# Patient Record
Sex: Male | Born: 1940 | Race: White | Hispanic: No | Marital: Married | Smoking: Current some day smoker
Health system: Southern US, Community
[De-identification: ages and names within clinical notes are randomized; demographics above are authoritative.]

## PROBLEM LIST (undated history)

## (undated) DIAGNOSIS — I219 Acute myocardial infarction, unspecified: Secondary | ICD-10-CM

## (undated) DIAGNOSIS — I10 Essential (primary) hypertension: Secondary | ICD-10-CM

## (undated) DIAGNOSIS — M810 Age-related osteoporosis without current pathological fracture: Secondary | ICD-10-CM

## (undated) DIAGNOSIS — H269 Unspecified cataract: Secondary | ICD-10-CM

## (undated) DIAGNOSIS — T7840XA Allergy, unspecified, initial encounter: Secondary | ICD-10-CM

## (undated) DIAGNOSIS — J449 Chronic obstructive pulmonary disease, unspecified: Secondary | ICD-10-CM

## (undated) HISTORY — DX: Chronic obstructive pulmonary disease, unspecified: J44.9

## (undated) HISTORY — PX: ANGIOPLASTY: SHX39

## (undated) HISTORY — PX: EYE SURGERY: SHX253

## (undated) HISTORY — DX: Unspecified cataract: H26.9

## (undated) HISTORY — DX: Age-related osteoporosis without current pathological fracture: M81.0

## (undated) HISTORY — DX: Acute myocardial infarction, unspecified: I21.9

## (undated) HISTORY — DX: Allergy, unspecified, initial encounter: T78.40XA

## (undated) HISTORY — DX: Essential (primary) hypertension: I10

---
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
## 2004-02-07 ENCOUNTER — Emergency Department: Payer: Self-pay | Admitting: Internal Medicine

## 2004-02-07 ENCOUNTER — Other Ambulatory Visit: Payer: Self-pay

## 2007-07-09 DIAGNOSIS — Z720 Tobacco use: Secondary | ICD-10-CM | POA: Insufficient documentation

## 2007-11-18 ENCOUNTER — Other Ambulatory Visit: Payer: Self-pay

## 2007-11-18 ENCOUNTER — Inpatient Hospital Stay: Payer: Self-pay | Admitting: Internal Medicine

## 2008-02-02 ENCOUNTER — Ambulatory Visit: Payer: Self-pay | Admitting: Gastroenterology

## 2009-04-03 ENCOUNTER — Ambulatory Visit: Payer: Self-pay | Admitting: Family Medicine

## 2009-04-12 ENCOUNTER — Ambulatory Visit: Payer: Self-pay | Admitting: Family Medicine

## 2009-04-26 ENCOUNTER — Ambulatory Visit: Payer: Self-pay | Admitting: Gastroenterology

## 2009-04-27 ENCOUNTER — Ambulatory Visit: Payer: Self-pay | Admitting: Gastroenterology

## 2009-05-02 ENCOUNTER — Ambulatory Visit: Payer: Self-pay | Admitting: Gastroenterology

## 2012-09-30 ENCOUNTER — Ambulatory Visit: Payer: Self-pay | Admitting: Family Medicine

## 2012-10-02 ENCOUNTER — Ambulatory Visit: Payer: Self-pay | Admitting: Family Medicine

## 2012-11-10 ENCOUNTER — Emergency Department: Payer: Self-pay | Admitting: Emergency Medicine

## 2012-11-10 LAB — BASIC METABOLIC PANEL
Anion Gap: 4 — ABNORMAL LOW (ref 7–16)
BUN: 16 mg/dL (ref 7–18)
Calcium, Total: 9 mg/dL (ref 8.5–10.1)
EGFR (African American): >60
EGFR (Non-African Amer.): >60
Sodium: 135 mmol/L — ABNORMAL LOW (ref 136–145)

## 2012-11-10 LAB — CBC
HCT: 47.8 % (ref 40.0–52.0)
HGB: 16.7 g/dL (ref 13.0–18.0)
MCH: 33.6 pg (ref 26.0–34.0)
MCHC: 34.8 g/dL (ref 32.0–36.0)
Platelet: 265 10*3/uL (ref 150–440)

## 2012-11-10 LAB — PROTIME-INR: INR: 1

## 2012-11-10 LAB — CK TOTAL AND CKMB (NOT AT ARMC)
CK, Total: 78 U/L (ref 35–232)
CK-MB: 1.4 ng/mL (ref 0.5–3.6)

## 2012-11-10 LAB — TROPONIN I: Troponin-I: <0.02 ng/mL

## 2014-01-31 LAB — LIPID PANEL
Cholesterol: 177 mg/dL (ref 0–200)
HDL: 59 mg/dL (ref 35–70)
LDL CALC: 99 mg/dL
Triglycerides: 94 mg/dL (ref 40–160)

## 2014-01-31 LAB — CBC AND DIFFERENTIAL
HEMATOCRIT: 17 % — AB (ref 41–53)
HEMOGLOBIN: 16.8 g/dL (ref 13.5–17.5)
PLATELETS: 246 10*3/uL (ref 150–399)
WBC: 6.6 10*3/mL

## 2014-01-31 LAB — BASIC METABOLIC PANEL
BUN: 12 mg/dL (ref 4–21)
Creatinine: 1 mg/dL (ref 0.6–1.3)
GLUCOSE: 83 mg/dL
Potassium: 5.1 mmol/L (ref 3.4–5.3)
SODIUM: 141 mmol/L (ref 137–147)

## 2014-01-31 LAB — HEPATIC FUNCTION PANEL
ALT: 16 U/L (ref 10–40)
AST: 19 U/L (ref 14–40)

## 2014-01-31 LAB — PSA: PSA: 0.5

## 2014-04-05 ENCOUNTER — Ambulatory Visit: Payer: Self-pay | Admitting: Family Medicine

## 2014-06-03 DIAGNOSIS — I272 Pulmonary hypertension, unspecified: Secondary | ICD-10-CM | POA: Insufficient documentation

## 2014-06-03 DIAGNOSIS — I255 Ischemic cardiomyopathy: Secondary | ICD-10-CM | POA: Insufficient documentation

## 2014-08-31 DIAGNOSIS — M41127 Adolescent idiopathic scoliosis, lumbosacral region: Secondary | ICD-10-CM | POA: Diagnosis not present

## 2014-08-31 DIAGNOSIS — M955 Acquired deformity of pelvis: Secondary | ICD-10-CM | POA: Diagnosis not present

## 2014-08-31 DIAGNOSIS — M9905 Segmental and somatic dysfunction of pelvic region: Secondary | ICD-10-CM | POA: Diagnosis not present

## 2014-08-31 DIAGNOSIS — M9903 Segmental and somatic dysfunction of lumbar region: Secondary | ICD-10-CM | POA: Diagnosis not present

## 2014-09-29 DIAGNOSIS — M9905 Segmental and somatic dysfunction of pelvic region: Secondary | ICD-10-CM | POA: Diagnosis not present

## 2014-09-29 DIAGNOSIS — M9903 Segmental and somatic dysfunction of lumbar region: Secondary | ICD-10-CM | POA: Diagnosis not present

## 2014-09-29 DIAGNOSIS — M955 Acquired deformity of pelvis: Secondary | ICD-10-CM | POA: Diagnosis not present

## 2014-09-29 DIAGNOSIS — M41127 Adolescent idiopathic scoliosis, lumbosacral region: Secondary | ICD-10-CM | POA: Diagnosis not present

## 2014-10-26 DIAGNOSIS — M9903 Segmental and somatic dysfunction of lumbar region: Secondary | ICD-10-CM | POA: Diagnosis not present

## 2014-10-26 DIAGNOSIS — M955 Acquired deformity of pelvis: Secondary | ICD-10-CM | POA: Diagnosis not present

## 2014-10-26 DIAGNOSIS — M41127 Adolescent idiopathic scoliosis, lumbosacral region: Secondary | ICD-10-CM | POA: Diagnosis not present

## 2014-10-26 DIAGNOSIS — M9905 Segmental and somatic dysfunction of pelvic region: Secondary | ICD-10-CM | POA: Diagnosis not present

## 2014-11-23 DIAGNOSIS — M9905 Segmental and somatic dysfunction of pelvic region: Secondary | ICD-10-CM | POA: Diagnosis not present

## 2014-11-23 DIAGNOSIS — M41127 Adolescent idiopathic scoliosis, lumbosacral region: Secondary | ICD-10-CM | POA: Diagnosis not present

## 2014-11-23 DIAGNOSIS — M955 Acquired deformity of pelvis: Secondary | ICD-10-CM | POA: Diagnosis not present

## 2014-11-23 DIAGNOSIS — M9903 Segmental and somatic dysfunction of lumbar region: Secondary | ICD-10-CM | POA: Diagnosis not present

## 2014-12-15 DIAGNOSIS — I2510 Atherosclerotic heart disease of native coronary artery without angina pectoris: Secondary | ICD-10-CM | POA: Diagnosis not present

## 2014-12-15 DIAGNOSIS — I10 Essential (primary) hypertension: Secondary | ICD-10-CM | POA: Diagnosis not present

## 2014-12-15 DIAGNOSIS — R630 Anorexia: Secondary | ICD-10-CM | POA: Insufficient documentation

## 2014-12-15 DIAGNOSIS — Z7952 Long term (current) use of systemic steroids: Secondary | ICD-10-CM | POA: Insufficient documentation

## 2014-12-15 DIAGNOSIS — E782 Mixed hyperlipidemia: Secondary | ICD-10-CM | POA: Diagnosis not present

## 2014-12-15 DIAGNOSIS — E7800 Pure hypercholesterolemia, unspecified: Secondary | ICD-10-CM | POA: Insufficient documentation

## 2014-12-15 DIAGNOSIS — J449 Chronic obstructive pulmonary disease, unspecified: Secondary | ICD-10-CM | POA: Insufficient documentation

## 2014-12-15 DIAGNOSIS — S72009A Fracture of unspecified part of neck of unspecified femur, initial encounter for closed fracture: Secondary | ICD-10-CM | POA: Insufficient documentation

## 2014-12-15 DIAGNOSIS — I739 Peripheral vascular disease, unspecified: Secondary | ICD-10-CM | POA: Diagnosis not present

## 2014-12-15 DIAGNOSIS — M810 Age-related osteoporosis without current pathological fracture: Secondary | ICD-10-CM | POA: Insufficient documentation

## 2014-12-15 DIAGNOSIS — T7840XA Allergy, unspecified, initial encounter: Secondary | ICD-10-CM | POA: Insufficient documentation

## 2014-12-15 DIAGNOSIS — N529 Male erectile dysfunction, unspecified: Secondary | ICD-10-CM | POA: Insufficient documentation

## 2014-12-15 DIAGNOSIS — H6980 Other specified disorders of Eustachian tube, unspecified ear: Secondary | ICD-10-CM | POA: Insufficient documentation

## 2014-12-16 ENCOUNTER — Encounter: Payer: Self-pay | Admitting: Family Medicine

## 2014-12-16 ENCOUNTER — Ambulatory Visit (INDEPENDENT_AMBULATORY_CARE_PROVIDER_SITE_OTHER): Payer: Medicare PPO | Admitting: Family Medicine

## 2014-12-16 VITALS — BP 118/74 | HR 92 | Temp 98.1°F | Resp 16 | Ht 65.0 in | Wt 110.0 lb

## 2014-12-16 DIAGNOSIS — I2510 Atherosclerotic heart disease of native coronary artery without angina pectoris: Secondary | ICD-10-CM | POA: Insufficient documentation

## 2014-12-16 DIAGNOSIS — I739 Peripheral vascular disease, unspecified: Secondary | ICD-10-CM | POA: Insufficient documentation

## 2014-12-16 DIAGNOSIS — J441 Chronic obstructive pulmonary disease with (acute) exacerbation: Secondary | ICD-10-CM | POA: Diagnosis not present

## 2014-12-16 DIAGNOSIS — R630 Anorexia: Secondary | ICD-10-CM | POA: Diagnosis not present

## 2014-12-16 MED ORDER — PREDNISONE 10 MG PO TABS: ORAL_TABLET | ORAL | Status: DC

## 2014-12-16 NOTE — Progress Notes (Signed)
Subjective:    Patient ID: Roger Gutierrez, male    DOB: [birth year], 74 y.o.   MRN: [id number]  Shortness of Breath This is a recurrent problem. The current episode started 1 to 4 weeks ago. The problem occurs constantly. The problem has been gradually worsening. Associated symptoms include rhinorrhea and sputum production (clear to white). Pertinent negatives include no abdominal pain, chest pain, claudication, ear pain, fever, headaches, hemoptysis, leg pain, leg swelling, neck pain, orthopnea, rash, sore throat, swollen glands, syncope, vomiting or wheezing. The symptoms are aggravated by weather changes. His past medical history is significant for allergies, chronic lung disease, COPD and a heart failure.      Review of Systems  Constitutional: Negative for fever, chills, diaphoresis, activity change, appetite change and fatigue.  HENT: Positive for rhinorrhea. Negative for ear pain and sore throat.   Respiratory: Positive for sputum production (clear to white) and shortness of breath. Negative for cough, hemoptysis and wheezing.   Cardiovascular: Negative for chest pain, palpitations, orthopnea, claudication, leg swelling and syncope.  Gastrointestinal: Negative for vomiting and abdominal pain.  Musculoskeletal: Negative for neck pain.  Skin: Negative for rash.  Neurological: Negative for headaches.   BP 118/74 mmHg  Pulse 92  Temp(Src) 98.1 F (36.7 C) (Oral)  Resp 16  Ht  (1.651 m)  Wt 110 lb (49.896 kg)  BMI 18.31 kg/m2  SpO2 96%   Patient Active Problem List   Diagnosis Date Noted  . Arteriosclerosis of coronary artery 12/16/2014  . Peripheral blood vessel disorder 12/16/2014  . Allergic reaction 12/15/2014  . Anorexia 12/15/2014  . Long term current use of systemic steroids 12/15/2014  . CAFL (chronic airflow limitation) 12/15/2014  . Dysfunction of eustachian tube 12/15/2014  . Failure of erection 12/15/2014  . Femoral neck fracture 12/15/2014  .  Hypercholesteremia 12/15/2014  . OP (osteoporosis) 12/15/2014  . Hypertensive pulmonary vascular disease 06/03/2014  . Abnormal loss of weight 04/03/2009  . H/O acute poliomyelitis 12/20/2007  . Scoliosis 12/20/2007  . Atherosclerosis of coronary artery 07/09/2007  . Benign hypertension 07/09/2007  . Dermatologic disease 07/09/2007  . Current tobacco use 07/09/2007   Past Medical History  Diagnosis Date  . Allergy   . COPD (chronic obstructive pulmonary disease)   . Osteoporosis   . Hyperlipidemia    Current Outpatient Prescriptions on File Prior to Visit  Medication Sig  . aspirin 81 MG tablet Take 1 tablet by mouth daily.  Marland Kitchen ibandronate (BONIVA) 150 MG tablet Take 1 tablet by mouth daily.  . pravastatin (PRAVACHOL) 40 MG tablet Take 1 tablet by mouth daily.  . ramipril (ALTACE) 5 MG capsule Take 1 capsule by mouth daily.  Marland Kitchen augmented betamethasone dipropionate (DIPROLENE-AF) 0.05 % cream    No current facility-administered medications on file prior to visit.   Allergies  Allergen Reactions  . Megace  [Megestrol] Shortness Of Breath and Palpitations   Past Surgical History  Procedure Laterality Date  . Eye surgery Bilateral 08/2011 and 09/2011    cataract extraction  . Angioplasty      cardiac; 3 stents  . Tonsillectomy and adenoidectomy  1950's  . Ankel fusion Left 1958   Social History   Social History  . Marital Status: Married    Spouse Name: N/A  . Number of Children: N/A  . Years of Education: N/A   Occupational History  . Not on file.   Social History Main Topics  . Smoking status: Former Smoker    Quit date:  03/25/2003  . Smokeless tobacco: Never Used  . Alcohol Use: Yes     Comment: occasional  . Drug Use: No  . Sexual Activity: Not on file   Other Topics Concern  . Not on file   Social History Narrative   Family History  Problem Relation Age of Onset  . Hypertension Mother   . Healthy Sister      .result     Objective:   Physical  Exam  Constitutional: He is oriented to person, place, and time. He appears well-developed and well-nourished.  HENT:  Head: Normocephalic and atraumatic.  Right Ear: Tympanic membrane and external ear normal.  Left Ear: Tympanic membrane and external ear normal.  Nose: Mucosal edema and rhinorrhea present. Right sinus exhibits no maxillary sinus tenderness. Left sinus exhibits no maxillary sinus tenderness.  Mouth/Throat: Uvula is midline and oropharynx is clear and moist.  Eyes: Conjunctivae and EOM are normal. Pupils are equal, round, and reactive to light.  Neck: Normal range of motion. Neck supple.  Cardiovascular: Normal rate and regular rhythm.   Pulmonary/Chest: Effort normal and breath sounds normal. He has no wheezes. He has no rales.  Neurological: He is alert and oriented to person, place, and time.  BP 118/74 mmHg  Pulse 92  Temp(Src) 98.1 F (36.7 C) (Oral)  Resp 16  Ht  (1.651 m)  Wt 110 lb (49.896 kg)  BMI 18.31 kg/m2  SpO2 96%      Assessment & Plan:  1. COPD exacerbation Worsening. Will treat and call if worsening or does not improve.   - predniSONE (DELTASONE) 10 MG tablet; 6 po for 2 days and then 5 po for 2 days and then 4 po for 2 days and 3 po for 2 days and then 2 po for 2 days and then 1 po for 2 days.  Dispense: 42 tablet; Refill: 0  2. Anorexia Is loosing weight again. Will treat with short term prednisone. Reassess and several weeks.    Lorie Phenix, MD

## 2014-12-21 DIAGNOSIS — M955 Acquired deformity of pelvis: Secondary | ICD-10-CM | POA: Diagnosis not present

## 2014-12-21 DIAGNOSIS — M41127 Adolescent idiopathic scoliosis, lumbosacral region: Secondary | ICD-10-CM | POA: Diagnosis not present

## 2014-12-21 DIAGNOSIS — M9903 Segmental and somatic dysfunction of lumbar region: Secondary | ICD-10-CM | POA: Diagnosis not present

## 2014-12-21 DIAGNOSIS — M9905 Segmental and somatic dysfunction of pelvic region: Secondary | ICD-10-CM | POA: Diagnosis not present

## 2015-01-13 ENCOUNTER — Encounter: Payer: Self-pay | Admitting: Family Medicine

## 2015-01-13 ENCOUNTER — Ambulatory Visit (INDEPENDENT_AMBULATORY_CARE_PROVIDER_SITE_OTHER): Payer: Medicare PPO | Admitting: Family Medicine

## 2015-01-13 VITALS — BP 100/76 | HR 96 | Temp 98.0°F | Resp 16 | Wt 111.0 lb

## 2015-01-13 DIAGNOSIS — R630 Anorexia: Secondary | ICD-10-CM | POA: Diagnosis not present

## 2015-01-13 DIAGNOSIS — Z720 Tobacco use: Secondary | ICD-10-CM | POA: Diagnosis not present

## 2015-01-13 DIAGNOSIS — Z23 Encounter for immunization: Secondary | ICD-10-CM

## 2015-01-13 DIAGNOSIS — F17290 Nicotine dependence, other tobacco product, uncomplicated: Secondary | ICD-10-CM

## 2015-01-13 DIAGNOSIS — R634 Abnormal weight loss: Secondary | ICD-10-CM | POA: Diagnosis not present

## 2015-01-13 DIAGNOSIS — Z7952 Long term (current) use of systemic steroids: Secondary | ICD-10-CM

## 2015-01-13 MED ORDER — PREDNISONE 20 MG PO TABS: 20.0000 mg | ORAL_TABLET | Freq: Every day | ORAL | Status: DC

## 2015-01-13 NOTE — Progress Notes (Signed)
Subjective:    Patient ID: Roger Gutierrez, male    DOB: 1940/12/28, 74 y.o.   MRN: [id number]  HPI  Weight Loss Pt was seen on 12/16/2014 for anorexia. Pt was started on a Prednisone 12 day taper. Pt weighed in at 110 lbs at LOV. Pt reports the Prednisone helped him reach 113 lbs, but quickly started losing again. Pt is now 111. Pt is requesting to restart the Prednisone. States it is the only treatment that seems to help this problem. Also pt is planning to visit son in the [place], [place] from around Thanksgiving time to January. Is requesting a 3 month supply on medications.  20 mg really helps him. 10 just maintains. Understands risks but would like to proceed. Just does so much better on medication.   Review of Systems  Constitutional: Positive for appetite change and unexpected weight change. Negative for fever, chills, diaphoresis, activity change and fatigue.  Respiratory: Negative for cough, shortness of breath and wheezing.   Cardiovascular: Negative for chest pain, palpitations and leg swelling.   BP 100/76 mmHg  Pulse 96  Temp(Src) 98 F (36.7 C) (Oral)  Resp 16  Wt 111 lb (50.349 kg)   Patient Active Problem List   Diagnosis Date Noted  . Arteriosclerosis of coronary artery 12/16/2014  . Peripheral blood vessel disorder (HCC) 12/16/2014  . Allergic reaction 12/15/2014  . Anorexia 12/15/2014  . Long term current use of systemic steroids 12/15/2014  . CAFL (chronic airflow limitation) (HCC) 12/15/2014  . Dysfunction of eustachian tube 12/15/2014  . Failure of erection 12/15/2014  . Femoral neck fracture (HCC) 12/15/2014  . Hypercholesteremia 12/15/2014  . OP (osteoporosis) 12/15/2014  . Hypertensive pulmonary vascular disease (HCC) 06/03/2014  . Abnormal loss of weight 04/03/2009  . H/O acute poliomyelitis 12/20/2007  . Scoliosis 12/20/2007  . Atherosclerosis of coronary artery 07/09/2007  . Benign hypertension 07/09/2007  . Dermatologic disease 07/09/2007  . Current  tobacco use 07/09/2007   Past Medical History  Diagnosis Date  . Allergy   . COPD (chronic obstructive pulmonary disease) (HCC)   . Osteoporosis   . Hyperlipidemia    Current Outpatient Prescriptions on File Prior to Visit  Medication Sig  . aspirin 81 MG tablet Take 1 tablet by mouth daily.  Marland Kitchen augmented betamethasone dipropionate (DIPROLENE-AF) 0.05 % cream   . ibandronate (BONIVA) 150 MG tablet Take 1 tablet by mouth daily.  . pravastatin (PRAVACHOL) 40 MG tablet Take 1 tablet by mouth daily.  . ramipril (ALTACE) 5 MG capsule Take 1 capsule by mouth daily.   No current facility-administered medications on file prior to visit.   Allergies  Allergen Reactions  . Megace  [Megestrol] Shortness Of Breath and Palpitations   Past Surgical History  Procedure Laterality Date  . Eye surgery Bilateral 08/2011 and 09/2011    cataract extraction  . Angioplasty      cardiac; 3 stents  . Tonsillectomy and adenoidectomy  1950's  . Ankel fusion Left 1958   Social History   Social History  . Marital Status: Married    Spouse Name: N/A  . Number of Children: N/A  . Years of Education: N/A   Occupational History  . Not on file.   Social History Main Topics  . Smoking status: Former Smoker    Quit date: 03/25/2003  . Smokeless tobacco: Never Used  . Alcohol Use: Yes     Comment: occasional  . Drug Use: No  . Sexual Activity: Not on file  Other Topics Concern  . Not on file   Social History Narrative   Family History  Problem Relation Age of Onset  . Hypertension Mother   . Healthy Sister       Objective:   Physical Exam  Constitutional: He is oriented to person, place, and time. He appears well-developed and well-nourished.  Cardiovascular: Normal rate and regular rhythm.   Pulmonary/Chest: Effort normal and breath sounds normal.  Musculoskeletal:  Marked kyphosis  Noted.    Neurological: He is alert and oriented to person, place, and time.   BP 100/76 mmHg  Pulse  96  Temp(Src) 98 F (36.7 C) (Oral)  Resp 16  Wt 111 lb (50.349 kg)        Assessment & Plan:  1. Flu vaccine need Given today.  - Flu vaccine HIGH DOSE PF  2. Abnormal loss of weight Has had negative work up. Reviewed today.    3. Anorexia Does eat, but does not gain weight.   4. Cigar smoker Several times a week.   5. Long term current use of systemic steroids Will restart steroids secondary to weight loss again. Will continue bisphosphonate also and follow up in 3 months.   - predniSONE (DELTASONE) 20 MG tablet; Take 1 tablet (20 mg total) by mouth daily with breakfast.  Dispense: 90 tablet; Refill: 0   Lorie PhenixNancy Armella Stogner, MD

## 2015-01-18 DIAGNOSIS — M955 Acquired deformity of pelvis: Secondary | ICD-10-CM | POA: Diagnosis not present

## 2015-01-18 DIAGNOSIS — M9903 Segmental and somatic dysfunction of lumbar region: Secondary | ICD-10-CM | POA: Diagnosis not present

## 2015-01-18 DIAGNOSIS — M9905 Segmental and somatic dysfunction of pelvic region: Secondary | ICD-10-CM | POA: Diagnosis not present

## 2015-01-18 DIAGNOSIS — M41127 Adolescent idiopathic scoliosis, lumbosacral region: Secondary | ICD-10-CM | POA: Diagnosis not present

## 2015-02-15 ENCOUNTER — Encounter: Payer: Self-pay | Admitting: Family Medicine

## 2015-02-15 DIAGNOSIS — M955 Acquired deformity of pelvis: Secondary | ICD-10-CM | POA: Diagnosis not present

## 2015-02-15 DIAGNOSIS — M41127 Adolescent idiopathic scoliosis, lumbosacral region: Secondary | ICD-10-CM | POA: Diagnosis not present

## 2015-02-15 DIAGNOSIS — M9905 Segmental and somatic dysfunction of pelvic region: Secondary | ICD-10-CM | POA: Diagnosis not present

## 2015-02-15 DIAGNOSIS — M9903 Segmental and somatic dysfunction of lumbar region: Secondary | ICD-10-CM | POA: Diagnosis not present

## 2015-02-17 ENCOUNTER — Encounter: Payer: Self-pay | Admitting: Family Medicine

## 2015-02-17 ENCOUNTER — Ambulatory Visit (INDEPENDENT_AMBULATORY_CARE_PROVIDER_SITE_OTHER): Payer: Medicare PPO | Admitting: Family Medicine

## 2015-02-17 VITALS — BP 118/68 | Temp 98.3°F | Ht 65.0 in | Wt 117.0 lb

## 2015-02-17 DIAGNOSIS — J441 Chronic obstructive pulmonary disease with (acute) exacerbation: Secondary | ICD-10-CM | POA: Diagnosis not present

## 2015-02-17 MED ORDER — ALBUTEROL SULFATE HFA 108 (90 BASE) MCG/ACT IN AERS: 2.0000 | INHALATION_SPRAY | Freq: Four times a day (QID) | RESPIRATORY_TRACT | Status: DC | PRN

## 2015-02-17 MED ORDER — PREDNISONE 10 MG PO TABS: ORAL_TABLET | ORAL | Status: DC

## 2015-02-17 NOTE — Progress Notes (Signed)
Patient ID: Roger Gutierrez, Gutierrez   DOB: 1940-11-16, 74 y.o.   MRN: [id number]       Patient: Roger Gutierrez    DOB: 1940-11-16   74 y.o.   MRN: [id number] Visit Date: 02/17/2015  Today's Provider: Lorie PhenixNancy Yelina Sarratt, MD   Chief Complaint  Patient presents with  . Shortness of Breath    since yesterday.    Subjective:    Shortness of Breath This is a recurrent problem. The current episode started yesterday. The problem occurs intermittently. The problem has been unchanged. Pertinent negatives include no chest pain, fever, headaches, leg pain, leg swelling, neck pain, PND, rash or wheezing. The symptoms are aggravated by any activity. He has tried nothing for the symptoms. His past medical history is significant for COPD.  Patient reports that his symptoms came on all of a sudden. He reports that his symptoms were better once he layed down, but as soo as he sat up, it was difficult for him to breath.   Was some better yesterday. Does feel even better today.  Took 80 of prednisone yesterday and 60 mg today.  Was like an allergic reaction.  Has had something similar before.    Was raking leaves and up under the house before this started.       Allergies  Allergen Reactions  . Megace  [Megestrol] Shortness Of Breath and Palpitations   Previous Medications   ASPIRIN 81 MG TABLET    Take 1 tablet by mouth daily.   AUGMENTED BETAMETHASONE DIPROPIONATE (DIPROLENE-AF) 0.05 % CREAM       IBANDRONATE (BONIVA) 150 MG TABLET    Take 1 tablet by mouth daily.   IBUPROFEN (ADVIL,MOTRIN) 200 MG TABLET    Take 400 mg by mouth as needed.   PRAVASTATIN (PRAVACHOL) 40 MG TABLET    Take 1 tablet by mouth daily.   PREDNISONE (DELTASONE) 20 MG TABLET    Take 1 tablet (20 mg total) by mouth daily with breakfast.   RAMIPRIL (ALTACE) 5 MG CAPSULE    Take 1 capsule by mouth daily.    Review of Systems  Constitutional: Negative.  Negative for fever.  Respiratory: Positive for shortness of breath. Negative  for wheezing.   Cardiovascular: Negative for chest pain, leg swelling and PND.  Musculoskeletal: Negative for neck pain.  Skin: Negative for rash.  Neurological: Negative.  Negative for headaches.    Social History  Substance Use Topics  . Smoking status: Former Smoker    Quit date: 03/25/2003  . Smokeless tobacco: Never Used  . Alcohol Use: Yes     Comment: occasional   Objective:   BP 118/68 mmHg  Temp(Src) 98.3 F (36.8 C)  Ht 5\' 5"  (1.651 m)  Wt 117 lb (53.071 kg)  BMI 19.47 kg/m2  Physical Exam  Constitutional: He is oriented to person, place, and time. He appears well-developed and well-nourished.  Cardiovascular: Normal rate and regular rhythm.   Pulmonary/Chest: Effort normal.  Decreased breath sounds.   ALso, marked kyphosis and scoliosis noted.    Neurological: He is alert and oriented to person, place, and time.  Psychiatric: He has a normal mood and affect. His behavior is normal. Judgment and thought content normal.      Assessment & Plan:     1. COPD exacerbation (HCC) New problem. Worsening. Start medication. Patient instructed to call back if condition worsens or does not improve.    - predniSONE (DELTASONE) 10 MG tablet; 6 po for 2 days  and then 5 po for 2 days and then 4 po for 2 days and 3 po for 2 days and then 2 po for 2 days and then 1 po for 2 days.  Dispense: 42 tablet; Refill: 0 - albuterol (PROVENTIL HFA;VENTOLIN HFA) 108 (90 BASE) MCG/ACT inhaler; Inhale 2 puffs into the lungs every 6 (six) hours as needed for wheezing or shortness of breath.  Dispense: 1 Inhaler; Refill: 2     Lorie Phenix, MD  [facility]

## 2015-04-14 ENCOUNTER — Ambulatory Visit: Payer: Medicare PPO | Admitting: Family Medicine

## 2015-05-09 ENCOUNTER — Ambulatory Visit (INDEPENDENT_AMBULATORY_CARE_PROVIDER_SITE_OTHER): Payer: Medicare Other | Admitting: Family Medicine

## 2015-05-09 ENCOUNTER — Encounter: Payer: Self-pay | Admitting: Family Medicine

## 2015-05-09 VITALS — BP 118/70 | HR 88 | Temp 97.7°F | Resp 20 | Wt 113.0 lb

## 2015-05-09 DIAGNOSIS — R634 Abnormal weight loss: Secondary | ICD-10-CM

## 2015-05-09 DIAGNOSIS — J449 Chronic obstructive pulmonary disease, unspecified: Secondary | ICD-10-CM

## 2015-05-09 MED ORDER — PREDNISONE 10 MG PO TABS: 10.0000 mg | ORAL_TABLET | Freq: Every day | ORAL | Status: DC

## 2015-05-09 MED ORDER — MIRTAZAPINE 15 MG PO TABS: 15.0000 mg | ORAL_TABLET | Freq: Every day | ORAL | Status: DC

## 2015-05-09 NOTE — Progress Notes (Signed)
Subjective:    Patient ID: Roger Gutierrez, male    DOB: 09-Aug-1940, 75 y.o.   MRN: [id number]  HPI  Weight Loss Pt was seen on 01/13/2015 for anorexia. Pt was started on a Prednisone 20 mg for 3 months. Pt weighed in at 111 lbs at LOV. Pt reports the Prednisone helped him reach around 120 lbs, but quickly started losing again. Pt is now 113. Pt has run out of Prednisone prescription 2 weeks ago. States it is the only treatment that seems to help this problem.   Review of Systems  Constitutional: Positive for unexpected weight change. Negative for fever, chills, diaphoresis, activity change, appetite change and fatigue.  Respiratory: Negative for cough, shortness of breath and wheezing.   Cardiovascular: Negative for chest pain, palpitations and leg swelling.   BP 118/70 mmHg  Pulse 88  Temp(Src) 97.7 F (36.5 C) (Oral)  Resp 20  Wt 113 lb (51.256 kg)   Patient Active Problem List   Diagnosis Date Noted  . Cigar smoker 01/13/2015  . Arteriosclerosis of coronary artery 12/16/2014  . Anorexia 12/15/2014  . Long term current use of systemic steroids 12/15/2014  . CAFL (chronic airflow limitation) (HCC) 12/15/2014  . Failure of erection 12/15/2014  . Hypercholesteremia 12/15/2014  . OP (osteoporosis) 12/15/2014  . Abnormal loss of weight 04/03/2009  . H/O acute poliomyelitis 12/20/2007  . Scoliosis 12/20/2007  . Benign hypertension 07/09/2007   Past Medical History  Diagnosis Date  . Allergy   . COPD (chronic obstructive pulmonary disease) (HCC)   . Osteoporosis   . Hyperlipidemia    Current Outpatient Prescriptions on File Prior to Visit  Medication Sig  . albuterol (PROVENTIL HFA;VENTOLIN HFA) 108 (90 BASE) MCG/ACT inhaler Inhale 2 puffs into the lungs every 6 (six) hours as needed for wheezing or shortness of breath.  Marland Kitchen aspirin 81 MG tablet Take 1 tablet by mouth daily.  Marland Kitchen ibandronate (BONIVA) 150 MG tablet Take 1 tablet by mouth daily.  Marland Kitchen ibuprofen (ADVIL,MOTRIN) 200  MG tablet Take 400 mg by mouth as needed.  . pravastatin (PRAVACHOL) 40 MG tablet Take 1 tablet by mouth daily.  . ramipril (ALTACE) 5 MG capsule Take 1 capsule by mouth daily.  . predniSONE (DELTASONE) 20 MG tablet Take 1 tablet (20 mg total) by mouth daily with breakfast. (Patient not taking: Reported on 05/09/2015)   No current facility-administered medications on file prior to visit.   Allergies  Allergen Reactions  . Megace  [Megestrol] Shortness Of Breath and Palpitations   Past Surgical History  Procedure Laterality Date  . Eye surgery Bilateral 08/2011 and 09/2011    cataract extraction  . Angioplasty      cardiac; 3 stents  . Tonsillectomy and adenoidectomy  1950's  . Ankel fusion Left 1958   Social History   Social History  . Marital Status: Married    Spouse Name: N/A  . Number of Children: N/A  . Years of Education: N/A   Occupational History  . Not on file.   Social History Main Topics  . Smoking status: Current Some Day Smoker    Types: Cigars    Last Attempt to Quit: 03/25/2003  . Smokeless tobacco: Never Used  . Alcohol Use: Yes     Comment: occasional  . Drug Use: No  . Sexual Activity: Not on file     Comment: 1 cigar daily   Other Topics Concern  . Not on file   Social History Narrative   Family  History  Problem Relation Age of Onset  . Hypertension Mother   . Healthy Sister       Objective:   Physical Exam  Constitutional: He is oriented to person, place, and time. He appears well-developed and well-nourished.  Cardiovascular: Normal rate and regular rhythm.   Pulmonary/Chest: Effort normal and breath sounds normal.  Musculoskeletal:  Marked kyphosis  Noted.    Neurological: He is alert and oriented to person, place, and time.   BP 118/70 mmHg  Pulse 88  Temp(Src) 97.7 F (36.5 C) (Oral)  Resp 20  Wt 113 lb (51.256 kg)     Assessment & Plan:  1. Abnormal loss of weight Will start Prednisone 10 mg and add Mirtazapine. Will recheck  in 2 months.   - mirtazapine (REMERON) 15 MG tablet; Take 1 tablet (15 mg total) by mouth at bedtime. 1/2 a night for 7 day and then 1 at night.  Dispense: 30 tablet; Refill: 5  2. Chronic obstructive pulmonary disease, unspecified COPD type (HCC) Restart Prednisone secondary to weight loss.   Has been decreasing since stopped Prednisone. Will restart and also continue Boniva. Recheck in 2 months.   - PredniSONE (DELTASONE) 10 MG tablet; Take 1 tablet (10 mg total) by mouth daily with breakfast.  Dispense: 90 tablet; Refill: 0   Patient was seen and examined by Leo Grosser, MD, and note scribed by Allene Dillon, CMA. I have reviewed the document for accuracy and completeness and I agree with above. Leo Grosser, MD   Lorie Phenix, MD

## 2015-05-15 ENCOUNTER — Ambulatory Visit: Payer: Medicare PPO | Admitting: Family Medicine

## 2015-05-16 ENCOUNTER — Other Ambulatory Visit: Payer: Self-pay | Admitting: Family Medicine

## 2015-05-16 DIAGNOSIS — M810 Age-related osteoporosis without current pathological fracture: Secondary | ICD-10-CM

## 2015-05-16 MED ORDER — IBANDRONATE SODIUM 150 MG PO TABS: 150.0000 mg | ORAL_TABLET | ORAL | Status: DC

## 2015-05-16 NOTE — Telephone Encounter (Signed)
Pt contacted office for refill request on the following medications:  ibandronate (BONIVA) 150 MG tablet.  [employer].  [phone]

## 2015-06-07 IMAGING — CR DG CHEST 2V
1 series · 2 of 2 positions shown · non-contrast
Comparison: none

REASON FOR EXAM: weight loss short of breath cough
COMMENTS:

PROCEDURE:     KDR - KDXR CHEST PA (OR AP) AND LAT  - September 30, 2012  [DATE]
RESULT:     Comparison is made to prior study dated 04/03/2009.

[Series 1: pa · 0.17mm/px · 2 of 2 slices shown]
[im 1/2]
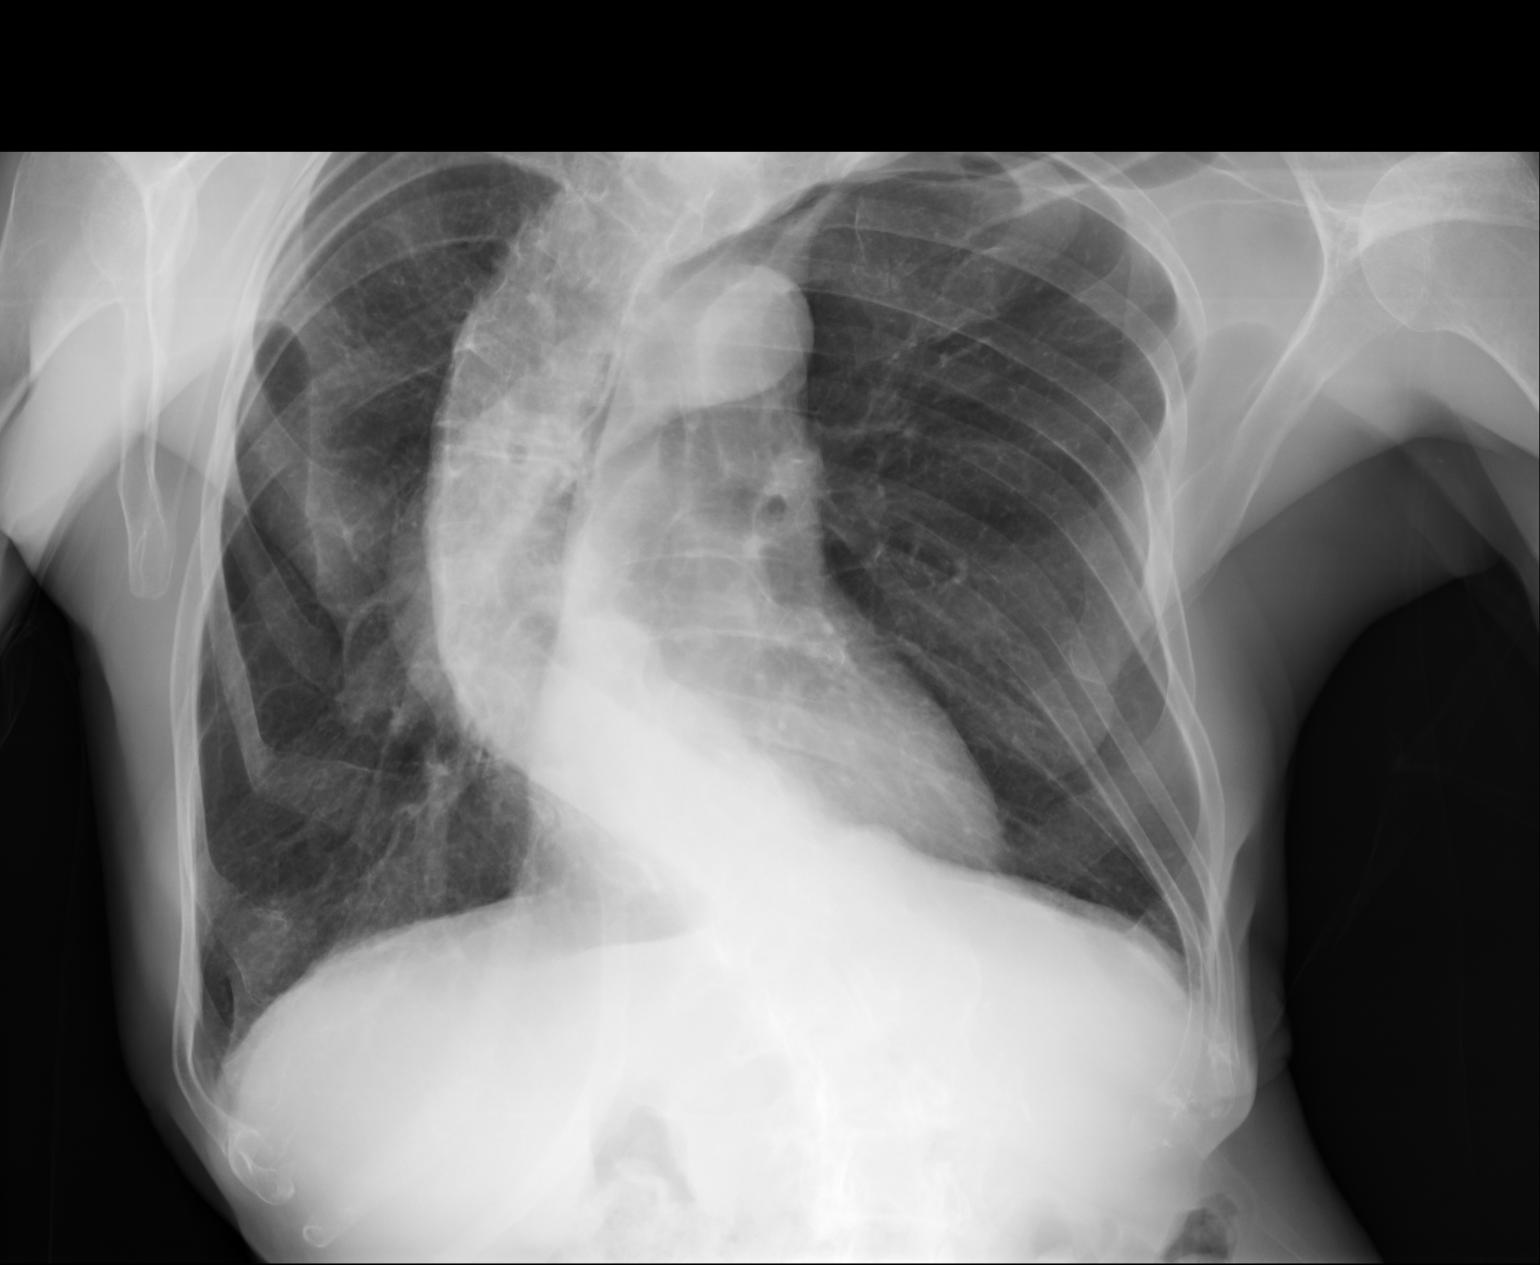
[im 2/2]
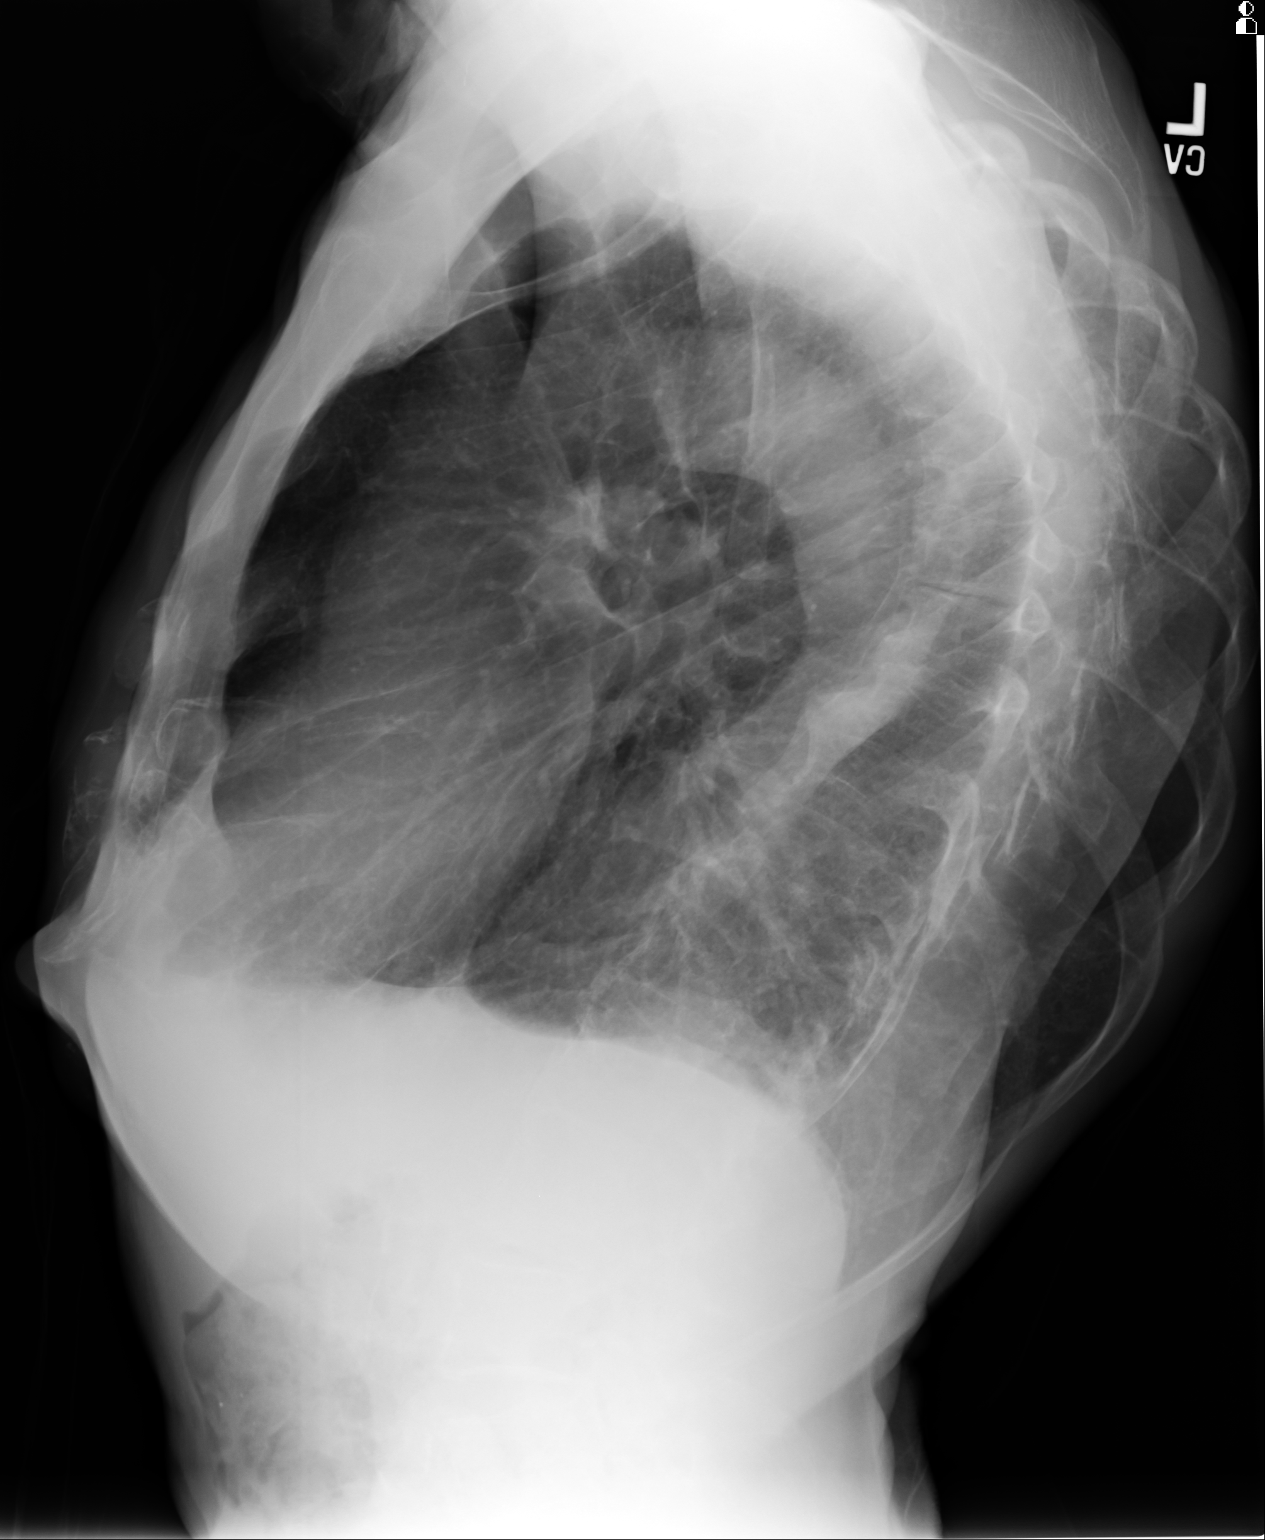

[2 of 2 positions shown; findings below may reference images not displayed]

FINDINGS: There is flattening of the hemidiaphragms. No focal regions of
consolidation or focal infiltrates are appreciated. Severe S-shaped
scoliosis is identified within the thoracolumbar spine. The cardiac
silhouette is within normal limits.
IMPRESSION: 1. COPD without evidence of acute cardiopulmonary disease.

## 2015-07-10 ENCOUNTER — Ambulatory Visit: Payer: Medicare Other | Admitting: Family Medicine

## 2015-07-19 ENCOUNTER — Encounter: Payer: Self-pay | Admitting: Family Medicine

## 2015-07-19 ENCOUNTER — Ambulatory Visit (INDEPENDENT_AMBULATORY_CARE_PROVIDER_SITE_OTHER): Payer: Medicare Other | Admitting: Family Medicine

## 2015-07-19 VITALS — BP 114/72 | HR 88 | Temp 98.5°F | Resp 16 | Ht 65.0 in | Wt 112.0 lb

## 2015-07-19 DIAGNOSIS — R634 Abnormal weight loss: Secondary | ICD-10-CM

## 2015-07-19 DIAGNOSIS — J449 Chronic obstructive pulmonary disease, unspecified: Secondary | ICD-10-CM

## 2015-07-19 MED ORDER — PREDNISONE 10 MG PO TABS: 10.0000 mg | ORAL_TABLET | Freq: Every day | ORAL | Status: DC

## 2015-07-19 NOTE — Progress Notes (Signed)
Patient ID: Roger Gutierrez, male   DOB: Dec 06, 1940, 75 y.o.   MRN: [id number]        Patient: Roger Gutierrez Male    DOB: February 14, 1941   75 y.o.   MRN: [id number] Visit Date: 07/19/2015  Today's Provider: Margarita Rana, MD   Chief Complaint  Patient presents with  . Weight Loss   Subjective:    HPI  Weight Loss  Pt comes in today to follow up with weight loss.  He started Mirtazapine and prednisone at last OV.  He reports tolerating the medications well.  His weight was as high as 117-118 pounds at home, but started losing weight again once he ran out of prednisone.  He was hoping that the Mirtazapine would keep his weight stable however he has lost 5-6 pounds again in the last two weeks since stopping prednisone.    Pt has had a lot of stress for the past few months.  His wife just had major surgery, (She had a tear in her intestine) and was in the hospital for five weeks. Her memory has also worsening.  He is worried about not being able to take home ([facility] is currently at [facility].)         Allergies  Allergen Reactions  . Megace  [Megestrol] Shortness Of Breath and Palpitations   Previous Medications   ALBUTEROL (PROVENTIL HFA;VENTOLIN HFA) 108 (90 BASE) MCG/ACT INHALER    Inhale 2 puffs into the lungs every 6 (six) hours as needed for wheezing or shortness of breath.   ASPIRIN 81 MG TABLET    Take 1 tablet by mouth daily.   IBANDRONATE (BONIVA) 150 MG TABLET    Take 1 tablet (150 mg total) by mouth every 30 (thirty) days.   IBUPROFEN (ADVIL,MOTRIN) 200 MG TABLET    Take 400 mg by mouth as needed.   MIRTAZAPINE (REMERON) 15 MG TABLET    Take 1 tablet (15 mg total) by mouth at bedtime. 1/2 a night for 7 day and then 1 at night.   PRAVASTATIN (PRAVACHOL) 40 MG TABLET    Take 1 tablet by mouth daily.   PREDNISONE (DELTASONE) 10 MG TABLET    Take 1 tablet (10 mg total) by mouth daily with breakfast.   RAMIPRIL (ALTACE) 5 MG CAPSULE    Take 1 capsule by mouth daily.     Review of Systems  Constitutional: Positive for unexpected weight change. Negative for fever, chills, diaphoresis, activity change, appetite change and fatigue.  Respiratory: Negative.        Pt reports his COPD is under good control.   Cardiovascular: Negative.   Gastrointestinal: Negative.   Neurological: Negative for dizziness, light-headedness and headaches.    Social History  Substance Use Topics  . Smoking status: Current Some Day Smoker    Types: Cigars    Last Attempt to Quit: 03/25/2003  . Smokeless tobacco: Never Used  . Alcohol Use: Yes     Comment: occasional   Objective:   BP 114/72 mmHg  Pulse 88  Temp(Src) 98.5 F (36.9 C) (Oral)  Resp 16  Ht '5\' 5"'  (1.651 m)  Wt 112 lb (50.803 kg)  BMI 18.64 kg/m2  Physical Exam  Constitutional: He appears well-developed and well-nourished.  Skin: Skin is warm and dry.  Psychiatric: He has a normal mood and affect. His behavior is normal. Judgment and thought content normal.        Assessment & Plan:     1. Abnormal loss of  weight Worsening since running out of prednisone.  Pt feels much better on prednisone, advised pt was possible long term side effects of prednisone.  He feels the benefits outweigh the risks.  Will restart.  Pt also advised to stay on Mirtazapine for now secondary to his recent stressors with illness of his wife. Will follow up with Dr. Caryn Section who met patient today.    2. Chronic obstructive pulmonary disease, unspecified COPD type ([place]) Stable.   - predniSONE (DELTASONE) 10 MG tablet; Take 1 tablet (10 mg total) by mouth daily with breakfast.  Dispense: 90 tablet; Refill: 1  Patient was seen and examined by Jerrell Belfast, MD, and note scribed by Ashley Royalty, CMA.  I have reviewed the document for accuracy and completeness and I agree with above. - Jerrell Belfast, MD        Margarita Rana, MD  [facility]

## 2015-07-27 ENCOUNTER — Telehealth: Payer: Self-pay | Admitting: Family Medicine

## 2015-07-27 DIAGNOSIS — R634 Abnormal weight loss: Secondary | ICD-10-CM

## 2015-07-27 NOTE — Telephone Encounter (Signed)
Talked with patient.  Recommended rework up of his weight loss.

## 2015-07-27 NOTE — Telephone Encounter (Signed)
Patient will consider repeat abdomen and chest CT. Will call if wants to schedule. Thanks.

## 2016-02-19 ENCOUNTER — Encounter: Payer: Medicare Other | Admitting: Family Medicine

## 2016-03-07 ENCOUNTER — Telehealth: Payer: Self-pay | Admitting: Family Medicine

## 2016-03-07 NOTE — Telephone Encounter (Signed)
Called Pt to schedule AWV with [facility] for 2pm on 1/3 - knb

## 2016-03-27 ENCOUNTER — Ambulatory Visit (INDEPENDENT_AMBULATORY_CARE_PROVIDER_SITE_OTHER): Payer: Medicare Other

## 2016-03-27 ENCOUNTER — Ambulatory Visit (INDEPENDENT_AMBULATORY_CARE_PROVIDER_SITE_OTHER): Payer: Medicare Other | Admitting: Family Medicine

## 2016-03-27 VITALS — BP 99/74 | HR 72 | Temp 97.7°F | Ht 65.0 in | Wt 111.0 lb

## 2016-03-27 DIAGNOSIS — I10 Essential (primary) hypertension: Secondary | ICD-10-CM

## 2016-03-27 DIAGNOSIS — Z7952 Long term (current) use of systemic steroids: Secondary | ICD-10-CM

## 2016-03-27 DIAGNOSIS — J449 Chronic obstructive pulmonary disease, unspecified: Secondary | ICD-10-CM

## 2016-03-27 DIAGNOSIS — E7800 Pure hypercholesterolemia, unspecified: Secondary | ICD-10-CM | POA: Diagnosis not present

## 2016-03-27 DIAGNOSIS — I2510 Atherosclerotic heart disease of native coronary artery without angina pectoris: Secondary | ICD-10-CM

## 2016-03-27 DIAGNOSIS — M810 Age-related osteoporosis without current pathological fracture: Secondary | ICD-10-CM | POA: Diagnosis not present

## 2016-03-27 DIAGNOSIS — Z0000 Encounter for general adult medical examination without abnormal findings: Secondary | ICD-10-CM | POA: Diagnosis not present

## 2016-03-27 DIAGNOSIS — J441 Chronic obstructive pulmonary disease with (acute) exacerbation: Secondary | ICD-10-CM | POA: Diagnosis not present

## 2016-03-27 MED ORDER — ALBUTEROL SULFATE HFA 108 (90 BASE) MCG/ACT IN AERS: 2.0000 | INHALATION_SPRAY | Freq: Four times a day (QID) | RESPIRATORY_TRACT | 2 refills | Status: DC | PRN

## 2016-03-27 NOTE — Progress Notes (Signed)
Patient: Roger Gutierrez Male    DOB: 05/17/1940   76 y.o.   MRN: [id number] Visit Date: 03/27/2016  Today's Provider: [name], MD   Chief Complaint  Patient presents with  . Follow-up  . Hyperlipidemia   Subjective:    HPI  This is a previous patient of Dr. Elease Hashimoto present today as new patient to me to establish care and follow up on chronic medical problems.   Chronic obstructive pulmonary disease, unspecified COPD type (HCC) From 07/19/2015-(seen by Dr. Elease Hashimoto) Stable. Rx for predniSONE was prescribed. Marland Kitchen He takes albuterol occasionally. Last spirometry was normal.    Abnormal loss of weight From 07/19/2015-Pt also advised to stay on Mirtazapine. Had previously been prescribed prednisone for COPD exacerbation but it seemed to help gain back weight.     Lipid/Cholesterol, Follow-up:   Last seen for this 2 months ago.  Management since that visit includes; no changes.  Last Lipid Panel:    Component Value Date/Time   CHOL 177 01/31/2014   TRIG 94 01/31/2014   HDL 59 01/31/2014   LDLCALC 99 01/31/2014    He reports good compliance with treatment. He is not having side effects. none  Wt Readings from Last 3 Encounters:  03/27/16 111 lb (50.3 kg)  07/19/15 112 lb (50.8 kg)  05/09/15 113 lb (51.3 kg)    ----------------------------------------------------------------    Allergies  Allergen Reactions  . Megace  [Megestrol] Shortness Of Breath and Palpitations     Current Outpatient Prescriptions:  .  albuterol (PROVENTIL HFA;VENTOLIN HFA) 108 (90 BASE) MCG/ACT inhaler, Inhale 2 puffs into the lungs every 6 (six) hours as needed for wheezing or shortness of breath., Disp: 1 Inhaler, Rfl: 2 .  aspirin 81 MG tablet, Take 1 tablet by mouth daily., Disp: , Rfl:  .  ibandronate (BONIVA) 150 MG tablet, Take 1 tablet (150 mg total) by mouth every 30 (thirty) days., Disp: 3 tablet, Rfl: 3 .  ibuprofen (ADVIL,MOTRIN) 200 MG tablet, Take 400 mg by mouth as  needed., Disp: , Rfl:  .  mirtazapine (REMERON) 15 MG tablet, Take 1 tablet (15 mg total) by mouth at bedtime. 1/2 a night for 7 day and then 1 at night. (Patient not taking: Reported on 03/27/2016), Disp: 30 tablet, Rfl: 5 .  pravastatin (PRAVACHOL) 40 MG tablet, Take 1 tablet by mouth daily., Disp: , Rfl:  .  pravastatin (PRAVACHOL) 80 MG tablet, Take 80 mg by mouth daily., Disp: , Rfl:  .  predniSONE (DELTASONE) 10 MG tablet, Take 1 tablet (10 mg total) by mouth daily with breakfast., Disp: 90 tablet, Rfl: 1 .  ramipril (ALTACE) 5 MG capsule, Take 1 capsule by mouth daily., Disp: , Rfl:   Review of Systems  Constitutional: Negative for appetite change, chills and fever.  Respiratory: Positive for shortness of breath. Negative for chest tightness and wheezing.   Cardiovascular: Negative for chest pain and palpitations.  Gastrointestinal: Negative for abdominal pain, nausea and vomiting.  All other systems reviewed and are negative.   Social History  Substance Use Topics  . Smoking status: Current Some Day Smoker    Types: Cigars    Last attempt to quit: 03/25/2003  . Smokeless tobacco: Never Used  . Alcohol use Yes     Comment: occasionally beer   Objective:    BP 114/72 mmHg  Pulse 88  Temp(Src) 98.5 F (36.9 C) (Oral)  Resp 16  Ht 5\' 5"  (1.651 m)  Wt 112 lb (  50.803 kg)  BMI 18.64 kg/m2  Physical Exam    General Appearance:    Alert, cooperative, no distress  Eyes:    PERRL, conjunctiva/corneas clear, EOM's intact       Lungs:     Clear to auscultation bilaterally, respirations unlabored  Heart:    Regular rate and rhythm  Neurologic:   Awake, alert, oriented x 3. No apparent focal neurological           defect.           Assessment & Plan:     1. Arteriosclerosis of coronary artery Asymptomatic. Compliant with medication.  Continue aggressive risk factor modification.  Continue routine follow up Dr. Gwen PoundsKowalski.  - Comprehensive metabolic panel  2. Benign  hypertension Well controlled.  Continue current medications.    3. Chronic obstructive pulmonary disease, unspecified COPD type (HCC) Using occasional rescue inhaler. On no maintenance inhalers. He received letter from [employer] employee health plan that Ventolin is no longer covered. He is going to check with pharmacy to see what out of pocket cost is before changing to a different inhaler.   4. Hypercholesteremia  - Comprehensive metabolic panel - Lipid panel - T4 AND TSH  7. Osteoporosis, unspecified osteoporosis type, unspecified pathological fracture presence He has history if extensive steroid use, no on chronic daily prednisone for the last year due to unexplained weight loss which prednisone seems to have helped with. He has been extensively counseled on the long term adverse effects of chronic steroid use and prefers to continue as he feels benefit outweigh risk. Will check vitamin D. Will need follow up BMD soon. Consider alternative appetite stimulants if BMD is worsening. Continue Boniva.   - VITAMIN D 25 Hydroxy (Vit-D Deficiency, Fractures)       Mila Merryonald Zaylia Riolo, MD  [facility]

## 2016-03-27 NOTE — Progress Notes (Signed)
Subjective:   Roger Gutierrez is a 76 y.o. male who presents for Medicare Annual/Subsequent preventive examination.  Review of Systems:  N/A  Cardiac Risk Factors include: advanced age (>89men, >44 women);dyslipidemia;hypertension;male gender;smoking/ tobacco exposure     Objective:    Vitals: BP 99/74 (BP Location: Right Arm)   Pulse 72   Temp 97.7 F (36.5 C) (Oral)   Ht 5\' 5"  (1.651 m)   Wt 111 lb (50.3 kg)   BMI 18.47 kg/m   Body mass index is 18.47 kg/m.  Tobacco History  Smoking Status  . Current Some Day Smoker  . Types: Cigars  . Last attempt to quit: 03/25/2003  Smokeless Tobacco  . Never Used     Ready to quit: Not Answered Counseling given: Not Answered   Past Medical History:  Diagnosis Date  . Allergy   . Cataract    removed  . COPD (chronic obstructive pulmonary disease) (HCC)   . Hyperlipidemia   . Hypertension   . Myocardial infarction   . Osteoporosis    Past Surgical History:  Procedure Laterality Date  . ANGIOPLASTY     cardiac; 3 stents  . ankel fusion Left 1958  . EYE SURGERY Bilateral 08/2011 and 09/2011   cataract extraction  . TONSILLECTOMY AND ADENOIDECTOMY  1950's   Family History  Problem Relation Age of Onset  . Hypertension Mother   . Healthy Sister    History  Sexual Activity  . Sexual activity: Not on file    Comment: 1 cigar daily    Outpatient Encounter Prescriptions as of 03/27/2016  Medication Sig  . albuterol (PROVENTIL HFA;VENTOLIN HFA) 108 (90 BASE) MCG/ACT inhaler Inhale 2 puffs into the lungs every 6 (six) hours as needed for wheezing or shortness of breath.  Marland Kitchen aspirin 81 MG tablet Take 1 tablet by mouth daily.  Marland Kitchen ibandronate (BONIVA) 150 MG tablet Take 1 tablet (150 mg total) by mouth every 30 (thirty) days.  Marland Kitchen ibuprofen (ADVIL,MOTRIN) 200 MG tablet Take 400 mg by mouth as needed.  . pravastatin (PRAVACHOL) 80 MG tablet Take 80 mg by mouth daily.  . predniSONE (DELTASONE) 10 MG tablet Take 1 tablet (10 mg  total) by mouth daily with breakfast.  . ramipril (ALTACE) 5 MG capsule Take 1 capsule by mouth daily.  . mirtazapine (REMERON) 15 MG tablet Take 1 tablet (15 mg total) by mouth at bedtime. 1/2 a night for 7 day and then 1 at night. (Patient not taking: Reported on 03/27/2016)  . pravastatin (PRAVACHOL) 40 MG tablet Take 1 tablet by mouth daily.   No facility-administered encounter medications on file as of 03/27/2016.     Activities of Daily Living In your present state of health, do you have any difficulty performing the following activities: 03/27/2016 07/19/2015  Hearing? N N  Vision? N N  Difficulty concentrating or making decisions? N N  Walking or climbing stairs? N N  Dressing or bathing? N N  Doing errands, shopping? N N  Preparing Food and eating ? N -  Using the Toilet? N -  In the past six months, have you accidently leaked urine? N -  Do you have problems with loss of bowel control? N -  Managing your Medications? N -  Managing your Finances? N -  Housekeeping or managing your Housekeeping? N -  Some recent data might be hidden    Patient Care Team: Malva Limes, MD as PCP - General (Family Medicine) Lamar Blinks, MD as  Consulting Physician (Cardiology)   Assessment:     Exercise Activities and Dietary recommendations Type of exercise: walking, Time (Minutes): 25, Frequency (Times/Week): 2 (-3), Weekly Exercise (Minutes/Week): 50, Intensity: Mild  Goals    . Increase water intake          Starting 03/27/16, I will increase my water intake to 4 glasses a day.      Fall Risk Fall Risk  03/27/2016 07/19/2015  Falls in the past year? No No   Depression Screen PHQ 2/9 Scores 03/27/2016 07/19/2015  PHQ - 2 Score 0 0    Cognitive Function     6CIT Screen 03/27/2016  What Year? 0 points  What month? 0 points  What time? 0 points  Count back from 20 0 points  Months in reverse 0 points  Repeat phrase 6 points  Total Score 6    Immunization History    Administered Date(s) Administered  . Influenza, High Dose Seasonal PF 01/13/2015  . Pneumococcal Conjugate-13 04/29/2014  . Zoster 03/03/2012   Screening Tests Health Maintenance  Topic Date Due  . INFLUENZA VACCINE  11/23/2016 (Originally 10/24/2015)  . TETANUS/TDAP  03/25/2017 (Originally 05/10/1961)  . PNA vac Low Risk Adult (2 of 2 - PPSV23) 03/25/2017 (Originally 04/30/2015)  . COLONOSCOPY  02/01/2018  . ZOSTAVAX  Completed      Plan:  I have personally reviewed and addressed the Medicare Annual Wellness questionnaire and have noted the following in the patient's chart:  A. Medical and social history B. Use of alcohol, tobacco or illicit drugs  C. Current medications and supplements D. Functional ability and status E.  Nutritional status F.  Physical activity G. Advance directives H. List of other physicians I.  Hospitalizations, surgeries, and ER visits in previous 12 months J.  Vitals K. Screenings such as hearing and vision if needed, cognitive and depression L. Referrals and appointments - none  In addition, I have reviewed and discussed with patient certain preventive protocols, quality metrics, and best practice recommendations. A written personalized care plan for preventive services as well as general preventive health recommendations were provided to patient.  See attached scanned questionnaire for additional information.   Signed,  [name], LPN Nurse Health Advisor   MD Recommendations: None. Pt declined flu, pneumovax 23 and tdap vaccines today.  I have reviewed the health advisor's note, was available for consultation, and agree with documentation and plan  [name], MD

## 2016-03-27 NOTE — Patient Instructions (Signed)
[name] , Thank you for taking time to come for your Medicare Wellness Visit. I appreciate your ongoing commitment to your health goals. Please review the following plan we discussed and let me know if I can assist you in the future.   These are the goals we discussed: Goals    . Increase water intake          Starting 03/27/16, I will increase my water intake to 4 glasses a day.       This is a list of the screening recommended for you and due dates:  Health Maintenance  Topic Date Due  . Tetanus Vaccine  05/10/1961  . Colon Cancer Screening  05/10/1992  . Pneumonia vaccines (2 of 2 - PPSV23) 04/30/2015  . Flu Shot  10/24/2015  . Shingles Vaccine  Completed   Preventive Care for Adults  A healthy lifestyle and preventive care can promote health and wellness. Preventive health guidelines for adults include the following key practices.  . A routine yearly physical is a good way to check with your health care provider about your health and preventive screening. It is a chance to share any concerns and updates on your health and to receive a thorough exam.  . Visit your dentist for a routine exam and preventive care every 6 months. Brush your teeth twice a day and floss once a day. Good oral hygiene prevents tooth decay and gum disease.  . The frequency of eye exams is based on your age, health, family medical history, use  of contact lenses, and other factors. Follow your health care provider's ecommendations for frequency of eye exams.  . Eat a healthy diet. Foods like vegetables, fruits, whole grains, low-fat dairy products, and lean protein foods contain the nutrients you need without too many calories. Decrease your intake of foods high in solid fats, added sugars, and salt. Eat the right amount of calories for you. Get information about a proper diet from your health care provider, if necessary.  . Regular physical exercise is one of the most important things you can do for  your health. Most adults should get at least 150 minutes of moderate-intensity exercise (any activity that increases your heart rate and causes you to sweat) each week. In addition, most adults need muscle-strengthening exercises on 2 or more days a week.  Silver Sneakers may be a benefit available to you. To determine eligibility, you may visit the website: www.silversneakers.com or contact program at [phone] Mon-Fri between 8AM-8PM.   . Maintain a healthy weight. The body mass index (BMI) is a screening tool to identify possible weight problems. It provides an estimate of body fat based on height and weight. Your health care provider can find your BMI and can help you achieve or maintain a healthy weight.   For adults 20 years and older: ? A BMI below 18.5 is considered underweight. ? A BMI of 18.5 to 24.9 is normal. ? A BMI of 25 to 29.9 is considered overweight. ? A BMI of 30 and above is considered obese.   . Maintain normal blood lipids and cholesterol levels by exercising and minimizing your intake of saturated fat. Eat a balanced diet with plenty of fruit and vegetables. Blood tests for lipids and cholesterol should begin at age 74 and be repeated every 5 years. If your lipid or cholesterol levels are high, you are over 50, or you are at high risk for heart disease, you may need your cholesterol levels checked more  frequently. Ongoing high lipid and cholesterol levels should be treated with medicines if diet and exercise are not working.  . If you smoke, find out from your health care provider how to quit. If you do not use tobacco, please do not start.  . If you choose to drink alcohol, please do not consume more than 2 drinks per day. One drink is considered to be 12 ounces (355 mL) of beer, 5 ounces (148 mL) of wine, or 1.5 ounces (44 mL) of liquor.  . If you are 24-76 years old, ask your health care provider if you should take aspirin to prevent strokes.  . Use sunscreen.  Apply sunscreen liberally and repeatedly throughout the day. You should seek shade when your shadow is shorter than you. Protect yourself by wearing long sleeves, pants, a wide-brimmed hat, and sunglasses year round, whenever you are outdoors.  . Once a month, do a whole body skin exam, using a mirror to look at the skin on your back. Tell your health care provider of new moles, moles that have irregular borders, moles that are larger than a pencil eraser, or moles that have changed in shape or color.

## 2016-03-29 ENCOUNTER — Telehealth: Payer: Self-pay

## 2016-03-29 ENCOUNTER — Encounter: Payer: Self-pay | Admitting: Family Medicine

## 2016-03-29 DIAGNOSIS — E559 Vitamin D deficiency, unspecified: Secondary | ICD-10-CM | POA: Insufficient documentation

## 2016-03-29 DIAGNOSIS — J449 Chronic obstructive pulmonary disease, unspecified: Secondary | ICD-10-CM

## 2016-03-29 LAB — COMPREHENSIVE METABOLIC PANEL
A/G RATIO: 1.8 (ref 1.2–2.2)
ALBUMIN: 3.8 g/dL (ref 3.5–4.8)
ALT: 15 IU/L (ref 0–44)
AST: 18 IU/L (ref 0–40)
Alkaline Phosphatase: 73 IU/L (ref 39–117)
BILIRUBIN TOTAL: 0.6 mg/dL (ref 0.0–1.2)
BUN / CREAT RATIO: 11 (ref 10–24)
BUN: 10 mg/dL (ref 8–27)
CALCIUM: 9.1 mg/dL (ref 8.6–10.2)
CHLORIDE: 100 mmol/L (ref 96–106)
CO2: 24 mmol/L (ref 18–29)
Creatinine, Ser: 0.9 mg/dL (ref 0.76–1.27)
GFR, EST AFRICAN AMERICAN: 98 mL/min/{1.73_m2} (ref >59)
GFR, EST NON AFRICAN AMERICAN: 84 mL/min/{1.73_m2} (ref >59)
Globulin, Total: 2.1 g/dL (ref 1.5–4.5)
Glucose: 82 mg/dL (ref 65–99)
POTASSIUM: 4.5 mmol/L (ref 3.5–5.2)
Sodium: 139 mmol/L (ref 134–144)
TOTAL PROTEIN: 5.9 g/dL — AB (ref 6.0–8.5)

## 2016-03-29 LAB — LIPID PANEL
CHOL/HDL RATIO: 3.2 ratio (ref 0.0–5.0)
Cholesterol, Total: 187 mg/dL (ref 100–199)
HDL: 58 mg/dL (ref >39)
LDL Calculated: 107 mg/dL — ABNORMAL HIGH (ref 0–99)
Triglycerides: 111 mg/dL (ref 0–149)
VLDL CHOLESTEROL CAL: 22 mg/dL (ref 5–40)

## 2016-03-29 LAB — T4 AND TSH
T4, Total: 8 ug/dL (ref 4.5–12.0)
TSH: 2.03 u[IU]/mL (ref 0.450–4.500)

## 2016-03-29 LAB — VITAMIN D 25 HYDROXY (VIT D DEFICIENCY, FRACTURES): VIT D 25 HYDROXY: 12.3 ng/mL — AB (ref 30.0–100.0)

## 2016-03-29 MED ORDER — VITAMIN D (ERGOCALCIFEROL) 1.25 MG (50000 UNIT) PO CAPS: 50000.0000 [IU] | ORAL_CAPSULE | ORAL | 4 refills | Status: DC

## 2016-03-29 NOTE — Telephone Encounter (Signed)
-----   Message from Malva Limesonald E Fisher, MD sent at 03/29/2016  8:14 AM EST ----- Vitamin d level is very low which makes osteroporosis worse. Need to start vitamin d 50,000 units once a week, #12 rf x 4. Follow up o.v. And labs 10-12 weeks.

## 2016-03-29 NOTE — Telephone Encounter (Signed)
Pt advised of lab results. FU scheduled and vitamin D sent to pharmacy. Pt requesting refill on prednisone. Allene DillonEmily Drozdowski, CMA

## 2016-03-30 ENCOUNTER — Other Ambulatory Visit: Payer: Self-pay | Admitting: Family Medicine

## 2016-03-30 DIAGNOSIS — J449 Chronic obstructive pulmonary disease, unspecified: Secondary | ICD-10-CM

## 2016-04-01 NOTE — Telephone Encounter (Signed)
Pt advised and agrees with treatment plan. Toba Claudio Drozdowski, CMA  

## 2016-04-01 NOTE — Telephone Encounter (Signed)
Have sent refill to pharmacy. But he needs to work on reducing dose of prednisone since it makes osteoporosis even worse. Suggest he take it every other day until his next follow up appointment.

## 2016-05-29 ENCOUNTER — Ambulatory Visit (INDEPENDENT_AMBULATORY_CARE_PROVIDER_SITE_OTHER): Payer: Medicare Other | Admitting: Family Medicine

## 2016-05-29 ENCOUNTER — Encounter: Payer: Self-pay | Admitting: Family Medicine

## 2016-05-29 VITALS — BP 112/60 | HR 72 | Temp 98.5°F | Resp 16 | Wt 111.0 lb

## 2016-05-29 DIAGNOSIS — E559 Vitamin D deficiency, unspecified: Secondary | ICD-10-CM

## 2016-05-29 DIAGNOSIS — Z7952 Long term (current) use of systemic steroids: Secondary | ICD-10-CM | POA: Diagnosis not present

## 2016-05-29 DIAGNOSIS — M810 Age-related osteoporosis without current pathological fracture: Secondary | ICD-10-CM

## 2016-05-29 NOTE — Progress Notes (Signed)
Patient: Roger Gutierrez Male    DOB: 05-May-1940   76 y.o.   MRN: [id number] Visit Date: 05/29/2016  Today's Provider: Mila Merryonald Candance Bohlman, MD   Chief Complaint  Patient presents with  . Osteoporosis  . Vitamin D deficency   Subjective:    HPI  Osteoporosis, follow up: vitamin D deficiency follow up Patient was last seen in the office about 12 weeks ago. His vitamin D was very low (12.8), and he was advised to start Vitamin D 50,000u once weekly. Patient is also currently taking Boniva 150mg  once monthly. His last BMD was 04/05/2014.  Lab Results  Component Value Date   VD25OH 12.3 (L) 03/28/2016   He has also stopped taking prednisone since last visit. He states his appetite has lessened, and he has less energy but his weight has been stable. Otherwise feels well.    Wt Readings from Last 3 Encounters:  05/29/16 111 lb (50.3 kg)  03/27/16 111 lb (50.3 kg)  07/19/15 112 lb (50.8 kg)    Allergies  Allergen Reactions  . Megace  [Megestrol] Shortness Of Breath and Palpitations   V  Current Outpatient Prescriptions:  .  albuterol (PROVENTIL HFA;VENTOLIN HFA) 108 (90 Base) MCG/ACT inhaler, Inhale 2 puffs into the lungs every 6 (six) hours as needed for wheezing or shortness of breath., Disp: 1 Inhaler, Rfl: 2 .  aspirin 81 MG tablet, Take 1 tablet by mouth daily., Disp: , Rfl:  .  ibandronate (BONIVA) 150 MG tablet, Take 1 tablet (150 mg total) by mouth every 30 (thirty) days., Disp: 3 tablet, Rfl: 3 .  ibuprofen (ADVIL,MOTRIN) 200 MG tablet, Take 400 mg by mouth as needed., Disp: , Rfl:  .  pravastatin (PRAVACHOL) 80 MG tablet, Take 80 mg by mouth daily., Disp: , Rfl:  .  ramipril (ALTACE) 5 MG capsule, Take 1 capsule by mouth daily., Disp: , Rfl:  .  Vitamin D, Ergocalciferol, (DRISDOL) 50000 units CAPS capsule, Take 1 capsule (50,000 Units total) by mouth every 7 (seven) days., Disp: 12 capsule, Rfl: 4 .  pravastatin (PRAVACHOL) 40 MG tablet, Take 1 tablet by mouth daily.,  Disp: , Rfl:   Review of Systems  Constitutional: Negative.   Respiratory: Negative.   Cardiovascular: Negative.   Musculoskeletal: Positive for arthralgias.  Neurological: Negative.     Social History  Substance Use Topics  . Smoking status: Current Some Day Smoker    Types: Cigars    Last attempt to quit: 03/25/2003  . Smokeless tobacco: Never Used  . Alcohol use Yes     Comment: occasionally beer   Objective:   BP 112/60 (BP Location: Right Arm, Patient Position: Sitting, Cuff Size: Normal)   Pulse 72   Temp 98.5 F (36.9 C)   Resp 16   Wt 111 lb (50.3 kg)   BMI 18.47 kg/m  Vitals:   05/29/16 1501  BP: 112/60  Pulse: 72  Resp: 16  Temp: 98.5 F (36.9 C)  Weight: 111 lb (50.3 kg)     Physical Exam   General Appearance:    Alert, cooperative, no distress  Eyes:    PERRL, conjunctiva/corneas clear, EOM's intact       Lungs:     Clear to auscultation bilaterally, respirations unlabored  Heart:    Regular rate and rhythm  Neurologic:   Awake, alert, oriented x 3. No apparent focal neurological           defect.  Assessment & Plan:     1. Vitamin D deficiency Doing well with initiation of vitamin D supplements.  - VITAMIN D 25 Hydroxy (Vit-D Deficiency, Fractures)  2. Osteoporosis, unspecified osteoporosis type, unspecified pathological fracture presence Continue bisphonates therapy which was started in 2014.   3. Long term current use of systemic steroids Is now off of prednisone and weight remains stable.        [name], MD  [facility]

## 2016-05-30 ENCOUNTER — Telehealth: Payer: Self-pay

## 2016-05-30 ENCOUNTER — Other Ambulatory Visit: Payer: Self-pay | Admitting: Family Medicine

## 2016-05-30 DIAGNOSIS — M810 Age-related osteoporosis without current pathological fracture: Secondary | ICD-10-CM

## 2016-05-30 LAB — VITAMIN D 25 HYDROXY (VIT D DEFICIENCY, FRACTURES): Vit D, 25-Hydroxy: 54.8 ng/mL (ref 30.0–100.0)

## 2016-05-30 MED ORDER — IBANDRONATE SODIUM 150 MG PO TABS: 150.0000 mg | ORAL_TABLET | ORAL | 3 refills | Status: DC

## 2016-05-30 NOTE — Telephone Encounter (Signed)
Pt advised.   Thanks,   -Roger Gutierrez  

## 2016-05-30 NOTE — Telephone Encounter (Signed)
-----   Message from Malva Limesonald E Fisher, MD sent at 05/30/2016  8:05 AM EST ----- Vitamin d level is back up to normal. Continue once a week vitamin d.

## 2016-09-24 ENCOUNTER — Other Ambulatory Visit: Payer: Self-pay | Admitting: Family Medicine

## 2016-09-24 DIAGNOSIS — M810 Age-related osteoporosis without current pathological fracture: Secondary | ICD-10-CM

## 2016-09-24 MED ORDER — PREDNISONE 10 MG PO TABS: 10.0000 mg | ORAL_TABLET | Freq: Every day | ORAL | 0 refills | Status: DC

## 2016-09-24 NOTE — Telephone Encounter (Signed)
Patient states that he would like a refill on prednisone 10mg , 90 day supply.  He states that he has been off of this medication for about five months because you wanted to try not taking it anymore but its not working for him.  [facility] [address]

## 2016-09-24 NOTE — Telephone Encounter (Signed)
Patient here with his wife who is getting CPE today.Wt is 106.8 pounds today, send in 30day prescription for prednisone and is to schedule follow up in 4 weeks.

## 2016-09-24 NOTE — Telephone Encounter (Signed)
Need office visit for weight check.

## 2016-10-29 ENCOUNTER — Ambulatory Visit (INDEPENDENT_AMBULATORY_CARE_PROVIDER_SITE_OTHER): Payer: Medicare Other | Admitting: Family Medicine

## 2016-10-29 ENCOUNTER — Encounter: Payer: Self-pay | Admitting: Family Medicine

## 2016-10-29 VITALS — BP 120/84 | HR 107 | Temp 98.2°F | Resp 16 | Ht 65.0 in | Wt 110.2 lb

## 2016-10-29 DIAGNOSIS — R634 Abnormal weight loss: Secondary | ICD-10-CM

## 2016-10-29 DIAGNOSIS — G4700 Insomnia, unspecified: Secondary | ICD-10-CM | POA: Diagnosis not present

## 2016-10-29 DIAGNOSIS — F329 Major depressive disorder, single episode, unspecified: Secondary | ICD-10-CM | POA: Insufficient documentation

## 2016-10-29 DIAGNOSIS — F3289 Other specified depressive episodes: Secondary | ICD-10-CM | POA: Diagnosis not present

## 2016-10-29 DIAGNOSIS — F32A Depression, unspecified: Secondary | ICD-10-CM | POA: Insufficient documentation

## 2016-10-29 MED ORDER — MIRTAZAPINE 7.5 MG PO TABS: 7.5000 mg | ORAL_TABLET | Freq: Every day | ORAL | 0 refills | Status: DC

## 2016-10-29 MED ORDER — PREDNISONE 10 MG PO TABS: 10.0000 mg | ORAL_TABLET | Freq: Every day | ORAL | 0 refills | Status: DC

## 2016-10-29 NOTE — Progress Notes (Signed)
Patient: Roger Gutierrez Male    DOB: 1940/04/07   76 y.o.   MRN: [id number] Visit Date: 10/29/2016  Today's Provider: [name], MD   Chief Complaint  Patient presents with  . Follow-up   Subjective:    HPI  Long term current use of systemic steroids From 09/24/2016-patient was given 30 day supply of prednisone after being off medication for about 5 months. Patient was advised to follow up in 4 weeks for office visit.   Patient states that 30 days round of prednisone did not help much this tim. Patient is still feeling fatigued and not sleeping well at night. Patient also stated that he is feeling depressed lately and stressed due to caring for his wife.    Allergies  Allergen Reactions  . Megace  [Megestrol] Shortness Of Breath and Palpitations   Wt Readings from Last 3 Encounters:  10/29/16 110 lb 3.2 oz (50 kg)  09/24/16 106 lb 12.8 oz (48.4 kg)  05/29/16 111 lb (50.3 kg)      Current Outpatient Prescriptions:  .  albuterol (PROVENTIL HFA;VENTOLIN HFA) 108 (90 Base) MCG/ACT inhaler, Inhale 2 puffs into the lungs every 6 (six) hours as needed for wheezing or shortness of breath., Disp: 1 Inhaler, Rfl: 2 .  aspirin 81 MG tablet, Take 1 tablet by mouth daily., Disp: , Rfl:  .  ibandronate (BONIVA) 150 MG tablet, Take 1 tablet (150 mg total) by mouth every 30 (thirty) days., Disp: 3 tablet, Rfl: 3 .  ibuprofen (ADVIL,MOTRIN) 200 MG tablet, Take 400 mg by mouth as needed., Disp: , Rfl:  .  pravastatin (PRAVACHOL) 80 MG tablet, Take 80 mg by mouth daily., Disp: , Rfl:  .  ramipril (ALTACE) 5 MG capsule, Take 1 capsule by mouth daily., Disp: , Rfl:  .  Vitamin D, Ergocalciferol, (DRISDOL) 50000 units CAPS capsule, Take 1 capsule (50,000 Units total) by mouth every 7 (seven) days., Disp: 12 capsule, Rfl: 4  Review of Systems  Constitutional: Positive for fatigue. Negative for appetite change, chills and fever.  Respiratory: Negative for chest tightness, shortness of  breath and wheezing.   Cardiovascular: Negative for chest pain and palpitations.  Gastrointestinal: Negative for abdominal pain, nausea and vomiting.  Psychiatric/Behavioral: Positive for sleep disturbance.    Social History  Substance Use Topics  . Smoking status: Current Some Day Smoker    Types: Cigars    Last attempt to quit: 03/25/2003  . Smokeless tobacco: Never Used  . Alcohol use Yes     Comment: occasionally beer   Objective:   BP 120/84 (BP Location: Right Arm, Patient Position: Sitting, Cuff Size: Normal)   Pulse (!) 107   Temp 98.2 F (36.8 C) (Oral)   Resp 16   Ht 5\' 5"  (1.651 m)   Wt 110 lb 3.2 oz (50 kg)   SpO2 97%   BMI 18.34 kg/m  Vitals:   10/29/16 1003  BP: 120/84  Pulse: (!) 107  Resp: 16  Temp: 98.2 F (36.8 C)  TempSrc: Oral  SpO2: 97%  Weight: 110 lb 3.2 oz (50 kg)  Height: 5\' 5"  (1.651 m)     Physical Exam   General Appearance:    Alert, cooperative, no distress  Eyes:    PERRL, conjunctiva/corneas clear, EOM's intact       Lungs:     Clear to auscultation bilaterally, respirations unlabored  Heart:    Regular rate and rhythm  Neurologic:   Awake, alert,  oriented x 3. No apparent focal neurological           defect.           Assessment & Plan:    . 1. Abnormal loss of weight Poor appetite multifactorial, likely exacerbated by depression and poor sleep. Try mirtazapine. Given 30 more days prednisone, but advised to stop as soon as he feels his appetite improve after starting mirtazapine.  - mirtazapine (REMERON) 7.5 MG tablet; Take 1 tablet (7.5 mg total) by mouth at bedtime. For 7 days, then increase to 2 tablets daily  Dispense: 60 tablet; Refill: 0 - predniSONE (DELTASONE) 10 MG tablet; Take 1 tablet (10 mg total) by mouth daily with breakfast.  Dispense: 30 tablet; Refill: 0  2. Other depression Start - mirtazapine (REMERON) 7.5 MG tablet; Take 1 tablet (7.5 mg total) by mouth at bedtime. For 7 days, then increase to 2 tablets  daily  Dispense: 60 tablet; Refill: 0  3. Insomnia, unspecified type  - mirtazapine (REMERON) 7.5 MG tablet; Take 1 tablet (7.5 mg total) by mouth at bedtime. For 7 days, then increase to 2 tablets daily  Dispense: 60 tablet; Refill: 0  Return in about 4 weeks (around 11/26/2016).       Mila Merryonald Mykalah Saari, MD  [facility]

## 2016-11-26 ENCOUNTER — Ambulatory Visit (INDEPENDENT_AMBULATORY_CARE_PROVIDER_SITE_OTHER): Payer: Medicare Other | Admitting: Family Medicine

## 2016-11-26 ENCOUNTER — Encounter: Payer: Self-pay | Admitting: Family Medicine

## 2016-11-26 VITALS — BP 110/72 | HR 96 | Temp 98.5°F | Resp 16 | Ht 65.0 in | Wt 111.0 lb

## 2016-11-26 DIAGNOSIS — G4700 Insomnia, unspecified: Secondary | ICD-10-CM

## 2016-11-26 DIAGNOSIS — F3289 Other specified depressive episodes: Secondary | ICD-10-CM | POA: Diagnosis not present

## 2016-11-26 DIAGNOSIS — R634 Abnormal weight loss: Secondary | ICD-10-CM

## 2016-11-26 MED ORDER — MIRTAZAPINE 7.5 MG PO TABS: 7.5000 mg | ORAL_TABLET | Freq: Every day | ORAL | 2 refills | Status: DC

## 2016-11-26 NOTE — Progress Notes (Signed)
Patient: Roger Gutierrez Male    DOB: 07/01/1940   76 y.o.   MRN: [id number] Visit Date: 11/26/2016  Today's Provider: [name], MD   Chief Complaint  Patient presents with  . Depression  . Insomnia   Subjective:    HPI Depression, follow up: Patient comes in today for a follow up. He was last seen in the office 1 month ago. He was started on mirtazapine 7.5mg  to help with his symptoms. He feels the medication has helped with depression. He reports that the 15mg  was too strong, making too sleepy at night and sleeping to long in the morning.  Has cut back to original dose of 7.5, sleeping 9-10 hours a night, and feels well rested during the day.Has also noted improvement in appetites and gained 1 pound since last visit. Had gained 4 pounds on prednisone in July.      Wt Readings from Last 3 Encounters:  11/26/16 111 lb (50.3 kg)  10/29/16 110 lb 3.2 oz (50 kg)  09/24/16 106 lb 12.8 oz (48.4 kg)        Allergies  Allergen Reactions  . Megace  [Megestrol] Shortness Of Breath and Palpitations     Current Outpatient Prescriptions:  .  albuterol (PROVENTIL HFA;VENTOLIN HFA) 108 (90 Base) MCG/ACT inhaler, Inhale 2 puffs into the lungs every 6 (six) hours as needed for wheezing or shortness of breath., Disp: 1 Inhaler, Rfl: 2 .  aspirin 81 MG tablet, Take 1 tablet by mouth daily., Disp: , Rfl:  .  ibandronate (BONIVA) 150 MG tablet, Take 1 tablet (150 mg total) by mouth every 30 (thirty) days., Disp: 3 tablet, Rfl: 3 .  ibuprofen (ADVIL,MOTRIN) 200 MG tablet, Take 400 mg by mouth as needed., Disp: , Rfl:  .  mirtazapine (REMERON) 7.5 MG tablet, Take 1 tablet (7.5 mg total) by mouth at bedtime. For 7 days, then increase to 2 tablets daily, Disp: 60 tablet, Rfl: 0 .  pravastatin (PRAVACHOL) 80 MG tablet, Take 80 mg by mouth daily., Disp: , Rfl:  .  ramipril (ALTACE) 5 MG capsule, Take 1 capsule by mouth daily., Disp: , Rfl:  .  Vitamin D, Ergocalciferol, (DRISDOL) 50000  units CAPS capsule, Take 1 capsule (50,000 Units total) by mouth every 7 (seven) days., Disp: 12 capsule, Rfl: 4 .  predniSONE (DELTASONE) 10 MG tablet, Take 1 tablet (10 mg total) by mouth daily with breakfast. (Patient not taking: Reported on 11/26/2016), Disp: 30 tablet, Rfl: 0  Review of Systems  Constitutional: Positive for activity change and fatigue.  HENT: Positive for ear pain.   Cardiovascular: Negative.   Musculoskeletal: Negative.   Neurological: Negative.   Psychiatric/Behavioral: Positive for sleep disturbance.    Social History  Substance Use Topics  . Smoking status: Current Some Day Smoker    Types: Cigars    Last attempt to quit: 03/25/2003  . Smokeless tobacco: Never Used  . Alcohol use Yes     Comment: occasionally beer   Objective:   BP 110/72 (BP Location: Left Arm, Patient Position: Sitting, Cuff Size: Normal)   Pulse 96   Temp 98.5 F (36.9 C)   Resp 16   Ht 5\' 5"  (1.651 m)   Wt 111 lb (50.3 kg)   SpO2 99%   BMI 18.47 kg/m  Vitals:   11/26/16 0959  BP: 110/72  Pulse: 96  Resp: 16  Temp: 98.5 F (36.9 C)  SpO2: 99%  Weight: 111 lb (50.3 kg)  Height: 5\' 5"  (1.651 m)     Physical Exam   General Appearance:    Alert, cooperative, no distress  Eyes:    PERRL, conjunctiva/corneas clear, EOM's intact       Lungs:     Clear to auscultation bilaterally, respirations unlabored  Heart:    Regular rate and rhythm  Neurologic:   Awake, alert, oriented x 3. No apparent focal neurological           defect.           Assessment & Plan:     1. Abnormal loss of weight Improved appetite and maintaining weight since stopping prednisone and starting.  - mirtazapine (REMERON) 7.5 MG tablet; Take 1 tablet (7.5 mg total) by mouth at bedtime.  Dispense: 60 tablet; Refill: 2  2. Other depression Improved since starting  - mirtazapine (REMERON) 7.5 MG tablet; Take 1 tablet (7.5 mg total) by mouth at bedtime.  Dispense: 60 tablet; Refill: 2  3. Insomnia,  unspecified type Was too sleepy on 15mg  mirtazapine, but better on 7.5mg  - mirtazapine (REMERON) 7.5 MG tablet; Take 1 tablet (7.5 mg total) by mouth at bedtime.  Dispense: 60 tablet; Refill: 2  He refused flu vaccine but plans to get at a later date. He wants to call back to schedule AWV in January.       Mila Merryonald Shelia Kingsberry, MD  [facility]

## 2016-12-16 DIAGNOSIS — I6523 Occlusion and stenosis of bilateral carotid arteries: Secondary | ICD-10-CM | POA: Insufficient documentation

## 2016-12-16 DIAGNOSIS — I2119 ST elevation (STEMI) myocardial infarction involving other coronary artery of inferior wall: Secondary | ICD-10-CM | POA: Insufficient documentation

## 2017-04-17 ENCOUNTER — Ambulatory Visit (INDEPENDENT_AMBULATORY_CARE_PROVIDER_SITE_OTHER): Payer: Medicare Other

## 2017-04-17 VITALS — BP 94/68 | HR 80 | Temp 97.6°F | Ht 65.0 in | Wt 107.4 lb

## 2017-04-17 DIAGNOSIS — Z0000 Encounter for general adult medical examination without abnormal findings: Secondary | ICD-10-CM

## 2017-04-17 NOTE — Patient Instructions (Signed)
[name] , Thank you for taking time to come for your Medicare Wellness Visit. I appreciate your ongoing commitment to your health goals. Please review the following plan we discussed and let me know if I can assist you in the future.   Screening recommendations/referrals: Colonoscopy: Up to date Recommended yearly ophthalmology/optometry visit for glaucoma screening and checkup Recommended yearly dental visit for hygiene and checkup  Vaccinations: Influenza vaccine: Up to date Pneumococcal vaccine: Up to date Tdap vaccine: Pt declines today.  Shingles vaccine: Pt declines today.     Advanced directives: Please bring a copy of your POA (Power of Attorney) and/or Living Will to your next appointment.   Conditions/risks identified: Smoking cessation; Recommend increasing water intake to 4 glasses a day. Try substituting a cup of coffee for a glass of water.   Next appointment: 04/23/17@ 2:00 PM  Preventive Care 65 Years and Older, Male Preventive care refers to lifestyle choices and visits with your health care provider that can promote health and wellness. What does preventive care include?  A yearly physical exam. This is also called an annual well check.  Dental exams once or twice a year.  Routine eye exams. Ask your health care provider how often you should have your eyes checked.  Personal lifestyle choices, including:  Daily care of your teeth and gums.  Regular physical activity.  Eating a healthy diet.  Avoiding tobacco and drug use.  Limiting alcohol use.  Practicing safe sex.  Taking low doses of aspirin every day.  Taking vitamin and mineral supplements as recommended by your health care provider. What happens during an annual well check? The services and screenings done by your health care provider during your annual well check will depend on your age, overall health, lifestyle risk factors, and family history of disease. Counseling  Your health care  provider may ask you questions about your:  Alcohol use.  Tobacco use.  Drug use.  Emotional well-being.  Home and relationship well-being.  Sexual activity.  Eating habits.  History of falls.  Memory and ability to understand (cognition).  Work and work Astronomerenvironment. Screening  You may have the following tests or measurements:  Height, weight, and BMI.  Blood pressure.  Lipid and cholesterol levels. These may be checked every 5 years, or more frequently if you are over 75 years old.  Skin check.  Lung cancer screening. You may have this screening every year starting at age 75 if you have a 30-pack-year history of smoking and currently smoke or have quit within the past 15 years.  Fecal occult blood test (FOBT) of the stool. You may have this test every year starting at age 75.  Flexible sigmoidoscopy or colonoscopy. You may have a sigmoidoscopy every 5 years or a colonoscopy every 10 years starting at age 75.  Prostate cancer screening. Recommendations will vary depending on your family history and other risks.  Hepatitis C blood test.  Hepatitis B blood test.  Sexually transmitted disease (STD) testing.  Diabetes screening. This is done by checking your blood sugar (glucose) after you have not eaten for a while (fasting). You may have this done every 1-3 years.  Abdominal aortic aneurysm (AAA) screening. You may need this if you are a current or former smoker.  Osteoporosis. You may be screened starting at age 75 if you are at high risk. Talk with your health care provider about your test results, treatment options, and if necessary, the need for more tests. Vaccines  Your  health care provider may recommend certain vaccines, such as:  Influenza vaccine. This is recommended every year.  Tetanus, diphtheria, and acellular pertussis (Tdap, Td) vaccine. You may need a Td booster every 10 years.  Zoster vaccine. You may need this after age 53.  Pneumococcal  13-valent conjugate (PCV13) vaccine. One dose is recommended after age 19.  Pneumococcal polysaccharide (PPSV23) vaccine. One dose is recommended after age 75. Talk to your health care provider about which screenings and vaccines you need and how often you need them. This information is not intended to replace advice given to you by your health care provider. Make sure you discuss any questions you have with your health care provider. Document Released: 04/07/2015 Document Revised: 11/29/2015 Document Reviewed: 01/10/2015 Elsevier Interactive Patient Education  2017 [place] Prevention in the Home Falls can cause injuries. They can happen to people of all ages. There are many things you can do to make your home safe and to help prevent falls. What can I do on the outside of my home?  Regularly fix the edges of walkways and driveways and fix any cracks.  Remove anything that might make you trip as you walk through a door, such as a raised step or threshold.  Trim any bushes or trees on the path to your home.  Use bright outdoor lighting.  Clear any walking paths of anything that might make someone trip, such as rocks or tools.  Regularly check to see if handrails are loose or broken. Make sure that both sides of any steps have handrails.  Any raised decks and porches should have guardrails on the edges.  Have any leaves, snow, or ice cleared regularly.  Use sand or salt on walking paths during winter.  Clean up any spills in your garage right away. This includes oil or grease spills. What can I do in the bathroom?  Use night lights.  Install grab bars by the toilet and in the tub and shower. Do not use towel bars as grab bars.  Use non-skid mats or decals in the tub or shower.  If you need to sit down in the shower, use a plastic, non-slip stool.  Keep the floor dry. Clean up any water that spills on the floor as soon as it happens.  Remove soap buildup in the  tub or shower regularly.  Attach bath mats securely with double-sided non-slip rug tape.  Do not have throw rugs and other things on the floor that can make you trip. What can I do in the bedroom?  Use night lights.  Make sure that you have a light by your bed that is easy to reach.  Do not use any sheets or blankets that are too big for your bed. They should not hang down onto the floor.  Have a firm chair that has side arms. You can use this for support while you get dressed.  Do not have throw rugs and other things on the floor that can make you trip. What can I do in the kitchen?  Clean up any spills right away.  Avoid walking on wet floors.  Keep items that you use a lot in easy-to-reach places.  If you need to reach something above you, use a strong step stool that has a grab bar.  Keep electrical cords out of the way.  Do not use floor polish or wax that makes floors slippery. If you must use wax, use non-skid floor wax.  Do not  have throw rugs and other things on the floor that can make you trip. What can I do with my stairs?  Do not leave any items on the stairs.  Make sure that there are handrails on both sides of the stairs and use them. Fix handrails that are broken or loose. Make sure that handrails are as long as the stairways.  Check any carpeting to make sure that it is firmly attached to the stairs. Fix any carpet that is loose or worn.  Avoid having throw rugs at the top or bottom of the stairs. If you do have throw rugs, attach them to the floor with carpet tape.  Make sure that you have a light switch at the top of the stairs and the bottom of the stairs. If you do not have them, ask someone to add them for you. What else can I do to help prevent falls?  Wear shoes that:  Do not have high heels.  Have rubber bottoms.  Are comfortable and fit you well.  Are closed at the toe. Do not wear sandals.  If you use a stepladder:  Make sure that it  is fully opened. Do not climb a closed stepladder.  Make sure that both sides of the stepladder are locked into place.  Ask someone to hold it for you, if possible.  Clearly mark and make sure that you can see:  Any grab bars or handrails.  First and last steps.  Where the edge of each step is.  Use tools that help you move around (mobility aids) if they are needed. These include:  Canes.  Walkers.  Scooters.  Crutches.  Turn on the lights when you go into a dark area. Replace any light bulbs as soon as they burn out.  Set up your furniture so you have a clear path. Avoid moving your furniture around.  If any of your floors are uneven, fix them.  If there are any pets around you, be aware of where they are.  Review your medicines with your doctor. Some medicines can make you feel dizzy. This can increase your chance of falling. Ask your doctor what other things that you can do to help prevent falls. This information is not intended to replace advice given to you by your health care provider. Make sure you discuss any questions you have with your health care provider. Document Released: 01/05/2009 Document Revised: 08/17/2015 Document Reviewed: 04/15/2014 Elsevier Interactive Patient Education  2017 [employer].

## 2017-04-17 NOTE — Progress Notes (Signed)
Subjective:   Roger Gutierrez is a 77 y.o. male who presents for Medicare Annual/Subsequent preventive examination.  Review of Systems:  N/A  Cardiac Risk Factors include: advanced age (>7955men, 4>65 women);smoking/ tobacco exposure;dyslipidemia;hypertension;male gender     Objective:    Vitals: BP 94/68 (BP Location: Left Arm)   Pulse 80   Temp 97.6 F (36.4 C) (Oral)   Ht 5\' 5"  (1.651 m)   Wt 107 lb 6.4 oz (48.7 kg)   BMI 17.87 kg/m   Body mass index is 17.87 kg/m.  Advanced Directives 04/17/2017 03/27/2016 05/09/2015 01/13/2015 12/16/2014  Does Patient Have a Medical Advance Directive? Yes Yes Yes Yes Yes  Type of Estate agentAdvance Directive Healthcare Power of [place];Living will Living will;Healthcare Power of Attorney Living will;Healthcare Power of Attorney Living will;Healthcare Power of Attorney Living will;Healthcare Power of Attorney  Copy of Healthcare Power of Attorney in Chart? No - copy requested No - copy requested - - -    Tobacco Social History   Tobacco Use  Smoking Status Current Some Day Smoker  . Types: Cigars  . Last attempt to quit: 03/25/2003  . Years since quitting: 14.0  Smokeless Tobacco Never Used     Ready to quit: No Counseling given: Yes   Clinical Intake:  Pre-visit preparation completed: Yes  Pain : No/denies pain Pain Score: 0-No pain     Nutritional Status: BMI <19  Underweight Nutritional Risks: None Diabetes: No  How often do you need to have someone help you when you read instructions, pamphlets, or other written materials from your doctor or pharmacy?: 1 - Never  Interpreter Needed?: No  Information entered by :: [facility] TennesseeMmarkoski, LPN  Past Medical History:  Diagnosis Date  . Allergy   . Cataract    removed  . COPD (chronic obstructive pulmonary disease) (HCC)   . Hyperlipidemia   . Hypertension   . Myocardial infarction (HCC)   . Osteoporosis    Past Surgical History:  Procedure Laterality Date  . ANGIOPLASTY     cardiac;  3 stents  . ankel fusion Left 1958  . EYE SURGERY Bilateral 08/2011 and 09/2011   cataract extraction  . TONSILLECTOMY AND ADENOIDECTOMY  1950's   Family History  Problem Relation Age of Onset  . Hypertension Mother   . Healthy Sister    Social History   Socioeconomic History  . Marital status: Married    Spouse name: None  . Number of children: 2  . Years of education: None  . Highest education level: Some college, no degree  Social Needs  . Financial resource strain: Not hard at all  . Food insecurity - worry: Never true  . Food insecurity - inability: Never true  . Transportation needs - medical: No  . Transportation needs - non-medical: No  Occupational History  . Occupation: retired  Tobacco Use  . Smoking status: Current Some Day Smoker    Types: Cigars    Last attempt to quit: 03/25/2003    Years since quitting: 14.0  . Smokeless tobacco: Never Used  Substance and Sexual Activity  . Alcohol use: Yes    Comment: occasionally beer 1-2/monthly  . Drug use: No  . Sexual activity: None    Comment: 1 cigar daily  Other Topics Concern  . None  Social History Narrative  . None    Outpatient Encounter Medications as of 04/17/2017  Medication Sig  . albuterol (PROVENTIL HFA;VENTOLIN HFA) 108 (90 Base) MCG/ACT inhaler Inhale 2 puffs into the lungs  every 6 (six) hours as needed for wheezing or shortness of breath.  Marland Kitchen aspirin 81 MG tablet Take 1 tablet by mouth daily.  Marland Kitchen ibandronate (BONIVA) 150 MG tablet Take 1 tablet (150 mg total) by mouth every 30 (thirty) days.  Marland Kitchen ibuprofen (ADVIL,MOTRIN) 200 MG tablet Take 400 mg by mouth as needed.  . pravastatin (PRAVACHOL) 80 MG tablet Take 80 mg by mouth daily.  . ramipril (ALTACE) 5 MG capsule Take 1 capsule by mouth daily.  . Vitamin D, Ergocalciferol, (DRISDOL) 50000 units CAPS capsule Take 1 capsule (50,000 Units total) by mouth every 7 (seven) days.  . [DISCONTINUED] mirtazapine (REMERON) 7.5 MG tablet Take 1 tablet (7.5 mg  total) by mouth at bedtime.   No facility-administered encounter medications on file as of 04/17/2017.     Activities of Daily Living In your present state of health, do you have any difficulty performing the following activities: 04/17/2017  Hearing? N  Vision? N  Difficulty concentrating or making decisions? N  Walking or climbing stairs? N  Dressing or bathing? N  Doing errands, shopping? N  Preparing Food and eating ? N  Using the Toilet? N  In the past six months, have you accidently leaked urine? N  Do you have problems with loss of bowel control? N  Managing your Medications? N  Managing your Finances? N  Housekeeping or managing your Housekeeping? N  Some recent data might be hidden    Patient Care Team: Malva Limes, MD as PCP - General (Family Medicine) Lamar Blinks, MD as Consulting Physician (Cardiology)   Assessment:   This is a routine wellness examination for [place].  Exercise Activities and Dietary recommendations Current Exercise Habits: The patient does not participate in regular exercise at present, Exercise limited by: None identified  Goals    . DIET - INCREASE WATER INTAKE     Recommend increasing water intake to 4 glasses a day. Try substituting a cup of coffee for a glass of water.        Fall Risk Fall Risk  04/17/2017 03/27/2016 07/19/2015  Falls in the past year? No No No   Is the patient's home free of loose throw rugs in walkways, pet beds, electrical cords, etc?   yes      Grab bars in the bathroom? yes      Handrails on the stairs?   yes      Adequate lighting?   yes  Timed Get Up and Go Performed: N/A  Depression Screen PHQ 2/9 Scores 04/17/2017 04/17/2017 03/27/2016 07/19/2015  PHQ - 2 Score 0 0 0 0  PHQ- 9 Score 0 - - -    Cognitive Function: Pt declined screening today.      6CIT Screen 03/27/2016  What Year? 0 points  What month? 0 points  What time? 0 points  Count back from 20 0 points  Months in reverse 0 points  Repeat  phrase 6 points  Total Score 6    Immunization History  Administered Date(s) Administered  . Influenza, High Dose Seasonal PF 01/13/2015  . Pneumococcal Conjugate-13 04/29/2014  . Pneumococcal Polysaccharide-23 11/19/2007  . Zoster 03/03/2012    Qualifies for Shingles Vaccine? Due for Shingles vaccine. Declined my offer to administer today. Education has been provided regarding the importance of this vaccine. Pt has been advised to call her insurance company to determine her out of pocket expense. Advised she may also receive this vaccine at her local pharmacy or Health Dept. Verbalized  acceptance and understanding.  Screening Tests Health Maintenance  Topic Date Due  . TETANUS/TDAP  10/29/2017 (Originally 05/10/1959)  . COLONOSCOPY  02/01/2018  . INFLUENZA VACCINE  Completed  . PNA vac Low Risk Adult  Completed   Cancer Screenings: Lung: Low Dose CT Chest recommended if Age 77-80 years, 30 pack-year currently smoking OR have quit w/in 15years. Patient does qualify but states he thinks he may have had this already by his cardiologist. Pt to check on this and will let us know if not completed. Will place order if so.  Colorectal: Up to date  Additional Screenings:  Hepatitis B/HIV/Syphillis: Pt declines today.  Hepatitis C Screening: Pt declines today.     Plan:  I have personally reviewed and addressed the Medicare Annual Wellness questionnaire and have noted the following in the patient's chart:  A. Medical and social history B. Use of alcohol, tobacco or illicit drugs  C. Current medications and supplements D. Functional ability and status E.  Nutritional status F.  Physical activity G. Advance directives H. List of other physicians I.  Hospitalizations, surgeries, and ER visits in previous 12 months J.  Vitals K. Screenings such as hearing and vision if needed, cognitive and depression L. Referrals and appointments - none  In addition, I have reviewed and discussed with  patient certain preventive protocols, quality metrics, and best practice recommendations. A written personalized care plan for preventive services as well as general preventive health recommendations were provided to patient.  See attached scanned questionnaire for additional information.   Signed,  Hyacinth Meeker, LPN Nurse Health Advisor   Nurse Recommendations: None.

## 2017-04-23 ENCOUNTER — Encounter: Payer: Self-pay | Admitting: Family Medicine

## 2017-04-23 ENCOUNTER — Ambulatory Visit (INDEPENDENT_AMBULATORY_CARE_PROVIDER_SITE_OTHER): Payer: Medicare Other | Admitting: Family Medicine

## 2017-04-23 VITALS — BP 96/64 | HR 96 | Temp 97.7°F | Resp 16 | Ht 65.0 in | Wt 108.2 lb

## 2017-04-23 DIAGNOSIS — Z0001 Encounter for general adult medical examination with abnormal findings: Secondary | ICD-10-CM

## 2017-04-23 DIAGNOSIS — M810 Age-related osteoporosis without current pathological fracture: Secondary | ICD-10-CM | POA: Diagnosis not present

## 2017-04-23 DIAGNOSIS — E7800 Pure hypercholesterolemia, unspecified: Secondary | ICD-10-CM

## 2017-04-23 DIAGNOSIS — I10 Essential (primary) hypertension: Secondary | ICD-10-CM | POA: Diagnosis not present

## 2017-04-23 DIAGNOSIS — E559 Vitamin D deficiency, unspecified: Secondary | ICD-10-CM | POA: Diagnosis not present

## 2017-04-23 DIAGNOSIS — M419 Scoliosis, unspecified: Secondary | ICD-10-CM

## 2017-04-23 DIAGNOSIS — Z0000 Encounter for general adult medical examination without abnormal findings: Secondary | ICD-10-CM

## 2017-04-23 NOTE — Progress Notes (Deleted)
Patient: Roger Gutierrez, Male    DOB: 1940-09-14, 77 y.o.   MRN: [id number] Visit Date: 04/23/2017  Today's Provider: [name], MD   Chief Complaint  Patient presents with  . Annual Exam   Subjective:    Annual physical exam Roger Salaamaul M Conrad is a 77 y.o. male who presents today for health maintenance and complete physical. He feels well. He reports exercising none. He reports he is sleeping well. He denies any issues or concerns today.  ----------------------------------------------------------------- Patient had AWE on 04/17/17   Review of Systems  Constitutional: Negative.   HENT: Negative.   Eyes: Negative.   Respiratory: Negative.   Cardiovascular: Negative.   Gastrointestinal: Negative.   Endocrine: Negative.   Genitourinary: Negative.   Musculoskeletal: Negative.   Skin: Negative.   Allergic/Immunologic: Negative.   Neurological: Negative.   Hematological: Negative.   Psychiatric/Behavioral: Negative.     Social History      He  reports that he has been smoking cigars.  he has never used smokeless tobacco. He reports that he drinks alcohol. He reports that he does not use drugs.       Social History   Socioeconomic History  . Marital status: Married    Spouse name: None  . Number of children: 2  . Years of education: None  . Highest education level: Some college, no degree  Social Needs  . Financial resource strain: Not hard at all  . Food insecurity - worry: Never true  . Food insecurity - inability: Never true  . Transportation needs - medical: No  . Transportation needs - non-medical: No  Occupational History  . Occupation: retired  Tobacco Use  . Smoking status: Current Some Day Smoker    Types: Cigars    Last attempt to quit: 03/25/2003    Years since quitting: 14.0  . Smokeless tobacco: Never Used  Substance and Sexual Activity  . Alcohol use: Yes    Comment: occasionally beer 1-2/monthly  . Drug use: No  . Sexual activity: None      Comment: 1 cigar daily  Other Topics Concern  . None  Social History Narrative  . None    Past Medical History:  Diagnosis Date  . Allergy   . Cataract    removed  . COPD (chronic obstructive pulmonary disease) (HCC)   . Hyperlipidemia   . Hypertension   . Myocardial infarction (HCC)   . Osteoporosis      Patient Active Problem List   Diagnosis Date Noted  . Bilateral carotid artery stenosis 12/16/2016  . Inferior myocardial infarction (HCC) 12/16/2016  . Depression 10/29/2016  . Insomnia 10/29/2016  . Vitamin D deficiency 03/29/2016  . Cigar smoker 01/13/2015  . Arteriosclerosis of coronary artery 12/16/2014  . Anorexia 12/15/2014  . Long term current use of systemic steroids 12/15/2014  . CAFL (chronic airflow limitation) (HCC) 12/15/2014  . Failure of erection 12/15/2014  . Hypercholesteremia 12/15/2014  . OP (osteoporosis) 12/15/2014  . Ischemic cardiomyopathy 06/03/2014  . Abnormal loss of weight 04/03/2009  . H/O acute poliomyelitis 12/20/2007  . Scoliosis 12/20/2007  . Benign hypertension 07/09/2007    Past Surgical History:  Procedure Laterality Date  . ANGIOPLASTY     cardiac; 3 stents  . ankel fusion Left 1958  . EYE SURGERY Bilateral 08/2011 and 09/2011   cataract extraction  . TONSILLECTOMY AND ADENOIDECTOMY  1950's    Family History        Family Status  Relation Name Status  . Mother  Deceased  . Father  Deceased       old age  . Sister  Alive  . Brother  Deceased       unknown        His family history includes Healthy in his sister; Hypertension in his mother.     Allergies  Allergen Reactions  . Megace  [Megestrol] Shortness Of Breath and Palpitations     Current Outpatient Medications:  .  albuterol (PROVENTIL HFA;VENTOLIN HFA) 108 (90 Base) MCG/ACT inhaler, Inhale 2 puffs into the lungs every 6 (six) hours as needed for wheezing or shortness of breath., Disp: 1 Inhaler, Rfl: 2 .  aspirin 81 MG tablet, Take 1 tablet by mouth  daily., Disp: , Rfl:  .  ibandronate (BONIVA) 150 MG tablet, Take 1 tablet (150 mg total) by mouth every 30 (thirty) days., Disp: 3 tablet, Rfl: 3 .  ibuprofen (ADVIL,MOTRIN) 200 MG tablet, Take 400 mg by mouth as needed., Disp: , Rfl:  .  pravastatin (PRAVACHOL) 80 MG tablet, Take 80 mg by mouth daily., Disp: , Rfl:  .  ramipril (ALTACE) 5 MG capsule, Take 1 capsule by mouth daily., Disp: , Rfl:  .  Vitamin D, Ergocalciferol, (DRISDOL) 50000 units CAPS capsule, Take 1 capsule (50,000 Units total) by mouth every 7 (seven) days., Disp: 12 capsule, Rfl: 4   Patient Care Team: Malva Limes, MD as PCP - General (Family Medicine) Lamar Blinks, MD as Consulting Physician (Cardiology)      Objective:   Vitals: BP 96/64 (BP Location: Right Arm, Patient Position: Sitting, Cuff Size: Normal)   Pulse 96   Temp 97.7 F (36.5 C) (Oral)   Resp 16   Ht 5\' 5"  (1.651 m)   Wt 108 lb 3.2 oz (49.1 kg)   SpO2 98%   BMI 18.01 kg/m    Physical Exam   Depression Screen PHQ 2/9 Scores 04/17/2017 04/17/2017 03/27/2016 07/19/2015  PHQ - 2 Score 0 0 0 0  PHQ- 9 Score 0 - - -      Assessment & Plan:     Routine Health Maintenance and Physical Exam  Exercise Activities and Dietary recommendations Goals    . DIET - INCREASE WATER INTAKE     Recommend increasing water intake to 4 glasses a day. Try substituting a cup of coffee for a glass of water.        Immunization History  Administered Date(s) Administered  . Influenza, High Dose Seasonal PF 01/13/2015  . Influenza-Unspecified 02/03/2017  . Pneumococcal Conjugate-13 04/29/2014  . Pneumococcal Polysaccharide-23 11/19/2007  . Zoster 03/03/2012    Health Maintenance  Topic Date Due  . TETANUS/TDAP  10/29/2017 (Originally 05/10/1959)  . COLONOSCOPY  02/01/2018  . INFLUENZA VACCINE  Completed  . PNA vac Low Risk Adult  Completed     Discussed health benefits of physical activity, and encouraged him to engage in regular exercise  appropriate for his age and condition.    --------------------------------------------------------------------    [name], MD  [facility]

## 2017-04-23 NOTE — Progress Notes (Signed)
Patient: Roger Gutierrez, Male    DOB: [birth year], 77 y.o.   MRN: [id number] Visit Date: 04/23/2017  Today's Provider: Mila Merryonald Neveyah Garzon, MD   Chief Complaint  Patient presents with  . Annual Exam   Subjective:     Complete Physical Roger Gutierrez is a 77 y.o. male. He feels well. He reports exercising none. He reports he is sleeping well.  -----------------------------------------------------------   Hypertension, follow-up:  BP Readings from Last 3 Encounters:  04/23/17 96/64  04/17/17 94/68  11/26/16 110/72    He was last seen for hypertension 1 years ago.  BP at that visit was 114/72. Management since that visit includes; no changes.He reports good compliance with treatment. He is not having side effects.  He is not exercising. He is adherent to low salt diet.   Outside blood pressures are normal.  ----------------------------------------------------------------   Lipid/Cholesterol, Follow-up:   Last seen for this 1 years ago.  Management since that visit includes; labs checked, no cahnges.  Last Lipid Panel:    Component Value Date/Time   CHOL 187 03/28/2016 0853   TRIG 111 03/28/2016 0853   HDL 58 03/28/2016 0853   CHOLHDL 3.2 03/28/2016 0853   LDLCALC 107 (H) 03/28/2016 0853    He reports good compliance with treatment. He is not having side effects.   Wt Readings from Last 3 Encounters:  04/23/17 108 lb 3.2 oz (49.1 kg)  04/17/17 107 lb 6.4 oz (48.7 kg)  11/26/16 111 lb (50.3 kg)    ----------------------------------------------------------------  Arteriosclerosis of coronary artery Continues routine follow up Dr. Gwen PoundsKowalski who last saw him in September. He denies any chest pain, dyspnea, or palpitations. Is doing well with medications without adverse effects.   Chronic obstructive pulmonary disease, unspecified COPD type (HCC) He rarely   Vitamin D deficiency From 05/29/2016-labs checked, no changes.   Lab Results  Component Value  Date   VD25OH 54.8 05/29/2016     Osteoporosis, unspecified osteoporosis type, unspecified pathological fracture presence Continues on monthly boniva which has been taking since around 2014. Has been several years since he had BMD. No adverse effects from medication. Has now been off of prednisone completely since September.   Abnormal loss of weight From 11/26/2016. He had been on prednisone for years, but stopped last year and weight didn't really change. Was then prescribed mirtazapine at bedtime, but he stopped because it made him extremely sleepy during the day. Tried the 7.4370m and 15mg  dose with same side effect.   Other depression He feels like he is doing fine without medications at this time. Didn't like mirtazapine due to sleepiness as above.   Insomnia, unspecified type .  Wt Readings from Last 5 Encounters:  04/23/17 108 lb 3.2 oz (49.1 kg)  04/17/17 107 lb 6.4 oz (48.7 kg)  11/26/16 111 lb (50.3 kg)  10/29/16 110 lb 3.2 oz (50 kg)  09/24/16 106 lb 12.8 oz (48.4 kg)      Review of Systems  Constitutional: Negative.   HENT: Negative.   Eyes: Negative.   Respiratory: Negative.   Cardiovascular: Negative.   Gastrointestinal: Negative.   Endocrine: Negative.   Genitourinary: Negative.   Musculoskeletal: Negative.   Skin: Negative.   Allergic/Immunologic: Negative.   Neurological: Negative.   Hematological: Negative.   Psychiatric/Behavioral: Negative.     Social History   Socioeconomic History  . Marital status: Married    Spouse name: Not on file  . Number of children: 2  .  Years of education: Not on file  . Highest education level: Some college, no degree  Social Needs  . Financial resource strain: Not hard at all  . Food insecurity - worry: Never true  . Food insecurity - inability: Never true  . Transportation needs - medical: No  . Transportation needs - non-medical: No  Occupational History  . Occupation: retired  Tobacco Use  . Smoking status:  Current Some Day Smoker    Types: Cigars    Last attempt to quit: 03/25/2003    Years since quitting: 14.0  . Smokeless tobacco: Never Used  Substance and Sexual Activity  . Alcohol use: Yes    Comment: occasionally beer 1-2/monthly  . Drug use: No  . Sexual activity: Not on file    Comment: 1 cigar daily  Other Topics Concern  . Not on file  Social History Narrative  . Not on file    Past Medical History:  Diagnosis Date  . Allergy   . Cataract    removed  . COPD (chronic obstructive pulmonary disease) (HCC)   . Hyperlipidemia   . Hypertension   . Myocardial infarction (HCC)   . Osteoporosis      Patient Active Problem List   Diagnosis Date Noted  . Bilateral carotid artery stenosis 12/16/2016  . Inferior myocardial infarction (HCC) 12/16/2016  . Depression 10/29/2016  . Insomnia 10/29/2016  . Vitamin D deficiency 03/29/2016  . Cigar smoker 01/13/2015  . Arteriosclerosis of coronary artery 12/16/2014  . Anorexia 12/15/2014  . Long term current use of systemic steroids 12/15/2014  . CAFL (chronic airflow limitation) (HCC) 12/15/2014  . Failure of erection 12/15/2014  . Hypercholesteremia 12/15/2014  . OP (osteoporosis) 12/15/2014  . Ischemic cardiomyopathy 06/03/2014  . Abnormal loss of weight 04/03/2009  . H/O acute poliomyelitis 12/20/2007  . Scoliosis 12/20/2007  . Benign hypertension 07/09/2007    Past Surgical History:  Procedure Laterality Date  . ANGIOPLASTY     cardiac; 3 stents  . ankel fusion Left 1958  . EYE SURGERY Bilateral 08/2011 and 09/2011   cataract extraction  . TONSILLECTOMY AND ADENOIDECTOMY  1950's    His family history includes Healthy in his sister; Hypertension in his mother.      Current Outpatient Medications:  .  albuterol (PROVENTIL HFA;VENTOLIN HFA) 108 (90 Base) MCG/ACT inhaler, Inhale 2 puffs into the lungs every 6 (six) hours as needed for wheezing or shortness of breath., Disp: 1 Inhaler, Rfl: 2 .  aspirin 81 MG tablet,  Take 1 tablet by mouth daily., Disp: , Rfl:  .  ibandronate (BONIVA) 150 MG tablet, Take 1 tablet (150 mg total) by mouth every 30 (thirty) days., Disp: 3 tablet, Rfl: 3 .  ibuprofen (ADVIL,MOTRIN) 200 MG tablet, Take 400 mg by mouth as needed., Disp: , Rfl:  .  pravastatin (PRAVACHOL) 80 MG tablet, Take 80 mg by mouth daily., Disp: , Rfl:  .  ramipril (ALTACE) 5 MG capsule, Take 1 capsule by mouth daily., Disp: , Rfl:  .  Vitamin D, Ergocalciferol, (DRISDOL) 50000 units CAPS capsule, Take 1 capsule (50,000 Units total) by mouth every 7 (seven) days., Disp: 12 capsule, Rfl: 4  Patient Care Team: Malva Limes, MD as PCP - General (Family Medicine) Lamar Blinks, MD as Consulting Physician (Cardiology)     Objective:   Vitals: BP 96/64 (BP Location: Right Arm, Patient Position: Sitting, Cuff Size: Normal)   Pulse 96   Temp 97.7 F (36.5 C) (Oral)   Resp  16   Ht 5\' 5"  (1.651 m)   Wt 108 lb 3.2 oz (49.1 kg)   SpO2 98%   BMI 18.01 kg/m   Physical Exam   General Appearance:    Alert, cooperative, no distress, appears stated age  Head:    Normocephalic, without obvious abnormality, atraumatic  Eyes:    PERRL, conjunctiva/corneas clear, EOM's intact, fundi    benign, both eyes       Ears:    Normal TM's and external ear canals, both ears  Nose:   Nares normal, septum midline, mucosa normal, no drainage   or sinus tenderness  Throat:   Lips, mucosa, and tongue normal; teeth and gums normal  Neck:   Supple, symmetrical, trachea midline, no adenopathy;       thyroid:  No enlargement/tenderness/nodules; no carotid   bruit or JVD  Back:     Symmetric, no curvature, ROM normal, no CVA tenderness  Lungs:     Clear to auscultation bilaterally, respirations unlabored  Chest wall:    No tenderness or deformity  Heart:    Regular rate and rhythm, S1 and S2 normal, no murmur, rub   or gallop  Abdomen:     Soft, non-tender, bowel sounds active all four quadrants,    no masses, no  organomegaly  Genitalia:    deferred  Rectal:    deferred  MS:   Markedly kyphotic and scoliotic spine right of midline. He states this is unchanged for several years.   Pulses:   2+ and symmetric all extremities  Skin:   Skin color, texture, turgor normal, no rashes or lesions  Lymph nodes:   Cervical, supraclavicular, and axillary nodes normal  Neurologic:   CNII-XII intact. Normal strength, sensation and reflexes      throughout    Activities of Daily Living In your present state of health, do you have any difficulty performing the following activities: 04/17/2017  Hearing? N  Vision? N  Difficulty concentrating or making decisions? N  Walking or climbing stairs? N  Dressing or bathing? N  Doing errands, shopping? N  Preparing Food and eating ? N  Using the Toilet? N  In the past six months, have you accidently leaked urine? N  Do you have problems with loss of bowel control? N  Managing your Medications? N  Managing your Finances? N  Housekeeping or managing your Housekeeping? N  Some recent data might be hidden    Fall Risk Assessment Fall Risk  04/17/2017 03/27/2016 07/19/2015  Falls in the past year? No No No     Depression Screen PHQ 2/9 Scores 04/17/2017 04/17/2017 03/27/2016 07/19/2015  PHQ - 2 Score 0 0 0 0  PHQ- 9 Score 0 - - -     Assessment & Plan:    Annual Physical Reviewed patient's Family Medical History Reviewed and updated list of patient's medical providers Assessment of cognitive impairment was done Assessed patient's functional ability Established a written schedule for health screening services Health Risk Assessent Completed and Reviewed  Exercise Activities and Dietary recommendations Goals    . DIET - INCREASE WATER INTAKE     Recommend increasing water intake to 4 glasses a day. Try substituting a cup of coffee for a glass of water.        Immunization History  Administered Date(s) Administered  . Influenza, High Dose Seasonal PF  01/13/2015  . Influenza-Unspecified 02/03/2017  . Pneumococcal Conjugate-13 04/29/2014  . Pneumococcal Polysaccharide-23 11/19/2007  . Zoster 03/03/2012  Health Maintenance  Topic Date Due  . TETANUS/TDAP  10/29/2017 (Originally 05/10/1961)  . COLONOSCOPY  02/01/2018  . INFLUENZA VACCINE  Completed  . PNA vac Low Risk Adult  Completed     Discussed health benefits of physical activity, and encouraged him to engage in regular exercise appropriate for his age and condition.    ------------------------------------------------------------------------------------------------------------  1. Annual physical exam Generally doing well, unremarkable exam. Recommended tetanus vaccine which he refused.   2. Osteoporosis, unspecified osteoporosis type, unspecified pathological fracture presence Has been on bisphosphonate for at least 5 years and no long on prednisone. Consider discontinuation of of Boniva of BMD is stable.  - DG Bone Density; Future  3. Vitamin D deficiency  - VITAMIN D 25 Hydroxy (Vit-D Deficiency, Fractures)  4. Benign hypertension Well controlled.  Continue current medications.   - Comprehensive metabolic panel  5. Hypercholesteremia He is tolerating pravastatin well with no adverse effects.   - Comprehensive metabolic panel - Lipid panel  6. Scoliosis of thoracolumbar spine, unspecified scoliosis type Stable.    [name], MD  [facility]

## 2017-04-23 NOTE — Patient Instructions (Signed)
   The CDC recommends two doses of Shingrix (the shingles vaccine) separated by 2 to 6 months for adults age 75 years and older. I recommend checking with your insurance plan regarding coverage for this vaccine.   

## 2017-04-25 LAB — COMPREHENSIVE METABOLIC PANEL
A/G RATIO: 2.2 (ref 1.2–2.2)
ALK PHOS: 59 IU/L (ref 39–117)
ALT: 14 IU/L (ref 0–44)
AST: 19 IU/L (ref 0–40)
Albumin: 4.4 g/dL (ref 3.5–4.8)
BUN/Creatinine Ratio: 13 (ref 10–24)
BUN: 11 mg/dL (ref 8–27)
Bilirubin Total: 0.5 mg/dL (ref 0.0–1.2)
CALCIUM: 9.3 mg/dL (ref 8.6–10.2)
CO2: 23 mmol/L (ref 20–29)
Chloride: 102 mmol/L (ref 96–106)
Creatinine, Ser: 0.86 mg/dL (ref 0.76–1.27)
GFR calc Af Amer: 99 mL/min/{1.73_m2} (ref >59)
GFR calc non Af Amer: 85 mL/min/{1.73_m2} (ref >59)
GLOBULIN, TOTAL: 2 g/dL (ref 1.5–4.5)
Glucose: 95 mg/dL (ref 65–99)
POTASSIUM: 4.6 mmol/L (ref 3.5–5.2)
SODIUM: 142 mmol/L (ref 134–144)
Total Protein: 6.4 g/dL (ref 6.0–8.5)

## 2017-04-25 LAB — LIPID PANEL
CHOLESTEROL TOTAL: 142 mg/dL (ref 100–199)
Chol/HDL Ratio: 2.5 ratio (ref 0.0–5.0)
HDL: 57 mg/dL (ref >39)
LDL Calculated: 70 mg/dL (ref 0–99)
TRIGLYCERIDES: 77 mg/dL (ref 0–149)
VLDL Cholesterol Cal: 15 mg/dL (ref 5–40)

## 2017-04-25 LAB — VITAMIN D 25 HYDROXY (VIT D DEFICIENCY, FRACTURES): Vit D, 25-Hydroxy: 83.5 ng/mL (ref 30.0–100.0)

## 2017-05-14 ENCOUNTER — Ambulatory Visit
Admission: RE | Admit: 2017-05-14 | Discharge: 2017-05-14 | Disposition: A | Discharge status: Home / Self Care | Payer: Medicare Other | Source: Ambulatory Visit | Attending: Family Medicine | Admitting: Family Medicine

## 2017-05-14 DIAGNOSIS — M810 Age-related osteoporosis without current pathological fracture: Secondary | ICD-10-CM | POA: Diagnosis present

## 2017-05-14 DIAGNOSIS — M4186 Other forms of scoliosis, lumbar region: Secondary | ICD-10-CM | POA: Insufficient documentation

## 2017-05-14 DIAGNOSIS — M818 Other osteoporosis without current pathological fracture: Secondary | ICD-10-CM | POA: Insufficient documentation

## 2017-05-20 ENCOUNTER — Encounter: Payer: Self-pay | Admitting: Family Medicine

## 2017-05-20 ENCOUNTER — Ambulatory Visit (INDEPENDENT_AMBULATORY_CARE_PROVIDER_SITE_OTHER): Payer: Medicare Other | Admitting: Family Medicine

## 2017-05-20 VITALS — BP 108/64 | HR 92 | Temp 97.9°F | Resp 16 | Wt 108.0 lb

## 2017-05-20 DIAGNOSIS — L03312 Cellulitis of back [any part except buttock]: Secondary | ICD-10-CM

## 2017-05-20 MED ORDER — CEPHALEXIN 500 MG PO CAPS: 500.0000 mg | ORAL_CAPSULE | Freq: Three times a day (TID) | ORAL | 0 refills | Status: AC

## 2017-05-20 MED ORDER — CLOTRIMAZOLE-BETAMETHASONE 1-0.05 % EX CREA: 1.0000 "application " | TOPICAL_CREAM | Freq: Two times a day (BID) | CUTANEOUS | 0 refills | Status: AC | PRN

## 2017-05-20 NOTE — Progress Notes (Signed)
Patient: Roger Gutierrez Male    DOB: Jan 13, 1941   77 y.o.   MRN: [id number] Visit Date: 05/20/2017  Today's Provider: Mila Merryonald Blondie Riggsbee, MD   Chief Complaint  Patient presents with  . Rash   Subjective:    Rash  This is a new problem. The current episode started 1 to 4 weeks ago (2 weeks ago). The problem is unchanged. The affected locations include the right shoulder. The rash is characterized by dryness, itchiness and redness. He was exposed to nothing. Past treatments include anti-itch cream (Cortisone 10 and Eucerin anti itch cream). The treatment provided no relief. There is no history of allergies, asthma or eczema.       Allergies  Allergen Reactions  . Megace  [Megestrol] Shortness Of Breath and Palpitations     Current Outpatient Medications:  .  albuterol (PROVENTIL HFA;VENTOLIN HFA) 108 (90 Base) MCG/ACT inhaler, Inhale 2 puffs into the lungs every 6 (six) hours as needed for wheezing or shortness of breath., Disp: 1 Inhaler, Rfl: 2 .  aspirin 81 MG tablet, Take 1 tablet by mouth daily., Disp: , Rfl:  .  ibandronate (BONIVA) 150 MG tablet, Take 1 tablet (150 mg total) by mouth every 30 (thirty) days., Disp: 3 tablet, Rfl: 3 .  ibuprofen (ADVIL,MOTRIN) 200 MG tablet, Take 400 mg by mouth as needed., Disp: , Rfl:  .  pravastatin (PRAVACHOL) 80 MG tablet, Take 80 mg by mouth daily., Disp: , Rfl:  .  ramipril (ALTACE) 5 MG capsule, Take 1 capsule by mouth daily., Disp: , Rfl:  .  Vitamin D, Ergocalciferol, (DRISDOL) 50000 units CAPS capsule, Take 1 capsule (50,000 Units total) by mouth every 7 (seven) days., Disp: 12 capsule, Rfl: 4  Review of Systems  Constitutional: Negative.   Musculoskeletal: Negative.   Skin: Positive for rash.  Allergic/Immunologic: Negative.     Social History   Tobacco Use  . Smoking status: Current Some Day Smoker    Types: Cigars    Last attempt to quit: 03/25/2003    Years since quitting: 14.1  . Smokeless tobacco: Never Used  .  Tobacco comment: QUIT CIGARETTES 2004 only smoked 4-5 years.   Substance Use Topics  . Alcohol use: Yes    Comment: occasionally beer 1-2/monthly   Objective:   BP 108/64 (BP Location: Right Arm, Patient Position: Sitting, Cuff Size: Normal)   Pulse 92   Temp 97.9 F (36.6 C)   Resp 16   Wt 108 lb (49 kg)   SpO2 98%   BMI 17.97 kg/m  Vitals:   05/20/17 1036  BP: 108/64  Pulse: 92  Resp: 16  Temp: 97.9 F (36.6 C)  SpO2: 98%  Weight: 108 lb (49 kg)     Physical Exam  General appearance: alert, well developed, well nourished, cooperative and in no distress Head: Normocephalic, without obvious abnormality, atraumatic Respiratory: Respirations even and unlabored, normal respiratory rate Skin: About 4cm oval erythematous scaly patch with scattered areas of clearing on upper posterior left shoulder, slightly tender and swollen.      Assessment & Plan:     1. Cellulitis of back except buttock Likely secondary to superficial fungal skin infection.  - clotrimazole-betamethasone (LOTRISONE) cream; Apply 1 application topically 2 (two) times daily as needed for up to 10 days.  Dispense: 30 g; Refill: 0 - cephALEXin (KEFLEX) 500 MG capsule; Take 1 capsule (500 mg total) by mouth 3 (three) times daily for 7 days.  Dispense: 21  capsule; Refill: 0  Call if symptoms change or if not rapidly improving.          [name], MD  [facility]

## 2017-06-04 ENCOUNTER — Other Ambulatory Visit: Payer: Self-pay | Admitting: Family Medicine

## 2017-06-20 ENCOUNTER — Other Ambulatory Visit: Payer: Self-pay | Admitting: Family Medicine

## 2017-06-20 DIAGNOSIS — M810 Age-related osteoporosis without current pathological fracture: Secondary | ICD-10-CM

## 2017-10-21 ENCOUNTER — Ambulatory Visit (INDEPENDENT_AMBULATORY_CARE_PROVIDER_SITE_OTHER): Payer: Medicare Other | Admitting: Family Medicine

## 2017-10-21 ENCOUNTER — Encounter: Payer: Self-pay | Admitting: Family Medicine

## 2017-10-21 VITALS — BP 100/67 | HR 93 | Temp 98.0°F | Resp 16 | Ht 65.0 in | Wt 104.0 lb

## 2017-10-21 DIAGNOSIS — L282 Other prurigo: Secondary | ICD-10-CM

## 2017-10-21 DIAGNOSIS — L03119 Cellulitis of unspecified part of limb: Secondary | ICD-10-CM | POA: Diagnosis not present

## 2017-10-21 MED ORDER — CEPHALEXIN 500 MG PO CAPS: 500.0000 mg | ORAL_CAPSULE | Freq: Four times a day (QID) | ORAL | 0 refills | Status: AC

## 2017-10-21 MED ORDER — HYDROXYZINE HCL 10 MG PO TABS: 10.0000 mg | ORAL_TABLET | Freq: Four times a day (QID) | ORAL | 0 refills | Status: DC | PRN

## 2017-10-21 NOTE — Progress Notes (Signed)
       Patient: Roger Gutierrez Male    DOB: 07/23/1940   77 y.o.   MRN: [id number] Visit Date: 10/21/2017  Today's Provider: [name], MD   Chief Complaint  Patient presents with  . Insect Bite   Subjective:    HPI  Patient states he has had insect bites for 4-days. Bites are on arms, legs, back and side. Bites are red, swollen and itchy. Patient states he has been using otc hydrocortisone cream with mild relief.    Allergies  Allergen Reactions  . Megace  [Megestrol] Shortness Of Breath and Palpitations     Current Outpatient Medications:  .  albuterol (PROVENTIL HFA;VENTOLIN HFA) 108 (90 Base) MCG/ACT inhaler, Inhale 2 puffs into the lungs every 6 (six) hours as needed for wheezing or shortness of breath., Disp: 1 Inhaler, Rfl: 2 .  aspirin 81 MG tablet, Take 1 tablet by mouth daily., Disp: , Rfl:  .  ibuprofen (ADVIL,MOTRIN) 200 MG tablet, Take 400 mg by mouth as needed., Disp: , Rfl:  .  pravastatin (PRAVACHOL) 80 MG tablet, Take 80 mg by mouth daily., Disp: , Rfl:  .  ramipril (ALTACE) 5 MG capsule, Take 1 capsule by mouth daily., Disp: , Rfl:  .  Vitamin D, Ergocalciferol, (DRISDOL) 50000 units CAPS capsule, TAKE 1 CAPSULE BY MOUTH EVERY 7 DAYS, Disp: 12 capsule, Rfl: 4 .  ibandronate (BONIVA) 150 MG tablet, TAKE 1 TABLET(150 MG) BY MOUTH EVERY 30 DAYS (Patient not taking: Reported on 10/21/2017), Disp: 3 tablet, Rfl: 4  Review of Systems  Constitutional: Negative for appetite change, chills and fever.  Respiratory: Negative for chest tightness, shortness of breath and wheezing.   Cardiovascular: Negative for palpitations.    Social History   Tobacco Use  . Smoking status: Current Some Day Smoker    Types: Cigars    Last attempt to quit: 03/25/2003    Years since quitting: 14.5  . Smokeless tobacco: Never Used  . Tobacco comment: QUIT CIGARETTES 2004 only smoked 4-5 years.   Substance Use Topics  . Alcohol use: Yes    Comment: occasionally beer 1-2/monthly     Objective:   BP 100/67 (BP Location: Right Arm, Patient Position: Sitting, Cuff Size: Normal)   Pulse 93   Temp 98 F (36.7 C) (Oral)   Resp 16   Ht 5\' 5"  (1.651 m)   Wt 104 lb (47.2 kg)   SpO2 96%   BMI 17.31 kg/m  Vitals:   10/21/17 1543  BP: 100/67  Pulse: 93  Resp: 16  Temp: 98 F (36.7 C)  TempSrc: Oral  SpO2: 96%  Weight: 104 lb (47.2 kg)  Height: 5\' 5"  (1.651 m)     Physical Exam  Scattered papular lesions across back on erythematous base.no discharge.     Assessment & Plan:     1. Papular urticaria Advised potential sedation, but can take 20mg  at night before going to bed.  - hydrOXYzine (ATARAX/VISTARIL) 10 MG tablet; Take 1-2 tablets (10-20 mg total) by mouth every 6 (six) hours as needed for itching.  Dispense: 30 tablet; Refill: 0  2. Cellulitis of upper extremity, unspecified laterality  - cephALEXin (KEFLEX) 500 MG capsule; Take 1 capsule (500 mg total) by mouth 4 (four) times daily for 5 days.  Dispense: 20 capsule; Refill: 0       [name], MD  [facility]

## 2018-01-19 ENCOUNTER — Other Ambulatory Visit: Payer: Self-pay | Admitting: Family Medicine

## 2018-01-19 DIAGNOSIS — J441 Chronic obstructive pulmonary disease with (acute) exacerbation: Secondary | ICD-10-CM

## 2018-04-28 ENCOUNTER — Ambulatory Visit (INDEPENDENT_AMBULATORY_CARE_PROVIDER_SITE_OTHER): Payer: Medicare Other

## 2018-04-28 ENCOUNTER — Ambulatory Visit: Payer: Medicare Other

## 2018-04-28 ENCOUNTER — Ambulatory Visit: Payer: Self-pay | Admitting: Family Medicine

## 2018-04-28 VITALS — BP 112/66 | HR 77 | Temp 99.0°F | Ht 65.0 in | Wt 106.0 lb

## 2018-04-28 DIAGNOSIS — Z0000 Encounter for general adult medical examination without abnormal findings: Secondary | ICD-10-CM | POA: Diagnosis not present

## 2018-04-28 NOTE — Patient Instructions (Signed)
[name] , Thank you for taking time to come for your Medicare Wellness Visit. I appreciate your ongoing commitment to your health goals. Please review the following plan we discussed and let me know if I can assist you in the future.   Screening recommendations/referrals: Colonoscopy: Pt declined colonoscopy referral and cologuard order.  Recommended yearly ophthalmology/optometry visit for glaucoma screening and checkup Recommended yearly dental visit for hygiene and checkup  Vaccinations: Influenza vaccine: Up to date Pneumococcal vaccine: Completed series Tdap vaccine: Pt declines today.  Shingles vaccine: Pt declines today.     Advanced directives: Please bring a copy of your POA (Power of Attorney) and/or Living Will to your next appointment.   Conditions/risks identified: Declined setting any new goals.   Next appointment: None. Pt declined scheduling a CPE for 78 years old or an AWV for 2021.   Preventive Care 76 Years and Older, Male Preventive care refers to lifestyle choices and visits with your health care provider that can promote health and wellness. What does preventive care include?  A yearly physical exam. This is also called an annual well check.  Dental exams once or twice a year.  Routine eye exams. Ask your health care provider how often you should have your eyes checked.  Personal lifestyle choices, including:  Daily care of your teeth and gums.  Regular physical activity.  Eating a healthy diet.  Avoiding tobacco and drug use.  Limiting alcohol use.  Practicing safe sex.  Taking low doses of aspirin every day.  Taking vitamin and mineral supplements as recommended by your health care provider. What happens during an annual well check? The services and screenings done by your health care provider during your annual well check will depend on your age, overall health, lifestyle risk factors, and family history of disease. Counseling  Your health  care provider may ask you questions about your:  Alcohol use.  Tobacco use.  Drug use.  Emotional well-being.  Home and relationship well-being.  Sexual activity.  Eating habits.  History of falls.  Memory and ability to understand (cognition).  Work and work Astronomer. Screening  You may have the following tests or measurements:  Height, weight, and BMI.  Blood pressure.  Lipid and cholesterol levels. These may be checked every 5 years, or more frequently if you are over 15 years old.  Skin check.  Lung cancer screening. You may have this screening every year starting at age 37 if you have a 30-pack-year history of smoking and currently smoke or have quit within the past 15 years.  Fecal occult blood test (FOBT) of the stool. You may have this test every year starting at age 11.  Flexible sigmoidoscopy or colonoscopy. You may have a sigmoidoscopy every 5 years or a colonoscopy every 10 years starting at age 39.  Prostate cancer screening. Recommendations will vary depending on your family history and other risks.  Hepatitis C blood test.  Hepatitis B blood test.  Sexually transmitted disease (STD) testing.  Diabetes screening. This is done by checking your blood sugar (glucose) after you have not eaten for a while (fasting). You may have this done every 1-3 years.  Abdominal aortic aneurysm (AAA) screening. You may need this if you are a current or former smoker.  Osteoporosis. You may be screened starting at age 72 if you are at high risk. Talk with your health care provider about your test results, treatment options, and if necessary, the need for more tests. Vaccines  Your health  care provider may recommend certain vaccines, such as:  Influenza vaccine. This is recommended every year.  Tetanus, diphtheria, and acellular pertussis (Tdap, Td) vaccine. You may need a Td booster every 10 years.  Zoster vaccine. You may need this after age  76.  Pneumococcal 13-valent conjugate (PCV13) vaccine. One dose is recommended after age 1.  Pneumococcal polysaccharide (PPSV23) vaccine. One dose is recommended after age 45. Talk to your health care provider about which screenings and vaccines you need and how often you need them. This information is not intended to replace advice given to you by your health care provider. Make sure you discuss any questions you have with your health care provider. Document Released: 04/07/2015 Document Revised: 11/29/2015 Document Reviewed: 01/10/2015 Elsevier Interactive Patient Education  2017 [place] Prevention in the Home Falls can cause injuries. They can happen to people of all ages. There are many things you can do to make your home safe and to help prevent falls. What can I do on the outside of my home?  Regularly fix the edges of walkways and driveways and fix any cracks.  Remove anything that might make you trip as you walk through a door, such as a raised step or threshold.  Trim any bushes or trees on the path to your home.  Use bright outdoor lighting.  Clear any walking paths of anything that might make someone trip, such as rocks or tools.  Regularly check to see if handrails are loose or broken. Make sure that both sides of any steps have handrails.  Any raised decks and porches should have guardrails on the edges.  Have any leaves, snow, or ice cleared regularly.  Use sand or salt on walking paths during winter.  Clean up any spills in your garage right away. This includes oil or grease spills. What can I do in the bathroom?  Use night lights.  Install grab bars by the toilet and in the tub and shower. Do not use towel bars as grab bars.  Use non-skid mats or decals in the tub or shower.  If you need to sit down in the shower, use a plastic, non-slip stool.  Keep the floor dry. Clean up any water that spills on the floor as soon as it happens.  Remove  soap buildup in the tub or shower regularly.  Attach bath mats securely with double-sided non-slip rug tape.  Do not have throw rugs and other things on the floor that can make you trip. What can I do in the bedroom?  Use night lights.  Make sure that you have a light by your bed that is easy to reach.  Do not use any sheets or blankets that are too big for your bed. They should not hang down onto the floor.  Have a firm chair that has side arms. You can use this for support while you get dressed.  Do not have throw rugs and other things on the floor that can make you trip. What can I do in the kitchen?  Clean up any spills right away.  Avoid walking on wet floors.  Keep items that you use a lot in easy-to-reach places.  If you need to reach something above you, use a strong step stool that has a grab bar.  Keep electrical cords out of the way.  Do not use floor polish or wax that makes floors slippery. If you must use wax, use non-skid floor wax.  Do not have  throw rugs and other things on the floor that can make you trip. What can I do with my stairs?  Do not leave any items on the stairs.  Make sure that there are handrails on both sides of the stairs and use them. Fix handrails that are broken or loose. Make sure that handrails are as long as the stairways.  Check any carpeting to make sure that it is firmly attached to the stairs. Fix any carpet that is loose or worn.  Avoid having throw rugs at the top or bottom of the stairs. If you do have throw rugs, attach them to the floor with carpet tape.  Make sure that you have a light switch at the top of the stairs and the bottom of the stairs. If you do not have them, ask someone to add them for you. What else can I do to help prevent falls?  Wear shoes that:  Do not have high heels.  Have rubber bottoms.  Are comfortable and fit you well.  Are closed at the toe. Do not wear sandals.  If you use a  stepladder:  Make sure that it is fully opened. Do not climb a closed stepladder.  Make sure that both sides of the stepladder are locked into place.  Ask someone to hold it for you, if possible.  Clearly mark and make sure that you can see:  Any grab bars or handrails.  First and last steps.  Where the edge of each step is.  Use tools that help you move around (mobility aids) if they are needed. These include:  Canes.  Walkers.  Scooters.  Crutches.  Turn on the lights when you go into a dark area. Replace any light bulbs as soon as they burn out.  Set up your furniture so you have a clear path. Avoid moving your furniture around.  If any of your floors are uneven, fix them.  If there are any pets around you, be aware of where they are.  Review your medicines with your doctor. Some medicines can make you feel dizzy. This can increase your chance of falling. Ask your doctor what other things that you can do to help prevent falls. This information is not intended to replace advice given to you by your health care provider. Make sure you discuss any questions you have with your health care provider. Document Released: 01/05/2009 Document Revised: 08/17/2015 Document Reviewed: 04/15/2014 Elsevier Interactive Patient Education  2017 [employer].

## 2018-04-28 NOTE — Progress Notes (Addendum)
Subjective:   Roger Gutierrez is a 78 y.o. male who presents for Medicare Annual/Subsequent preventive examination.  Review of Systems:  N/A  Cardiac Risk Factors include: advanced age (>7555men, 78>65 women);dyslipidemia;hypertension;male gender;smoking/ tobacco exposure     Objective:    Vitals: BP 112/66 (BP Location: Right Arm)   Pulse 77   Temp 99 F (37.2 C) (Oral)   Ht 5\' 5"  (1.651 m)   Wt 106 lb (48.1 kg)   BMI 17.64 kg/m   Body mass index is 17.64 kg/m.  Advanced Directives 04/28/2018 04/17/2017 03/27/2016 05/09/2015 01/13/2015 12/16/2014  Does Patient Have a Medical Advance Directive? Yes Yes Yes Yes Yes Yes  Type of Estate agentAdvance Directive Healthcare Power of [place];Living will Healthcare Power of [place];Living will Living will;Healthcare Power of Attorney Living will;Healthcare Power of Attorney Living will;Healthcare Power of Attorney Living will;Healthcare Power of Attorney  Copy of Healthcare Power of Attorney in Chart? No - copy requested No - copy requested No - copy requested - - -    Tobacco Social History   Tobacco Use  Smoking Status Current Some Day Smoker  . Types: Cigars  . Last attempt to quit: 03/25/2003  . Years since quitting: 15.1  Smokeless Tobacco Never Used  Tobacco Comment   QUIT CIGARETTES 2004 only smoked 4-5 years.      Ready to quit: Not Answered Counseling given: Not Answered Comment: QUIT CIGARETTES 2004 only smoked 4-5 years.    Clinical Intake:     Pain : No/denies pain Pain Score: 0-No pain     Nutritional Status: BMI <19  Underweight Nutritional Risks: None Diabetes: No  How often do you need to have someone help you when you read instructions, pamphlets, or other written materials from your doctor or pharmacy?: 1 - Never  Interpreter Needed?: No  Information entered by :: [facility]markoski, LPN  Past Medical History:  Diagnosis Date  . Allergy   . Cataract    removed  . COPD (chronic obstructive pulmonary disease) (HCC)     . Hypertension   . Myocardial infarction (HCC)   . Osteoporosis    Past Surgical History:  Procedure Laterality Date  . ANGIOPLASTY     cardiac; 3 stents  . ankel fusion Left 1958  . EYE SURGERY Bilateral 08/2011 and 09/2011   cataract extraction  . TONSILLECTOMY AND ADENOIDECTOMY  1950's   Family History  Problem Relation Age of Onset  . Hypertension Mother   . Healthy Sister    Social History   Socioeconomic History  . Marital status: Married    Spouse name: Not on file  . Number of children: 2  . Years of education: Not on file  . Highest education level: Some college, no degree  Occupational History  . Occupation: retired  Engineer, productionocial Needs  . Financial resource strain: Not hard at all  . Food insecurity:    Worry: Never true    Inability: Never true  . Transportation needs:    Medical: No    Non-medical: No  Tobacco Use  . Smoking status: Current Some Day Smoker    Types: Cigars    Last attempt to quit: 03/25/2003    Years since quitting: 15.1  . Smokeless tobacco: Never Used  . Tobacco comment: QUIT CIGARETTES 2004 only smoked 4-5 years.   Substance and Sexual Activity  . Alcohol use: Yes    Comment: occasionally beer 1-2/monthly  . Drug use: No  . Sexual activity: Not on file    Comment:  1 cigar daily  Lifestyle  . Physical activity:    Days per week: 0 days    Minutes per session: 0 min  . Stress: Not at all  Relationships  . Social connections:    Talks on phone: Patient refused    Gets together: Patient refused    Attends religious service: Patient refused    Active member of club or organization: Patient refused    Attends meetings of clubs or organizations: Patient refused    Relationship status: Patient refused  Other Topics Concern  . Not on file  Social History Narrative  . Not on file    Outpatient Encounter Medications as of 04/28/2018  Medication Sig  . albuterol (PROVENTIL HFA;VENTOLIN HFA) 108 (90 Base) MCG/ACT inhaler INHALE 2 PUFFS  INTO THE LUNGS EVERY 6 HOURS AS NEEDED FOR WHEEZING OR SHORTNESS OF BREATH  . aspirin 81 MG tablet Take 1 tablet by mouth daily.  . hydrOXYzine (ATARAX/VISTARIL) 10 MG tablet Take 1-2 tablets (10-20 mg total) by mouth every 6 (six) hours as needed for itching.  Marland Kitchen ibuprofen (ADVIL,MOTRIN) 200 MG tablet Take 400 mg by mouth as needed.  . pravastatin (PRAVACHOL) 80 MG tablet Take 80 mg by mouth daily.  . ramipril (ALTACE) 5 MG capsule Take 1 capsule by mouth daily.  . Vitamin D, Ergocalciferol, (DRISDOL) 50000 units CAPS capsule TAKE 1 CAPSULE BY MOUTH EVERY 7 DAYS  . ibandronate (BONIVA) 150 MG tablet TAKE 1 TABLET(150 MG) BY MOUTH EVERY 30 DAYS (Patient not taking: Reported on 10/21/2017)   No facility-administered encounter medications on file as of 04/28/2018.     Activities of Daily Living In your present state of health, do you have any difficulty performing the following activities: 04/28/2018  Hearing? N  Vision? N  Comment Wears readers as needed.   Difficulty concentrating or making decisions? N  Walking or climbing stairs? N  Dressing or bathing? N  Doing errands, shopping? N  Preparing Food and eating ? N  Using the Toilet? N  In the past six months, have you accidently leaked urine? N  Do you have problems with loss of bowel control? N  Managing your Medications? N  Managing your Finances? N  Housekeeping or managing your Housekeeping? N  Some recent data might be hidden    Patient Care Team: Malva Limes, MD as PCP - General (Family Medicine) Lamar Blinks, MD as Consulting Physician (Cardiology)   Assessment:   [name].  Exercise Activities and Dietary recommendations Current Exercise Habits: The patient does not participate in regular exercise at present, Exercise limited by: None identified  Goals    . DIET - INCREASE WATER INTAKE     Recommend increasing water intake to 4 glasses a day. Try substituting a cup of  coffee for a glass of water.     . Increase water intake     Starting 03/27/16, I will increase my water intake to 4 glasses a day.       Fall Risk Fall Risk  04/28/2018 04/17/2017 03/27/2016 07/19/2015  Falls in the past year? 0 No No No   FALL RISK PREVENTION PERTAINING TO THE HOME:  Any stairs in or around the home WITH handrails? Yes  Home free of loose throw rugs in walkways, pet beds, electrical cords, etc? Yes  Adequate lighting in your home to reduce risk of falls? Yes   ASSISTIVE DEVICES UTILIZED TO PREVENT FALLS:  Life alert? No  Use of a cane, walker or w/c? No  Grab bars in the bathroom? No  Shower chair or bench in shower? No  Elevated toilet seat or a handicapped toilet? No    TIMED UP AND GO:  Was the test performed? No .     Depression Screen PHQ 2/9 Scores 04/28/2018 04/28/2018 04/17/2017 04/17/2017  PHQ - 2 Score 0 0 0 0  PHQ- 9 Score 0 - 0 -    Cognitive Function: Declined today.      6CIT Screen 03/27/2016  What Year? 0 points  What month? 0 points  What time? 0 points  Count back from 20 0 points  Months in reverse 0 points  Repeat phrase 6 points  Total Score 6    Immunization History  Administered Date(s) Administered  . Influenza, High Dose Seasonal PF 01/13/2015, 01/29/2018  . Influenza-Unspecified 02/03/2017  . Pneumococcal Conjugate-13 04/29/2014  . Pneumococcal Polysaccharide-23 11/19/2007  . Zoster 03/03/2012  . Zoster Recombinat (Shingrix) 11/03/2017    Qualifies for Shingles Vaccine? Yes , pt awaiting second Shingrix vaccine.   Tdap: Although this vaccine is not a covered service during a Wellness Exam, does the patient still wish to receive this vaccine today?  No .  Education has been provided regarding the importance of this vaccine. Advised may receive this vaccine at local pharmacy or Health Dept. Aware to provide a copy of the vaccination record if obtained from local pharmacy or Health Dept. Verbalized acceptance and  understanding.  Flu Vaccine: Up to date  Pneumococcal Vaccine: Up to date   Screening Tests Health Maintenance  Topic Date Due  . TETANUS/TDAP  05/10/1961  . COLONOSCOPY  02/01/2018  . DEXA SCAN  05/14/2020  . INFLUENZA VACCINE  Completed  . PNA vac Low Risk Adult  Completed   Cancer Screenings:  Colorectal Screening: Completed 02/02/08. Repeat every 10 years.P t declined colonoscopy referral and cologuard order.   Lung Cancer Screening: (Low Dose CT Chest recommended if Age 36-80 years, 30 pack-year currently smoking OR have quit w/in 15years.) does not qualify.    Additional Screening:  Vision Screening: Recommended annual ophthalmology exams for early detection of glaucoma and other disorders of the eye.  Dental Screening: Recommended annual dental exams for proper oral hygiene  Community Resource Referral:  CRR required this visit?  No        Plan:  I have personally reviewed and addressed the Medicare Annual Wellness questionnaire and have noted the following in the patient's chart:  A. Medical and social history B. Use of alcohol, tobacco or illicit drugs  C. Current medications and supplements D. Functional ability and status E.  Nutritional status F.  Physical activity G. Advance directives H. List of other physicians I.  Hospitalizations, surgeries, and ER visits in previous 12 months J.  Vitals K. Screenings such as hearing and vision if needed, cognitive and depression L. Referrals and appointments - none  In addition, I have reviewed and discussed with patient certain preventive protocols, quality metrics, and best practice recommendations. A written personalized care plan for preventive services as well as general preventive health recommendations were provided to patient.  See attached scanned questionnaire for additional information.   Signed,  Hyacinth MeekerMckenzie Carles Florea, LPN Nurse Health Advisor   Nurse Recommendations: Pt declined the tetanus  vaccine, colonoscopy referral and cologuard order today. Pt wishes to speak with Dr Sherrie MustacheFisher further about the cologuard.

## 2018-04-29 ENCOUNTER — Ambulatory Visit: Payer: Medicare Other | Admitting: Family Medicine

## 2018-04-29 ENCOUNTER — Encounter: Payer: Self-pay | Admitting: Family Medicine

## 2018-04-29 VITALS — BP 117/72 | HR 91 | Temp 98.1°F | Wt 106.0 lb

## 2018-04-29 DIAGNOSIS — H6122 Impacted cerumen, left ear: Secondary | ICD-10-CM | POA: Diagnosis not present

## 2018-04-29 NOTE — Patient Instructions (Signed)
.   Please review the attached list of medications and notify my office if there are any errors.   . Please bring all of your medications to every appointment so we can make sure that our medication list is the same as yours.   

## 2018-04-29 NOTE — Progress Notes (Signed)
Patient: Roger Gutierrez Male    DOB: 1940-04-25   78 y.o.   MRN: [id number] Visit Date: 04/29/2018  Today's Provider: [name], MD   Chief Complaint  Patient presents with  . Ear Pain   Subjective:     Otalgia   There is pain in the left ear. This is a new problem. Episode onset: 3 days ago. Pertinent negatives include no abdominal pain, coughing, diarrhea, ear discharge, headaches, hearing loss, neck pain, rash, rhinorrhea, sore throat or vomiting.    Allergies  Allergen Reactions  . Megace  [Megestrol] Shortness Of Breath and Palpitations     Current Outpatient Medications:  .  albuterol (PROVENTIL HFA;VENTOLIN HFA) 108 (90 Base) MCG/ACT inhaler, INHALE 2 PUFFS INTO THE LUNGS EVERY 6 HOURS AS NEEDED FOR WHEEZING OR SHORTNESS OF BREATH, Disp: 8.5 g, Rfl: 3 .  aspirin 81 MG tablet, Take 1 tablet by mouth daily., Disp: , Rfl:  .  hydrOXYzine (ATARAX/VISTARIL) 10 MG tablet, Take 1-2 tablets (10-20 mg total) by mouth every 6 (six) hours as needed for itching., Disp: 30 tablet, Rfl: 0 .  ibandronate (BONIVA) 150 MG tablet, TAKE 1 TABLET(150 MG) BY MOUTH EVERY 30 DAYS, Disp: 3 tablet, Rfl: 4 .  ibuprofen (ADVIL,MOTRIN) 200 MG tablet, Take 400 mg by mouth as needed., Disp: , Rfl:  .  pravastatin (PRAVACHOL) 80 MG tablet, Take 80 mg by mouth daily., Disp: , Rfl:  .  ramipril (ALTACE) 5 MG capsule, Take 1 capsule by mouth daily., Disp: , Rfl:  .  Vitamin D, Ergocalciferol, (DRISDOL) 50000 units CAPS capsule, TAKE 1 CAPSULE BY MOUTH EVERY 7 DAYS, Disp: 12 capsule, Rfl: 4  Review of Systems  Constitutional: Negative.   HENT: Positive for ear pain. Negative for congestion, ear discharge, hearing loss, rhinorrhea, sinus pressure, sinus pain and sore throat.   Respiratory: Negative.  Negative for cough.   Gastrointestinal: Negative.  Negative for abdominal pain, diarrhea and vomiting.  Musculoskeletal: Negative for neck pain.  Skin: Negative for rash.  Neurological: Negative  for dizziness, light-headedness and headaches.    Social History   Tobacco Use  . Smoking status: Current Some Day Smoker    Types: Cigars    Last attempt to quit: 03/25/2003    Years since quitting: 15.1  . Smokeless tobacco: Never Used  . Tobacco comment: QUIT CIGARETTES 2004 only smoked 4-5 years.   Substance Use Topics  . Alcohol use: Yes    Comment: occasionally beer 1-2/monthly      Objective:   BP 117/72 (BP Location: Right Arm, Patient Position: Sitting, Cuff Size: Normal)   Pulse 91   Temp 98.1 F (36.7 C) (Oral)   Wt 106 lb (48.1 kg)   BMI 17.64 kg/m  Vitals:   04/29/18 1042  BP: 117/72  Pulse: 91  Temp: 98.1 F (36.7 C)  TempSrc: Oral  Weight: 106 lb (48.1 kg)     Physical Exam  General Appearance:    Alert, cooperative, no distress  HENT:   right TM normal without fluid or infection. Left ear canal obstructed  Eyes:    PERRL, conjunctiva/corneas clear, EOM's intact       Lungs:     Clear to auscultation bilaterally, respirations unlabored  Heart:    Regular rate and rhythm  Neurologic:   Awake, alert, oriented x 3. No apparent focal neurological           defect.  Assessment & Plan    1. Impacted cerumen of left ear After soaking with Debrox, ear canal was irrigated with water until clear. Patient tolerated procedure well.      [name], MD  [facility]

## 2018-05-22 ENCOUNTER — Ambulatory Visit: Payer: Medicare Other | Admitting: Family Medicine

## 2018-05-22 ENCOUNTER — Encounter: Payer: Self-pay | Admitting: Family Medicine

## 2018-05-22 VITALS — BP 108/62 | HR 84 | Temp 97.9°F | Resp 16 | Ht 64.0 in | Wt 104.0 lb

## 2018-05-22 DIAGNOSIS — H66002 Acute suppurative otitis media without spontaneous rupture of ear drum, left ear: Secondary | ICD-10-CM

## 2018-05-22 MED ORDER — AMOXICILLIN 500 MG PO CAPS: 1000.0000 mg | ORAL_CAPSULE | Freq: Two times a day (BID) | ORAL | 0 refills | Status: AC

## 2018-05-22 NOTE — Patient Instructions (Signed)
.   Please review the attached list of medications and notify my office if there are any errors.   . Please bring all of your medications to every appointment so we can make sure that our medication list is the same as yours.   

## 2018-05-22 NOTE — Progress Notes (Signed)
Patient: ABDURREHMAN Gutierrez Male    DOB: 02-26-1941   78 y.o.   MRN: [id number] Visit Date: 05/22/2018  Today's Provider: [name], MD   Chief Complaint  Patient presents with  . left ear fullness   Subjective:     HPI  Patient comes in today c/o left ear fullness X 3-4 days. He has used OTC sinus and congestion tablets to see if symptoms would resolve, and patient reports that it did not help.   Allergies  Allergen Reactions  . Megace  [Megestrol] Shortness Of Breath and Palpitations     Current Outpatient Medications:  .  albuterol (PROVENTIL HFA;VENTOLIN HFA) 108 (90 Base) MCG/ACT inhaler, INHALE 2 PUFFS INTO THE LUNGS EVERY 6 HOURS AS NEEDED FOR WHEEZING OR SHORTNESS OF BREATH, Disp: 8.5 g, Rfl: 3 .  aspirin 81 MG tablet, Take 1 tablet by mouth daily., Disp: , Rfl:  .  hydrOXYzine (ATARAX/VISTARIL) 10 MG tablet, Take 1-2 tablets (10-20 mg total) by mouth every 6 (six) hours as needed for itching., Disp: 30 tablet, Rfl: 0 .  ibandronate (BONIVA) 150 MG tablet, TAKE 1 TABLET(150 MG) BY MOUTH EVERY 30 DAYS, Disp: 3 tablet, Rfl: 4 .  ibuprofen (ADVIL,MOTRIN) 200 MG tablet, Take 400 mg by mouth as needed., Disp: , Rfl:  .  pravastatin (PRAVACHOL) 80 MG tablet, Take 80 mg by mouth daily., Disp: , Rfl:  .  ramipril (ALTACE) 5 MG capsule, Take 1 capsule by mouth daily., Disp: , Rfl:  .  Vitamin D, Ergocalciferol, (DRISDOL) 50000 units CAPS capsule, TAKE 1 CAPSULE BY MOUTH EVERY 7 DAYS, Disp: 12 capsule, Rfl: 4  Review of Systems  Constitutional: Negative for activity change, appetite change, chills, diaphoresis and fever.  HENT: Positive for hearing loss. Negative for congestion, ear discharge, ear pain and postnasal drip.   Respiratory: Negative for cough.     Social History   Tobacco Use  . Smoking status: Current Some Day Smoker    Types: Cigars    Last attempt to quit: 03/25/2003    Years since quitting: 15.1  . Smokeless tobacco: Never Used  . Tobacco comment:  QUIT CIGARETTES 2004 only smoked 4-5 years.   Substance Use Topics  . Alcohol use: Yes    Comment: occasionally beer 1-2/monthly      Objective:   BP 108/62 (BP Location: Right Arm, Patient Position: Sitting, Cuff Size: Normal)   Pulse 84   Temp 97.9 F (36.6 C)   Resp 16   Ht 5\' 4"  (1.626 m)   Wt 104 lb (47.2 kg)   BMI 17.85 kg/m  Vitals:   05/22/18 1538  BP: 108/62  Pulse: 84  Resp: 16  Temp: 97.9 F (36.6 C)  Weight: 104 lb (47.2 kg)  Height: 5\' 4"  (1.626 m)     Physical Exam  General Appearance:    Alert, cooperative, no distress  HENT:   right TM normal without fluid or infection, left TM red, dull, bulging, neck without nodes, throat normal without erythema or exudate and sinuses nontender  Eyes:    PERRL, conjunctiva/corneas clear, EOM's intact       Lungs:     Clear to auscultation bilaterally, respirations unlabored  Heart:    Regular rate and rhythm  Neurologic:   Awake, alert, oriented x 3. No apparent focal neurological           defect.           Assessment & Plan  1. Non-recurrent acute suppurative otitis media of left ear without spontaneous rupture of tympanic membrane  - amoxicillin (AMOXIL) 500 MG capsule; Take 2 capsules (1,000 mg total) by mouth 2 (two) times daily for 10 days.  Dispense: 40 capsule; Refill: 0  Call if symptoms change or if not rapidly improving.        [name], MD  [facility]

## 2018-07-30 ENCOUNTER — Other Ambulatory Visit: Payer: Self-pay | Admitting: Family Medicine

## 2018-07-30 DIAGNOSIS — M810 Age-related osteoporosis without current pathological fracture: Secondary | ICD-10-CM

## 2018-08-01 ENCOUNTER — Other Ambulatory Visit: Payer: Self-pay | Admitting: Family Medicine

## 2018-08-03 ENCOUNTER — Ambulatory Visit (INDEPENDENT_AMBULATORY_CARE_PROVIDER_SITE_OTHER): Payer: Medicare Other | Admitting: Physician Assistant

## 2018-08-03 ENCOUNTER — Encounter: Payer: Self-pay | Admitting: Physician Assistant

## 2018-08-03 VITALS — BP 108/75 | HR 90 | Temp 98.1°F | Resp 16 | Wt 105.4 lb

## 2018-08-03 DIAGNOSIS — H60502 Unspecified acute noninfective otitis externa, left ear: Secondary | ICD-10-CM

## 2018-08-03 MED ORDER — NEOMYCIN-POLYMYXIN-HC 3.5-10000-1 OT SOLN: 3.0000 [drp] | Freq: Four times a day (QID) | OTIC | 0 refills | Status: AC

## 2018-08-03 NOTE — Progress Notes (Signed)
Patient: Roger Gutierrez Male    DOB: [birth year]   78 y.o.   MRN: [id number] Visit Date: 08/03/2018  Today's Provider: Trey SailorsAdriana M Basilia Stuckert, PA-C   Chief Complaint  Patient presents with  . Otalgia   Subjective:    I,Joseline E. Rosas,RMA am acting as a Neurosurgeonscribe for [employer] Groupdriana Kysen Wetherington, PA-C.  Otalgia   There is pain in the left ear. The current episode started 1 to 4 weeks ago. The problem has been unchanged. There has been no fever. The pain is at a severity of 5/10. The pain is moderate. Pertinent negatives include no coughing, ear discharge, headaches, neck pain, rhinorrhea or sore throat. He has tried ear drops for the symptoms. The treatment provided no relief.      Allergies  Allergen Reactions  . Megace  [Megestrol] Shortness Of Breath and Palpitations     Current Outpatient Medications:  .  albuterol (PROVENTIL HFA;VENTOLIN HFA) 108 (90 Base) MCG/ACT inhaler, INHALE 2 PUFFS INTO THE LUNGS EVERY 6 HOURS AS NEEDED FOR WHEEZING OR SHORTNESS OF BREATH, Disp: 8.5 g, Rfl: 3 .  aspirin 81 MG tablet, Take 1 tablet by mouth daily., Disp: , Rfl:  .  hydrOXYzine (ATARAX/VISTARIL) 10 MG tablet, Take 1-2 tablets (10-20 mg total) by mouth every 6 (six) hours as needed for itching., Disp: 30 tablet, Rfl: 0 .  ibandronate (BONIVA) 150 MG tablet, TAKE 1 TABLET(150 MG) BY MOUTH EVERY 30 DAYS, Disp: 3 tablet, Rfl: 4 .  ibuprofen (ADVIL,MOTRIN) 200 MG tablet, Take 400 mg by mouth as needed., Disp: , Rfl:  .  neomycin-polymyxin-hydrocortisone (CORTISPORIN) OTIC solution, Place 3 drops into the left ear 4 (four) times daily for 7 days., Disp: 4.2 mL, Rfl: 0 .  pravastatin (PRAVACHOL) 80 MG tablet, Take 80 mg by mouth daily., Disp: , Rfl:  .  ramipril (ALTACE) 5 MG capsule, Take 1 capsule by mouth daily., Disp: , Rfl:  .  Vitamin D, Ergocalciferol, (DRISDOL) 1.25 MG (50000 UT) CAPS capsule, TAKE 1 CAPSULE BY MOUTH EVERY 7 DAYS, Disp: 12 capsule, Rfl: 4  Review of Systems  HENT: Positive for ear  pain. Negative for ear discharge, rhinorrhea and sore throat.   Respiratory: Negative for cough.   Musculoskeletal: Negative for neck pain.  Neurological: Negative for headaches.    Social History   Tobacco Use  . Smoking status: Current Some Day Smoker    Types: Cigars    Last attempt to quit: 03/25/2003    Years since quitting: 15.3  . Smokeless tobacco: Never Used  . Tobacco comment: QUIT CIGARETTES 2004 only smoked 4-5 years.   Substance Use Topics  . Alcohol use: Yes    Comment: occasionally beer 1-2/monthly      Objective:   BP 108/75 (BP Location: Left Arm, Patient Position: Sitting, Cuff Size: Normal)   Pulse 90   Temp 98.1 F (36.7 C) (Oral)   Resp 16   Wt 105 lb 6.4 oz (47.8 kg)   BMI 18.09 kg/m  Vitals:   08/03/18 1423  BP: 108/75  Pulse: 90  Resp: 16  Temp: 98.1 F (36.7 C)  TempSrc: Oral  Weight: 105 lb 6.4 oz (47.8 kg)     Physical Exam Constitutional:      Appearance: Normal appearance.  HENT:     Right Ear: Tympanic membrane and ear canal normal.     Left Ear: Tympanic membrane normal. Swelling present.  Lymphadenopathy:     Cervical: No cervical adenopathy.  Skin:  General: Skin is warm and dry.  Neurological:     Mental Status: He is alert and oriented to person, place, and time. Mental status is at baseline.  Psychiatric:        Mood and Affect: Mood normal.        Behavior: Behavior normal.         Assessment & Plan    1. Acute otitis externa of left ear, unspecified type  Cortisporin 3 drops four times daily in left ear for one week.   The entirety of the information documented in the History of Present Illness, Review of Systems and Physical Exam were personally obtained by me. Portions of this information were initially documented by Hetty Ely, CMA and reviewed by me for thoroughness and accuracy.   F/u PRN.          Trey Sailors, PA-C  [facility]

## 2018-08-13 NOTE — Patient Instructions (Signed)

## 2018-09-28 ENCOUNTER — Encounter: Payer: Self-pay | Admitting: Family Medicine

## 2018-09-28 ENCOUNTER — Other Ambulatory Visit: Payer: Self-pay

## 2018-09-28 ENCOUNTER — Ambulatory Visit: Payer: Medicare Other | Admitting: Family Medicine

## 2018-09-28 VITALS — BP 101/68 | HR 106 | Temp 98.1°F | Resp 20 | Wt 104.0 lb

## 2018-09-28 DIAGNOSIS — H6690 Otitis media, unspecified, unspecified ear: Secondary | ICD-10-CM | POA: Diagnosis not present

## 2018-09-28 MED ORDER — AMOXICILLIN 500 MG PO CAPS: 1000.0000 mg | ORAL_CAPSULE | Freq: Two times a day (BID) | ORAL | 0 refills | Status: DC

## 2018-09-28 NOTE — Progress Notes (Signed)
Patient: Roger Gutierrez Male    DOB: 1940-05-18   78 y.o.   MRN: [id number] Visit Date: 09/28/2018  Today's Provider: [name], MD   Chief Complaint  Patient presents with  . Sinusitis    Started about 10 days ago.   Subjective:     Sinusitis This is a new problem. The current episode started 1 to 4 weeks ago (Started about 10 days ago.). The problem has been gradually worsening since onset. There has been no fever. Associated symptoms include congestion, ear pain, headaches and sinus pressure. Pertinent negatives include no chills, coughing, diaphoresis, hoarse voice, neck pain, shortness of breath, sneezing, sore throat or swollen glands. Past treatments include spray decongestants and oral decongestants. The treatment provided no relief.    Allergies  Allergen Reactions  . Megace  [Megestrol] Shortness Of Breath and Palpitations     Current Outpatient Medications:  .  albuterol (PROVENTIL HFA;VENTOLIN HFA) 108 (90 Base) MCG/ACT inhaler, INHALE 2 PUFFS INTO THE LUNGS EVERY 6 HOURS AS NEEDED FOR WHEEZING OR SHORTNESS OF BREATH, Disp: 8.5 g, Rfl: 3 .  aspirin 81 MG tablet, Take 1 tablet by mouth daily., Disp: , Rfl:  .  hydrOXYzine (ATARAX/VISTARIL) 10 MG tablet, Take 1-2 tablets (10-20 mg total) by mouth every 6 (six) hours as needed for itching., Disp: 30 tablet, Rfl: 0 .  ibandronate (BONIVA) 150 MG tablet, TAKE 1 TABLET(150 MG) BY MOUTH EVERY 30 DAYS, Disp: 3 tablet, Rfl: 4 .  ibuprofen (ADVIL,MOTRIN) 200 MG tablet, Take 400 mg by mouth as needed., Disp: , Rfl:  .  pravastatin (PRAVACHOL) 80 MG tablet, Take 80 mg by mouth daily., Disp: , Rfl:  .  ramipril (ALTACE) 5 MG capsule, Take 1 capsule by mouth daily., Disp: , Rfl:  .  Vitamin D, Ergocalciferol, (DRISDOL) 1.25 MG (50000 UT) CAPS capsule, TAKE 1 CAPSULE BY MOUTH EVERY 7 DAYS, Disp: 12 capsule, Rfl: 4  Review of Systems  Constitutional: Negative for activity change, appetite change, chills, diaphoresis,  fatigue, fever and unexpected weight change.  HENT: Positive for congestion, ear pain, sinus pressure and sinus pain. Negative for ear discharge, hoarse voice, postnasal drip, rhinorrhea, sneezing, sore throat, tinnitus, trouble swallowing and voice change.   Eyes: Negative.   Respiratory: Negative.  Negative for cough and shortness of breath.   Gastrointestinal: Negative.   Musculoskeletal: Negative for neck pain.  Neurological: Positive for headaches. Negative for dizziness and light-headedness.    Social History   Tobacco Use  . Smoking status: Current Some Day Smoker    Types: Cigars    Last attempt to quit: 03/25/2003    Years since quitting: 15.5  . Smokeless tobacco: Never Used  . Tobacco comment: QUIT CIGARETTES 2004 only smoked 4-5 years.   Substance Use Topics  . Alcohol use: Yes    Comment: occasionally beer 1-2/monthly      Objective:   BP 101/68 (BP Location: Right Arm, Patient Position: Sitting, Cuff Size: Normal)   Pulse (!) 106   Temp 98.1 F (36.7 C) (Oral)   Resp 20   Wt 104 lb (47.2 kg)   BMI 17.85 kg/m  Vitals:   09/28/18 1548  BP: 101/68  Pulse: (!) 106  Resp: 20  Temp: 98.1 F (36.7 C)  TempSrc: Oral  Weight: 104 lb (47.2 kg)     Physical Exam  General Appearance:    Alert, cooperative, no distress  HENT:   right TM normal without fluid or infection  and left TM red, dull, bulging  Eyes:    PERRL, conjunctiva/corneas clear, EOM's intact       Lungs:     Clear to auscultation bilaterally, respirations unlabored  Heart:    Regular rate and rhythm  Neurologic:   Awake, alert, oriented x 3. No apparent focal neurological           defect.            Assessment & Plan    1. Acute otitis media, unspecified otitis media type  - amoxicillin (AMOXIL) 500 MG capsule; Take 2 capsules (1,000 mg total) by mouth 2 (two) times daily for 10 days.  Dispense: 40 capsule; Refill: 0     Lelon Huh, MD  [facility]

## 2018-09-29 NOTE — Patient Instructions (Signed)
.   Please review the attached list of medications and notify my office if there are any errors.   . Please bring all of your medications to every appointment so we can make sure that our medication list is the same as yours.   . We will have flu vaccines available after Labor Day. Please go to your pharmacy or call the office in early September to schedule you flu shot.   

## 2018-10-22 NOTE — Progress Notes (Signed)
Patient: Roger Gutierrez Male    DOB: Jul 11, 1940   78 y.o.   MRN: [id number] Visit Date: 10/22/2018  Today's Provider: Mila Merryonald Normon Pettijohn, MD   Chief Complaint  Patient presents with  . Nasal Congestion   Subjective:    Virtual Visit via Telephone Note  I connected with Roger Gutierrez on 10/23/18 at  8:40 AM EDT by telephone and verified that I am speaking with the correct person using two identifiers.  [facility]   I discussed the limitations, risks, security and privacy concerns of performing an evaluation and management service by telephone and the availability of in person appointments. I also discussed with the patient that there may be a patient responsible charge related to this service. The patient expressed understanding and agreed to proceed.   Nasal Congestion: Patient complains of nasal congestion for the past 3 weeks. He has tried using nasal spray, which he says only provided temporary relief. Patient was seen in the office 3 weeks ago for Otitis Media and was prescribed  Amoxicillin. He reports that the ear infection has resolved, but he is still having nasal congestion. No frontal or maxillary sinus pressure, does have occasional clear drainage. He has been using OTC nasal decongestant spray for about a month and using It every 3-4 hours.  Patient denies any fever, cough, shortness of breath, headache, chills, runny nose of sneezing.   Allergies  Allergen Reactions  . Megace  [Megestrol] Shortness Of Breath and Palpitations     Current Outpatient Medications:  .  albuterol (PROVENTIL HFA;VENTOLIN HFA) 108 (90 Base) MCG/ACT inhaler, INHALE 2 PUFFS INTO THE LUNGS EVERY 6 HOURS AS NEEDED FOR WHEEZING OR SHORTNESS OF BREATH, Disp: 8.5 g, Rfl: 3 .  aspirin 81 MG tablet, Take 1 tablet by mouth daily., Disp: , Rfl:  .  hydrOXYzine (ATARAX/VISTARIL) 10 MG tablet, Take 1-2 tablets (10-20 mg total) by mouth every 6 (six)  hours as needed for itching., Disp: 30 tablet, Rfl: 0 .  ibandronate (BONIVA) 150 MG tablet, TAKE 1 TABLET(150 MG) BY MOUTH EVERY 30 DAYS, Disp: 3 tablet, Rfl: 4 .  ibuprofen (ADVIL,MOTRIN) 200 MG tablet, Take 400 mg by mouth as needed., Disp: , Rfl:  .  pravastatin (PRAVACHOL) 80 MG tablet, Take 80 mg by mouth daily., Disp: , Rfl:  .  ramipril (ALTACE) 5 MG capsule, Take 1 capsule by mouth daily., Disp: , Rfl:  .  Vitamin D, Ergocalciferol, (DRISDOL) 1.25 MG (50000 UT) CAPS capsule, TAKE 1 CAPSULE BY MOUTH EVERY 7 DAYS, Disp: 12 capsule, Rfl: 4  Review of Systems  Constitutional: Negative for appetite change, chills and fever.  Respiratory: Negative for chest tightness, shortness of breath and wheezing.   Cardiovascular: Negative for chest pain and palpitations.  Gastrointestinal: Negative for abdominal pain, nausea and vomiting.    Social History   Tobacco Use  . Smoking status: Current Some Day Smoker    Types: Cigars    Last attempt to quit: 03/25/2003    Years since quitting: 15.5  . Smokeless tobacco: Never Used  . Tobacco comment: QUIT CIGARETTES 2004 only smoked 4-5 years.   Substance Use Topics  . Alcohol use: Yes    Comment: occasionally beer 1-2/monthly      Objective:   There were no vitals taken for this visit.    Physical Exam   By telephone is awake, alert, oriented, in no distress     Assessment & Plan  1. Nasal congestion Exacerbated by overuse of OTC nasal decongestant spray. He is to wean off of spray. Can try alternating nostrils daily and use as infrequently as he can stand. May have some underlying allergies, can start.  - fluticasone (FLONASE) 50 MCG/ACT nasal spray; Place 2 sprays into both nostrils daily.  Dispense: 16 g; Refill: 0   I discussed the assessment and treatment plan with the patient. The patient was provided an opportunity to ask questions and all were answered. The patient agreed with the plan and demonstrated an understanding of  the instructions.   The patient was advised to call back or seek an in-person evaluation if the symptoms worsen or if the condition fails to improve as anticipated.  I provided 10 minutes of non-face-to-face time during this encounter.     Lelon Huh, MD  [facility]

## 2018-10-23 ENCOUNTER — Ambulatory Visit (INDEPENDENT_AMBULATORY_CARE_PROVIDER_SITE_OTHER): Payer: Medicare Other | Admitting: Family Medicine

## 2018-10-23 ENCOUNTER — Other Ambulatory Visit: Payer: Self-pay | Admitting: Family Medicine

## 2018-10-23 ENCOUNTER — Other Ambulatory Visit: Payer: Self-pay

## 2018-10-23 DIAGNOSIS — R0981 Nasal congestion: Secondary | ICD-10-CM

## 2018-10-23 MED ORDER — FLUTICASONE PROPIONATE 50 MCG/ACT NA SUSP: 2.0000 | Freq: Every day | NASAL | 0 refills | Status: DC

## 2019-04-06 ENCOUNTER — Other Ambulatory Visit: Payer: Self-pay | Admitting: Family Medicine

## 2019-04-06 ENCOUNTER — Ambulatory Visit (INDEPENDENT_AMBULATORY_CARE_PROVIDER_SITE_OTHER): Payer: Medicare PPO | Admitting: Family Medicine

## 2019-04-06 ENCOUNTER — Encounter: Payer: Self-pay | Admitting: Family Medicine

## 2019-04-06 DIAGNOSIS — R0981 Nasal congestion: Secondary | ICD-10-CM

## 2019-04-06 DIAGNOSIS — H6690 Otitis media, unspecified, unspecified ear: Secondary | ICD-10-CM | POA: Diagnosis not present

## 2019-04-06 MED ORDER — FLUTICASONE PROPIONATE 50 MCG/ACT NA SUSP: 2.0000 | Freq: Every day | NASAL | 0 refills | Status: DC

## 2019-04-06 MED ORDER — AMOXICILLIN 500 MG PO CAPS: 1000.0000 mg | ORAL_CAPSULE | Freq: Two times a day (BID) | ORAL | 0 refills | Status: AC

## 2019-04-06 NOTE — Progress Notes (Signed)
Patient: Roger Gutierrez Male    DOB: 07/17/1940   79 y.o.   MRN: [id number] Visit Date: 04/06/2019  Today's Provider: Lelon Huh, MD   Chief Complaint  Patient presents with  . URI   Subjective:    Virtual Visit via Telephone Note  I connected with Roger Gutierrez on 04/06/19 at  4:00 PM EST by telephone and verified that I am speaking with the correct person using two identifiers.  Location: Patient: home Provider: [facility]   I discussed the limitations, risks, security and privacy concerns of performing an evaluation and management service by telephone and the availability of in person appointments. I also discussed with the patient that there may be a patient responsible charge related to this service. The patient expressed understanding and agreed to proceed.  URI  This is a new problem. Episode onset: 4 weeks ago. The problem has been gradually worsening. There has been no fever. Associated symptoms include congestion, coughing, rhinorrhea and sinus pain. Pertinent negatives include no ear pain, headaches, sneezing, sore throat or wheezing. Associated symptoms comments: SOB . Treatments tried: OTC decongestant, and nasal spray. The treatment provided mild relief.  States drainage has turned yellowish the last several days. No shortness of breath. Mild pain in sinuses, around nose, no fever  Allergies  Allergen Reactions  . Megace  [Megestrol] Shortness Of Breath and Palpitations     Current Outpatient Medications:  .  albuterol (PROVENTIL HFA;VENTOLIN HFA) 108 (90 Base) MCG/ACT inhaler, INHALE 2 PUFFS INTO THE LUNGS EVERY 6 HOURS AS NEEDED FOR WHEEZING OR SHORTNESS OF BREATH, Disp: 8.5 g, Rfl: 3 .  aspirin 81 MG tablet, Take 1 tablet by mouth daily., Disp: , Rfl:  .  hydrOXYzine (ATARAX/VISTARIL) 10 MG tablet, Take 1-2 tablets (10-20 mg total) by mouth every 6 (six) hours as needed for itching., Disp: 30 tablet, Rfl: 0 .  ibandronate (BONIVA) 150 MG tablet, TAKE 1  TABLET(150 MG) BY MOUTH EVERY 30 DAYS, Disp: 3 tablet, Rfl: 4 .  ibuprofen (ADVIL,MOTRIN) 200 MG tablet, Take 400 mg by mouth as needed., Disp: , Rfl:  .  pravastatin (PRAVACHOL) 80 MG tablet, Take 80 mg by mouth daily., Disp: , Rfl:  .  ramipril (ALTACE) 5 MG capsule, Take 1 capsule by mouth daily., Disp: , Rfl:  .  Vitamin D, Ergocalciferol, (DRISDOL) 1.25 MG (50000 UT) CAPS capsule, TAKE 1 CAPSULE BY MOUTH EVERY 7 DAYS, Disp: 12 capsule, Rfl: 4 .  fluticasone (FLONASE) 50 MCG/ACT nasal spray, Place 2 sprays into both nostrils daily., Disp: 16 g, Rfl: 0  Review of Systems  Constitutional: Negative.   HENT: Positive for congestion, rhinorrhea and sinus pain. Negative for ear pain, sneezing and sore throat.   Respiratory: Positive for cough and shortness of breath. Negative for wheezing.   Cardiovascular: Negative.   Musculoskeletal: Negative.   Neurological: Negative for headaches.    Social History   Tobacco Use  . Smoking status: Current Some Day Smoker    Types: Cigars    Last attempt to quit: 03/25/2003    Years since quitting: 16.0  . Smokeless tobacco: Never Used  . Tobacco comment: QUIT CIGARETTES 2004 only smoked 4-5 years.   Substance Use Topics  . Alcohol use: Yes    Comment: occasionally beer 1-2/monthly      Objective:   There were no vitals taken for this visit. There were no vitals filed for this visit.There is no height or weight on file to  calculate BMI.   Physical Exam  Awake, alert, oriented x 3. In no apparent distress  No results found for any visits on 04/06/19.     Assessment & Plan     1. Nasal congestion  - fluticasone (FLONASE) 50 MCG/ACT nasal spray; Place 2 sprays into both nostrils daily.  Dispense: 16 g; Refill: 0  2. Acute otitis media, unspecified otitis media type  - amoxicillin (AMOXIL) 500 MG capsule; Take 2 capsules (1,000 mg total) by mouth 2 (two) times daily for 10 days.  Dispense: 40 capsule; Refill: 0  Call if symptoms  change or if not rapidly improving.     The entirety of the information documented in the History of Present Illness, Review of Systems and Physical Exam were personally obtained by me. Portions of this information were initially documented by Martyn Ehrich, CMA and reviewed by me for thoroughness and accuracy.   I discussed the assessment and treatment plan with the patient. The patient was provided an opportunity to ask questions and all were answered. The patient agreed with the plan and demonstrated an understanding of the instructions.   The patient was advised to call back or seek an in-person evaluation if the symptoms worsen or if the condition fails to improve as anticipated.  I provided 12 minutes of non-face-to-face time during this encounter.     [name], MD  [facility]

## 2019-05-22 ENCOUNTER — Other Ambulatory Visit: Payer: Self-pay | Admitting: Family Medicine

## 2019-06-09 DIAGNOSIS — M4124 Other idiopathic scoliosis, thoracic region: Secondary | ICD-10-CM | POA: Diagnosis not present

## 2019-06-09 DIAGNOSIS — M9902 Segmental and somatic dysfunction of thoracic region: Secondary | ICD-10-CM | POA: Diagnosis not present

## 2019-06-09 DIAGNOSIS — M9903 Segmental and somatic dysfunction of lumbar region: Secondary | ICD-10-CM | POA: Diagnosis not present

## 2019-06-09 DIAGNOSIS — M5136 Other intervertebral disc degeneration, lumbar region: Secondary | ICD-10-CM | POA: Diagnosis not present

## 2019-06-21 ENCOUNTER — Telehealth: Payer: Self-pay | Admitting: Family Medicine

## 2019-06-21 NOTE — Telephone Encounter (Signed)
Spoke with Roger Gutierrez(pt), he stated he will call back to r/s AWV

## 2019-06-24 ENCOUNTER — Encounter: Payer: Self-pay | Admitting: Physician Assistant

## 2019-06-24 ENCOUNTER — Ambulatory Visit (INDEPENDENT_AMBULATORY_CARE_PROVIDER_SITE_OTHER): Payer: Medicare PPO | Admitting: Physician Assistant

## 2019-06-24 ENCOUNTER — Telehealth: Payer: Self-pay | Admitting: Family Medicine

## 2019-06-24 VITALS — Temp 96.7°F

## 2019-06-24 DIAGNOSIS — J301 Allergic rhinitis due to pollen: Secondary | ICD-10-CM | POA: Diagnosis not present

## 2019-06-24 NOTE — Telephone Encounter (Signed)
Pt has called and is wanting Dr Sherrie Mustache specific appt or for Dr. Sherrie Mustache to call him in antibiotic. He is complaining w/h cough,  yellow secretion and states he has a lung infection but no fever, that Dr F is aware of his situation. Using DT needs a virtual but since Dr Sherrie Mustache is booked he wants a call from his nurse to proceed further.  I have advised him that she would reach out to him at [phone]

## 2019-06-24 NOTE — Telephone Encounter (Signed)
Spoke with patient on the phone he complained of sinus pressure and drainage for the past two weeks and cough productive of thick yellow sputum, patient requested to be seen by Dr. Sherrie Mustache or have antibiotic called in since he states that provider is aware of his history. Advised patient that he needs to be assessed before any medication can be prescribed if needed. Patient does not have access to Internet and did not want to go to urgent care. I have scheduled a telephone visit at 11:20Am with Roger Gutierrez to address. KW

## 2019-06-24 NOTE — Progress Notes (Signed)
Virtual Visit via Telephone Note  I connected with Roger Gutierrez on 06/24/19 at 11:20 AM EDT by telephone and verified that I am speaking with the correct person using two identifiers.  Location: Patient: Home Provider: Office    I discussed the limitations, risks, security and privacy concerns of performing an evaluation and management service by telephone and the availability of in person appointments. I also discussed with the patient that there may be a patient responsible charge related to this service. The patient expressed understanding and agreed to proceed.    Patient: Roger Gutierrez Male    DOB: [birth year]   79 y.o.   MRN: [id number] Visit Date: 06/24/2019  Today's Provider: Trinna Post, PA-C   Chief Complaint  Patient presents with  . Cough  . Sinusitis   Subjective:     Cough The current episode started 1 to 4 weeks ago. The problem has been unchanged. The cough is productive of sputum. Associated symptoms include nasal congestion, postnasal drip and shortness of breath. Pertinent negatives include no fever, headaches or sore throat.  Sinusitis The current episode started 1 to 4 weeks ago. The problem is unchanged. There has been no fever. Associated symptoms include coughing, shortness of breath and sinus pressure. Pertinent negatives include no headaches or sore throat.   Patient has been having sinus congestion for 3 weeks now. The congestion is on both sides. The congestion has not worsened significantly over the past three weeks. He denies fevers. He reports feeling SOB due to congestion. He is taking two tablets of a benadryl daily. He is taking OTC [facility] nasal spray and is unable to qualify which medication this is. He denies symptoms of double sickening. He reports his phlegm has been green colored in the past few days. Denies ear pain.   Allergies  Allergen Reactions  . Megace  [Megestrol] Shortness Of Breath and Palpitations     Current  Outpatient Medications:  .  albuterol (PROVENTIL HFA;VENTOLIN HFA) 108 (90 Base) MCG/ACT inhaler, INHALE 2 PUFFS INTO THE LUNGS EVERY 6 HOURS AS NEEDED FOR WHEEZING OR SHORTNESS OF BREATH, Disp: 8.5 g, Rfl: 3 .  aspirin 81 MG tablet, Take 1 tablet by mouth daily., Disp: , Rfl:  .  ibuprofen (ADVIL,MOTRIN) 200 MG tablet, Take 400 mg by mouth as needed., Disp: , Rfl:  .  pravastatin (PRAVACHOL) 80 MG tablet, Take 80 mg by mouth daily., Disp: , Rfl:  .  ramipril (ALTACE) 5 MG capsule, TAKE 1 CAPSULE BY MOUTH EVERY DAY, Disp: 30 capsule, Rfl: 12 .  Vitamin D, Ergocalciferol, (DRISDOL) 1.25 MG (50000 UT) CAPS capsule, TAKE 1 CAPSULE BY MOUTH EVERY 7 DAYS, Disp: 12 capsule, Rfl: 4 .  hydrOXYzine (ATARAX/VISTARIL) 10 MG tablet, Take 1-2 tablets (10-20 mg total) by mouth every 6 (six) hours as needed for itching. (Patient not taking: Reported on 06/24/2019), Disp: 30 tablet, Rfl: 0 .  ibandronate (BONIVA) 150 MG tablet, TAKE 1 TABLET(150 MG) BY MOUTH EVERY 30 DAYS (Patient not taking: Reported on 06/24/2019), Disp: 3 tablet, Rfl: 4  Review of Systems  Constitutional: Negative.  Negative for fever.  HENT: Positive for postnasal drip and sinus pressure. Negative for sore throat.   Eyes: Negative.   Respiratory: Positive for cough and shortness of breath.   Cardiovascular: Negative.   Gastrointestinal: Negative.   Endocrine: Negative.   Genitourinary: Negative.   Musculoskeletal: Negative.   Allergic/Immunologic: Negative.   Neurological: Negative.  Negative for headaches.  Hematological: Negative.  Psychiatric/Behavioral: Negative.     Social History   Tobacco Use  . Smoking status: Current Some Day Smoker    Types: Cigars    Last attempt to quit: 03/25/2003    Years since quitting: 16.2  . Smokeless tobacco: Never Used  . Tobacco comment: QUIT CIGARETTES 2004 only smoked 4-5 years.   Substance Use Topics  . Alcohol use: Yes    Comment: occasionally beer 1-2/monthly      Objective:    Temp (!) 96.7 F (35.9 C) (Oral)  Vitals:   06/24/19 1107  Temp: (!) 96.7 F (35.9 C)  TempSrc: Oral  There is no height or weight on file to calculate BMI.   Physical Exam   No results found for any visits on 06/24/19.     Assessment & Plan    1. Allergic rhinitis due to pollen, unspecified seasonality  I have counseled patient on the symptoms consistent with bacterial sinus infections. These include severe worsening over the course of 10 days, one sided pressure, double sickening. Counseled that color of sputum is unreliable predictor of bacterial sinus infection. Patient has been seen on multiple occasions previously for nasal congestion and was at one time using OTC decongestant spray. Was instructed to use flonase but unclear if he is doing so because he cannot name the current medication. Counseled his symptoms are more consistent with allergic rhinitis and he should be using a DAILY allegra, zyrtec, claritin, or xyzal. He may call back her on 07/05/2019 for antibiotic if he has done this without improvement in symptoms.   I discussed the assessment and treatment plan with the patient. The patient was provided an opportunity to ask questions and all were answered. The patient agreed with the plan and demonstrated an understanding of the instructions.   The patient was advised to call back or seek an in-person evaluation if the symptoms worsen or if the condition fails to improve as anticipated.  I provided 15 minutes of non-face-to-face time during this encounter.  The entirety of the information documented in the History of Present Illness, Review of Systems and Physical Exam were personally obtained by me. Portions of this information were initially documented by [facility] and reviewed by me for thoroughness and accuracy.      Roger Sailors, PA-C  [facility]

## 2019-06-29 ENCOUNTER — Other Ambulatory Visit: Payer: Self-pay | Admitting: General Practice

## 2019-06-29 MED ORDER — PRAVASTATIN SODIUM 80 MG PO TABS: 80.0000 mg | ORAL_TABLET | Freq: Every day | ORAL | 1 refills | Status: DC

## 2019-06-29 NOTE — Telephone Encounter (Signed)
Requested medication (s) are due for refill today: yes  Requested medication (s) are on the active medication list: historical medication and provider  Last refill:  03/27/2016  Future visit scheduled: no  Notes to clinic:  historical med and provider ([facility])   Requested Prescriptions  Pending Prescriptions Disp Refills   pravastatin (PRAVACHOL) 80 MG tablet       Sig: Take 1 tablet (80 mg total) by mouth daily.      Cardiovascular:  Antilipid - Statins Failed - 06/29/2019 12:13 PM      Failed - Total Cholesterol in normal range and within 360 days    Cholesterol, Total  Date Value Ref Range Status  04/24/2017 142 100 - 199 mg/dL Final          Failed - LDL in normal range and within 360 days    LDL Calculated  Date Value Ref Range Status  04/24/2017 70 0 - 99 mg/dL Final          Failed - HDL in normal range and within 360 days    HDL  Date Value Ref Range Status  04/24/2017 57 >39 mg/dL Final          Failed - Triglycerides in normal range and within 360 days    Triglycerides  Date Value Ref Range Status  04/24/2017 77 0 - 149 mg/dL Final          Passed - Patient is not pregnant      Passed - Valid encounter within last 12 months    Recent Outpatient Visits           5 days ago Allergic rhinitis due to pollen, unspecified seasonality   [employer], [place], PA-C   2 months ago Nasal congestion   [facility] Malva Limes, MD   8 months ago Nasal congestion   [facility] Malva Limes, MD   9 months ago Acute otitis media, unspecified otitis media type   [facility] Malva Limes, MD   11 months ago Acute otitis externa of left ear, unspecified type   [facility] [place], [place], [place]

## 2019-06-29 NOTE — Telephone Encounter (Signed)
RX REFILL pravastatin (PRAVACHOL) 80 MG tablet PHARMACY [id number] [place], [place] - [address] AT [facility] OF [address] Phone:  [phone]  Fax:  [phone]

## 2019-07-06 ENCOUNTER — Telehealth: Payer: Self-pay

## 2019-07-06 ENCOUNTER — Other Ambulatory Visit: Payer: Self-pay | Admitting: Family Medicine

## 2019-07-06 MED ORDER — PRAVASTATIN SODIUM 80 MG PO TABS: 80.0000 mg | ORAL_TABLET | Freq: Every day | ORAL | 1 refills | Status: AC

## 2019-07-06 NOTE — Telephone Encounter (Signed)
Copied from CRM [id number]. Topic: General - Other >> Jul 06, 2019  2:02 PM Dalphine Handing A wrote: Patient requested pravastatin (PRAVACHOL) 80 MG tablet medication a week ago and pharmacy still does not have medication. Patient would like a callback. Please advise

## 2019-07-07 DIAGNOSIS — M9902 Segmental and somatic dysfunction of thoracic region: Secondary | ICD-10-CM | POA: Diagnosis not present

## 2019-07-07 DIAGNOSIS — M4124 Other idiopathic scoliosis, thoracic region: Secondary | ICD-10-CM | POA: Diagnosis not present

## 2019-07-07 DIAGNOSIS — M5136 Other intervertebral disc degeneration, lumbar region: Secondary | ICD-10-CM | POA: Diagnosis not present

## 2019-07-07 DIAGNOSIS — M9903 Segmental and somatic dysfunction of lumbar region: Secondary | ICD-10-CM | POA: Diagnosis not present

## 2019-07-13 ENCOUNTER — Ambulatory Visit: Payer: Self-pay

## 2019-07-13 NOTE — Telephone Encounter (Signed)
Returned call to patient. He states that he is passing dark red blood with urination. She states it is bloody. He states he sees small clots. He denies pain, or any other symptoms. He takes a baby ASA every morning. This bleeding started yesterday evening and he has had 5-6 episodes since the onset.  He states that he has maybe had blood in his urine many years ago. Per protocol patient will go to ER for evaluation. Care advice read to patient. He verbalized understanding of all information.  Reason for Disposition . Passing pure blood or large blood clots (i.e., size > a dime) (Exception: fleck or small strands)  Answer Assessment - Initial Assessment Questions 1. COLOR of URINE: "Describe the color of the urine."  (e.g., tea-colored, pink, red, blood clots, bloody)     Deep red bloody small clots 2. ONSET: "When did the bleeding start?"     Yesterday evening 3. EPISODES: "How many times has there been blood in the urine?" or "How many times today?"     5-6 times 4. PAIN with URINATION: "Is there any pain with passing your urine?" If so, ask: "How bad is the pain?"  (Scale 1-10; or mild, moderate, severe)    - MILD - complains slightly about urination hurting    - MODERATE - interferes with normal activities      - SEVERE - excruciating, unwilling or unable to urinate because of the pain      none 5. FEVER: "Do you have a fever?" If so, ask: "What is your temperature, how was it measured, and when did it start?"     no 6. ASSOCIATED SYMPTOMS: "Are you passing urine more frequently than usual?"     no 7. OTHER SYMPTOMS: "Do you have any other symptoms?" (e.g., back/flank pain, abdominal pain, vomiting)    no 8. PREGNANCY: "Is there any chance you are pregnant?" "When was your last menstrual period?"  N/A  Protocols used: URINE - BLOOD IN-A-AH

## 2019-08-03 DIAGNOSIS — M4124 Other idiopathic scoliosis, thoracic region: Secondary | ICD-10-CM | POA: Diagnosis not present

## 2019-08-03 DIAGNOSIS — M9903 Segmental and somatic dysfunction of lumbar region: Secondary | ICD-10-CM | POA: Diagnosis not present

## 2019-08-03 DIAGNOSIS — M9902 Segmental and somatic dysfunction of thoracic region: Secondary | ICD-10-CM | POA: Diagnosis not present

## 2019-08-03 DIAGNOSIS — M5136 Other intervertebral disc degeneration, lumbar region: Secondary | ICD-10-CM | POA: Diagnosis not present

## 2019-08-11 ENCOUNTER — Other Ambulatory Visit: Payer: Self-pay | Admitting: Family Medicine

## 2019-08-11 DIAGNOSIS — J441 Chronic obstructive pulmonary disease with (acute) exacerbation: Secondary | ICD-10-CM

## 2019-08-11 NOTE — Telephone Encounter (Signed)
Requested Prescriptions  Pending Prescriptions Disp Refills  . albuterol (VENTOLIN HFA) 108 (90 Base) MCG/ACT inhaler [Pharmacy Med Name: ALBUTEROL HFA INH (200 PUFFS)8.5GM] 8.5 g 3    Sig: INHALE 2 PUFFS INTO THE LUNGS EVERY 6 HOURS AS NEEDED FOR WHEEZING OR SHORTNESS OF BREATH     Pulmonology:  Beta Agonists Failed - 08/11/2019  9:09 AM      Failed - One inhaler should last at least one month. If the patient is requesting refills earlier, contact the patient to check for uncontrolled symptoms.      Passed - Valid encounter within last 12 months    Recent Outpatient Visits          1 month ago Allergic rhinitis due to pollen, unspecified seasonality   [facility] Jodi Marble, [place], [place]   4 months ago Nasal congestion   [facility] Malva Limes, MD   9 months ago Nasal congestion   [facility] Malva Limes, MD   10 months ago Acute otitis media, unspecified otitis media type   [facility] Malva Limes, MD   1 year ago Acute otitis externa of left ear, unspecified type   [facility] [place], [place], [place]

## 2019-08-17 DIAGNOSIS — M5136 Other intervertebral disc degeneration, lumbar region: Secondary | ICD-10-CM | POA: Diagnosis not present

## 2019-08-17 DIAGNOSIS — M9903 Segmental and somatic dysfunction of lumbar region: Secondary | ICD-10-CM | POA: Diagnosis not present

## 2019-08-17 DIAGNOSIS — M4124 Other idiopathic scoliosis, thoracic region: Secondary | ICD-10-CM | POA: Diagnosis not present

## 2019-08-17 DIAGNOSIS — M9902 Segmental and somatic dysfunction of thoracic region: Secondary | ICD-10-CM | POA: Diagnosis not present

## 2019-08-31 DIAGNOSIS — M4124 Other idiopathic scoliosis, thoracic region: Secondary | ICD-10-CM | POA: Diagnosis not present

## 2019-08-31 DIAGNOSIS — M9903 Segmental and somatic dysfunction of lumbar region: Secondary | ICD-10-CM | POA: Diagnosis not present

## 2019-08-31 DIAGNOSIS — M9902 Segmental and somatic dysfunction of thoracic region: Secondary | ICD-10-CM | POA: Diagnosis not present

## 2019-08-31 DIAGNOSIS — M5136 Other intervertebral disc degeneration, lumbar region: Secondary | ICD-10-CM | POA: Diagnosis not present

## 2019-10-05 DIAGNOSIS — M4124 Other idiopathic scoliosis, thoracic region: Secondary | ICD-10-CM | POA: Diagnosis not present

## 2019-10-05 DIAGNOSIS — M5136 Other intervertebral disc degeneration, lumbar region: Secondary | ICD-10-CM | POA: Diagnosis not present

## 2019-10-05 DIAGNOSIS — M9903 Segmental and somatic dysfunction of lumbar region: Secondary | ICD-10-CM | POA: Diagnosis not present

## 2019-10-05 DIAGNOSIS — M9902 Segmental and somatic dysfunction of thoracic region: Secondary | ICD-10-CM | POA: Diagnosis not present

## 2019-10-20 ENCOUNTER — Encounter: Payer: Self-pay | Admitting: Physician Assistant

## 2019-10-20 ENCOUNTER — Other Ambulatory Visit: Payer: Self-pay

## 2019-10-20 ENCOUNTER — Ambulatory Visit: Payer: Medicare PPO | Admitting: Physician Assistant

## 2019-10-20 VITALS — BP 108/72 | HR 93 | Temp 96.9°F | Wt 102.6 lb

## 2019-10-20 DIAGNOSIS — R519 Headache, unspecified: Secondary | ICD-10-CM

## 2019-10-20 MED ORDER — MELOXICAM 7.5 MG PO TBDP: 7.5000 mg | ORAL_TABLET | Freq: Every day | ORAL | 0 refills | Status: DC

## 2019-10-20 NOTE — Patient Instructions (Signed)
General Headache Without Cause A headache is pain or discomfort that is felt around the head or neck area. There are many causes and types of headaches. In some cases, the cause may not be found. Follow these instructions at home: Watch your condition for any changes. Let your doctor know about them. Take these steps to help with your condition: Managing pain      Take over-the-counter and prescription medicines only as told by your doctor.  Lie down in a dark, quiet room when you have a headache.  If told, put ice on your head and neck area: ? Put ice in a plastic bag. ? Place a towel between your skin and the bag. ? Leave the ice on for 20 minutes, 2-3 times per day.  If told, put heat on the affected area. Use the heat source that your doctor recommends, such as a moist heat pack or a heating pad. ? Place a towel between your skin and the heat source. ? Leave the heat on for 20-30 minutes. ? Remove the heat if your skin turns bright red. This is very important if you are unable to feel pain, heat, or cold. You may have a greater risk of getting burned.  Keep lights dim if bright lights bother you or make your headaches worse. Eating and drinking  Eat meals on a regular schedule.  If you drink alcohol: ? Limit how much you use to:  0-1 drink a day for women.  0-2 drinks a day for men. ? Be aware of how much alcohol is in your drink. In the [place] one drink equals one 12 oz bottle of beer (355 mL), one 5 oz glass of wine (148 mL), or one 1 oz glass of hard liquor (44 mL).  Stop drinking caffeine, or reduce how much caffeine you drink. General instructions   Keep a journal to find out if certain things bring on headaches. For example, write down: ? What you eat and drink. ? How much sleep you get. ? Any change to your diet or medicines.  Get a massage or try other ways to relax.  Limit stress.  Sit up straight. Do not tighten (tense) your muscles.  Do not use any  products that contain nicotine or tobacco. This includes cigarettes, e-cigarettes, and chewing tobacco. If you need help quitting, ask your doctor.  Exercise regularly as told by your doctor.  Get enough sleep. This often means 7-9 hours of sleep each night.  Keep all follow-up visits as told by your doctor. This is important. Contact a doctor if:  Your symptoms are not helped by medicine.  You have a headache that feels different than the other headaches.  You feel sick to your stomach (nauseous) or you throw up (vomit).  You have a fever. Get help right away if:  Your headache gets very bad quickly.  Your headache gets worse after a lot of physical activity.  You keep throwing up.  You have a stiff neck.  You have trouble seeing.  You have trouble speaking.  You have pain in the eye or ear.  Your muscles are weak or you lose muscle control.  You lose your balance or have trouble walking.  You feel like you will pass out (faint) or you pass out.  You are mixed up (confused).  You have a seizure. Summary  A headache is pain or discomfort that is felt around the head or neck area.  There are many causes and   types of headaches. In some cases, the cause may not be found.  Keep a journal to help find out what causes your headaches. Watch your condition for any changes. Let your doctor know about them.  Contact a doctor if you have a headache that is different from usual, or if your headache is not helped by medicine.  Get help right away if your headache gets very bad, you throw up, you have trouble seeing, you lose your balance, or you have a seizure. This information is not intended to replace advice given to you by your health care provider. Make sure you discuss any questions you have with your health care provider. Document Revised: 09/29/2017 Document Reviewed: 09/29/2017 Elsevier Patient Education  2020 Elsevier Inc.  

## 2019-10-20 NOTE — Progress Notes (Signed)
Established patient visit   Patient: Roger Gutierrez   DOB: [birth year]   79 y.o. Male  MRN: [id number] Visit Date: 10/20/2019  Today's healthcare provider: Trey Sailors, PA-C   Chief Complaint  Patient presents with  . Headache  I,Cesia Orf M Elnore Cosens,acting as a scribe for Trey Sailors, PA-C.,have documented all relevant documentation on the behalf of Trey Sailors, PA-C,as directed by  Trey Sailors, PA-C while in the presence of Trey Sailors, PA-C.  Subjective    HPI HPI    Headache    Location Descriptor In the frontal area (Behind the eyes).  Characterized as ache, tightness, sharp pain, pressure, throbbing and stabbing.  Pain was noted as worst headache.  This started 6 days.  Occurs constant.  It is worse throughout the day.  Since onset it is gradually worsening.  Treatments attempted include rest and other.  Response to treatment was mild improvement.  Associated symptoms include eye pain, blurred vision, headache and numbness.  Negative for redness, dim vision, vision loss, transient vision loss, double vision, pain with eye movement, photophobia, flashing lights, visual disturbance, tingling, imbalance, dizziness, confusion, motion sickness, pulsating sound, neck pain and neck stiffness.       Last edited by Margo Common, CMA on 10/20/2019  3:39 PM. (History)    Taking ibuprofen 800 MG every 8 hours daily. Patient reports this is the worst headache of his life.        Medications: Outpatient Medications Prior to Visit  Medication Sig  . albuterol (VENTOLIN HFA) 108 (90 Base) MCG/ACT inhaler INHALE 2 PUFFS INTO THE LUNGS EVERY 6 HOURS AS NEEDED FOR WHEEZING OR SHORTNESS OF BREATH  . aspirin 81 MG tablet Take 1 tablet by mouth daily.  . hydrOXYzine (ATARAX/VISTARIL) 10 MG tablet Take 1-2 tablets (10-20 mg total) by mouth every 6 (six) hours as needed for itching.  . ibandronate (BONIVA) 150 MG tablet TAKE 1 TABLET(150 MG) BY MOUTH EVERY 30 DAYS  .  ibuprofen (ADVIL,MOTRIN) 200 MG tablet Take 400 mg by mouth as needed.  . pravastatin (PRAVACHOL) 80 MG tablet Take 1 tablet (80 mg total) by mouth daily.  . ramipril (ALTACE) 5 MG capsule TAKE 1 CAPSULE BY MOUTH EVERY DAY  . Vitamin D, Ergocalciferol, (DRISDOL) 1.25 MG (50000 UT) CAPS capsule TAKE 1 CAPSULE BY MOUTH EVERY 7 DAYS   No facility-administered medications prior to visit.    Review of Systems      Objective    BP 108/72 (BP Location: Left Arm, Patient Position: Sitting, Cuff Size: Small)   Pulse 93   Temp (!) 96.9 F (36.1 C) (Temporal)   Wt 102 lb 9.6 oz (46.5 kg)   SpO2 94%   BMI 17.61 kg/m    Physical Exam Constitutional:      Appearance: Normal appearance. He is well-developed.  HENT:     Mouth/Throat:     Mouth: Mucous membranes are moist.     Pharynx: Oropharynx is clear.  Eyes:     General: No visual field deficit.    Extraocular Movements: Extraocular movements intact.     Conjunctiva/sclera: Conjunctivae normal.     Pupils: Pupils are equal, round, and reactive to light.  Cardiovascular:     Rate and Rhythm: Normal rate and regular rhythm.     Pulses: Normal pulses.     Heart sounds: Normal heart sounds.  Pulmonary:     Effort: Pulmonary effort is normal.     Breath  sounds: Normal breath sounds.  Abdominal:     General: Abdomen is flat.     Palpations: Abdomen is soft.  Musculoskeletal:     Right upper leg: Normal.     Left upper leg: Normal.     Right knee: Normal.     Left knee: Normal.     Right lower leg: Normal.     Left lower leg: Normal.  Skin:    General: Skin is warm and dry.  Neurological:     General: No focal deficit present.     Mental Status: He is alert and oriented to person, place, and time.     GCS: GCS eye subscore is 4. GCS verbal subscore is 5. GCS motor subscore is 6.     Cranial Nerves: No cranial nerve deficit, dysarthria or facial asymmetry.     Sensory: No sensory deficit.     Motor: Motor function is intact.  No weakness.     Coordination: Coordination normal.     Gait: Gait normal.     Deep Tendon Reflexes: Reflexes are normal and symmetric.  Psychiatric:        Mood and Affect: Mood normal.        Behavior: Behavior normal.       No results found for any visits on 10/20/19.  Assessment & Plan    1. Frequent headaches  Patient reports this is the worst headache of his life. I have advised patient that if this is the case, he should be seen in the ER for emergent CT. Patient declines this. Counseled patient on risks of deferring this workup and patient reports understanding. Counseled a new or differing headache at this age warrants an outpatient CT head and he declines. Counseled patient we could do NSAIDs or prednisone to break headache cycle. Patient would like to do meloxicam.   - Meloxicam 7.5 MG TBDP; Take 7.5 mg by mouth daily.  Dispense: 30 tablet; Refill: 0    Return if symptoms worsen or fail to improve.      ITrey Sailors, PA-C, have reviewed all documentation for this visit. The documentation on 10/21/19 for the exam, diagnosis, procedures, and orders are all accurate and complete.    Maryella Shivers  [facility] [phone] (phone) [phone] (fax)  [facility]

## 2019-10-21 ENCOUNTER — Other Ambulatory Visit: Payer: Self-pay | Admitting: Family Medicine

## 2019-10-21 ENCOUNTER — Other Ambulatory Visit: Payer: Self-pay

## 2019-10-21 DIAGNOSIS — R519 Headache, unspecified: Secondary | ICD-10-CM

## 2019-10-21 NOTE — Telephone Encounter (Signed)
Copied from CRM [id number]. Topic: General - Inquiry >> Oct 21, 2019  2:54 PM Deborha Payment wrote: Reason for CRM: Patient is stating pharmacy has not received Meloxicam 7.5 MG that pollak sent over yesterday. Pharmacy [id number] - [place], [place] - [address] AT [facility] OF [address]  Phone:  [phone] Fax:  [phone]

## 2019-10-21 NOTE — Telephone Encounter (Signed)
Requested  medications are  due for refill today yes  Requested medications are on the active medication list yes  Last refill 7/13  Last visit 03/2017  Future visit scheduled No  Notes to clinic Failed protocol due to no office visit addressing dx/med and labs are out of date.

## 2019-10-21 NOTE — Addendum Note (Signed)
Addended by: Kavin Leech E on: 10/21/2019 05:16 PM   Modules accepted: Orders

## 2019-10-22 MED ORDER — MELOXICAM 7.5 MG PO TABS: 7.5000 mg | ORAL_TABLET | Freq: Every day | ORAL | 0 refills | Status: DC

## 2019-10-22 NOTE — Addendum Note (Signed)
Addended by: Trey Sailors on: 10/22/2019 09:50 AM   Modules accepted: Orders

## 2019-10-22 NOTE — Telephone Encounter (Signed)
Resent

## 2019-10-26 ENCOUNTER — Telehealth: Payer: Self-pay | Admitting: Family Medicine

## 2019-10-26 DIAGNOSIS — R519 Headache, unspecified: Secondary | ICD-10-CM

## 2019-10-26 NOTE — Telephone Encounter (Signed)
Pt saw adriana on 10/20/2019 and he is still having headaches and is requesting ct scan of his head. Pt does not want ct scan done at hospital. Please advise

## 2019-10-27 NOTE — Telephone Encounter (Signed)
Order placed

## 2019-10-28 ENCOUNTER — Encounter: Payer: Self-pay | Admitting: Emergency Medicine

## 2019-10-28 ENCOUNTER — Emergency Department
Admission: EM | Admit: 2019-10-28 | Discharge: 2019-10-28 | Disposition: A | Discharge status: Home / Self Care | Payer: Medicare PPO | Attending: Emergency Medicine | Admitting: Emergency Medicine

## 2019-10-28 ENCOUNTER — Emergency Department: Payer: Medicare PPO

## 2019-10-28 ENCOUNTER — Other Ambulatory Visit: Payer: Self-pay

## 2019-10-28 DIAGNOSIS — J449 Chronic obstructive pulmonary disease, unspecified: Secondary | ICD-10-CM | POA: Insufficient documentation

## 2019-10-28 DIAGNOSIS — Z7952 Long term (current) use of systemic steroids: Secondary | ICD-10-CM | POA: Insufficient documentation

## 2019-10-28 DIAGNOSIS — M899 Disorder of bone, unspecified: Secondary | ICD-10-CM | POA: Insufficient documentation

## 2019-10-28 DIAGNOSIS — Z87891 Personal history of nicotine dependence: Secondary | ICD-10-CM | POA: Diagnosis not present

## 2019-10-28 DIAGNOSIS — I10 Essential (primary) hypertension: Secondary | ICD-10-CM | POA: Diagnosis not present

## 2019-10-28 DIAGNOSIS — Z7982 Long term (current) use of aspirin: Secondary | ICD-10-CM | POA: Diagnosis not present

## 2019-10-28 DIAGNOSIS — Z79899 Other long term (current) drug therapy: Secondary | ICD-10-CM | POA: Diagnosis not present

## 2019-10-28 DIAGNOSIS — Z7951 Long term (current) use of inhaled steroids: Secondary | ICD-10-CM | POA: Diagnosis not present

## 2019-10-28 DIAGNOSIS — R519 Headache, unspecified: Secondary | ICD-10-CM | POA: Insufficient documentation

## 2019-10-28 LAB — COMPREHENSIVE METABOLIC PANEL
ALT: 14 U/L (ref 0–44)
AST: 18 U/L (ref 15–41)
Albumin: 4.2 g/dL (ref 3.5–5.0)
Alkaline Phosphatase: 57 U/L (ref 38–126)
Anion gap: 7 (ref 5–15)
BUN: 16 mg/dL (ref 8–23)
CO2: 27 mmol/L (ref 22–32)
Calcium: 9.5 mg/dL (ref 8.9–10.3)
Chloride: 101 mmol/L (ref 98–111)
Creatinine, Ser: 1.02 mg/dL (ref 0.61–1.24)
GFR calc Af Amer: >60 mL/min (ref >60)
GFR calc non Af Amer: >60 mL/min (ref >60)
Glucose, Bld: 105 mg/dL — ABNORMAL HIGH (ref 70–99)
Potassium: 4.8 mmol/L (ref 3.5–5.1)
Sodium: 135 mmol/L (ref 135–145)
Total Bilirubin: 0.9 mg/dL (ref 0.3–1.2)
Total Protein: 6.9 g/dL (ref 6.5–8.1)

## 2019-10-28 LAB — CBC
HCT: 47.3 % (ref 39.0–52.0)
Hemoglobin: 17 g/dL (ref 13.0–17.0)
MCH: 33.6 pg (ref 26.0–34.0)
MCHC: 35.9 g/dL (ref 30.0–36.0)
MCV: 93.5 fL (ref 80.0–100.0)
Platelets: 256 10*3/uL (ref 150–400)
RBC: 5.06 MIL/uL (ref 4.22–5.81)
RDW: 14.1 % (ref 11.5–15.5)
WBC: 7.7 10*3/uL (ref 4.0–10.5)
nRBC: 0 % (ref 0.0–0.2)

## 2019-10-28 MED ORDER — PROMETHAZINE HCL 25 MG/ML IJ SOLN
12.5000 mg | Freq: Once | INTRAMUSCULAR | Status: AC
  Administered 2019-10-28: 12.5 mg via INTRAMUSCULAR
  Filled 2019-10-28: qty 1

## 2019-10-28 MED ORDER — DIPHENHYDRAMINE HCL 50 MG/ML IJ SOLN
50.0000 mg | Freq: Once | INTRAMUSCULAR | Status: DC
  Filled 2019-10-28: qty 1

## 2019-10-28 MED ORDER — BUTALBITAL-APAP-CAFFEINE 50-325-40 MG PO TABS: 1.0000 | ORAL_TABLET | Freq: Four times a day (QID) | ORAL | 0 refills | Status: DC | PRN

## 2019-10-28 MED ORDER — DEXAMETHASONE SODIUM PHOSPHATE 10 MG/ML IJ SOLN
10.0000 mg | Freq: Once | INTRAMUSCULAR | Status: AC
  Administered 2019-10-28: 10 mg via INTRAMUSCULAR
  Filled 2019-10-28: qty 1

## 2019-10-28 NOTE — Telephone Encounter (Signed)
Patient called back and said he decided that he does want an MRI scheduled.  Please advise.

## 2019-10-28 NOTE — ED Notes (Signed)
No med reaction. Pt ambulatory independently with a steady gait.

## 2019-10-28 NOTE — ED Triage Notes (Signed)
Pt states left sided headache above eye brow and behind his eye for about 2.5 weeks now, dr had him try meloxicam with some relief. Pt feels like it could be sinus pain, did try ibuprofen and decongestant with some relief as well, however, nothing is working anymore. Denies nausea or sensitivity to light or sound. Alert and oriented. NAD.

## 2019-10-28 NOTE — Telephone Encounter (Signed)
Order placed. Please schedule

## 2019-10-28 NOTE — ED Provider Notes (Signed)
[facility] Emergency Department Provider Note  ____________________________________________  Time seen: Approximately 9:30 PM  I have reviewed the triage vital signs and the nursing notes.   HISTORY  Chief Complaint Headache    HPI Roger Gutierrez is a 79 y.o. male who presents the emergency department complaining of left-sided headache x2 weeks.  Patient has had a chronic constant headache x2 weeks.  Patient states that is all left frontal region.  He saw his primary care but did not want imaging initially.  Patient has been taking meloxicam which dulled the headache somewhat but did not fully alleviate it.  Patient denies any visual changes, neck pain or stiffness.  Patient denies any weakness on one side of the body or the other.  No difficulty formulating thoughts or words.  Patient with no history of CVA.  Other than meloxicam patient is not been taking medications for this complaint.  He does have a history of carotid artery stenosis, MI, COPD, cardiomyopathy, hypertension.  No complaints of chronic medical issues         Past Medical History:  Diagnosis Date  . Allergy   . Cataract    removed  . COPD (chronic obstructive pulmonary disease) (HCC)   . Hypertension   . Myocardial infarction (HCC)   . Osteoporosis     Patient Active Problem List   Diagnosis Date Noted  . Bilateral carotid artery stenosis 12/16/2016  . Inferior myocardial infarction (HCC) 12/16/2016  . Depression 10/29/2016  . Insomnia 10/29/2016  . Vitamin D deficiency 03/29/2016  . Cigar smoker 01/13/2015  . Arteriosclerosis of coronary artery 12/16/2014  . Anorexia 12/15/2014  . Long term current use of systemic steroids 12/15/2014  . CAFL (chronic airflow limitation) (HCC) 12/15/2014  . Failure of erection 12/15/2014  . Hypercholesteremia 12/15/2014  . OP (osteoporosis) 12/15/2014  . Ischemic cardiomyopathy 06/03/2014  . Abnormal loss of weight 04/03/2009  . H/O acute  poliomyelitis 12/20/2007  . Scoliosis 12/20/2007  . Benign hypertension 07/09/2007    Past Surgical History:  Procedure Laterality Date  . ANGIOPLASTY     cardiac; 3 stents  . ankel fusion Left 1958  . EYE SURGERY Bilateral 08/2011 and 09/2011   cataract extraction  . TONSILLECTOMY AND ADENOIDECTOMY  1950's    Prior to Admission medications   Medication Sig Start Date End Date Taking? Authorizing Provider  albuterol (VENTOLIN HFA) 108 (90 Base) MCG/ACT inhaler INHALE 2 PUFFS INTO THE LUNGS EVERY 6 HOURS AS NEEDED FOR WHEEZING OR SHORTNESS OF BREATH 08/11/19   Malva LimesFisher, Donald E, MD  aspirin 81 MG tablet Take 1 tablet by mouth daily.    [provider]  butalbital-acetaminophen-caffeine (FIORICET) [phone] MG tablet Take 1-2 tablets by mouth every 6 (six) hours as needed for headache. 10/28/19 10/27/20  Ivanell Deshotel, Delorise RoyalsJonathan D, PA-C  hydrOXYzine (ATARAX/VISTARIL) 10 MG tablet Take 1-2 tablets (10-20 mg total) by mouth every 6 (six) hours as needed for itching. 10/21/17   Malva LimesFisher, Donald E, MD  ibandronate (BONIVA) 150 MG tablet TAKE 1 TABLET(150 MG) BY MOUTH EVERY 30 DAYS 07/30/18   Malva LimesFisher, Donald E, MD  ibuprofen (ADVIL,MOTRIN) 200 MG tablet Take 400 mg by mouth as needed.    [provider]  meloxicam (MOBIC) 7.5 MG tablet Take 1 tablet (7.5 mg total) by mouth daily. 10/22/19   Trey SailorsPollak, Adriana M, PA-C  pravastatin (PRAVACHOL) 80 MG tablet Take 1 tablet (80 mg total) by mouth daily. 07/06/19   Malva LimesFisher, Donald E, MD  ramipril (ALTACE) 5  MG capsule TAKE 1 CAPSULE BY MOUTH EVERY DAY 05/23/19   Malva Limes, MD  Vitamin D, Ergocalciferol, (DRISDOL) 1.25 MG (50000 UT) CAPS capsule TAKE 1 CAPSULE BY MOUTH EVERY 7 DAYS 08/01/18   Malva Limes, MD    Allergies Megace  [megestrol]  Family History  Problem Relation Age of Onset  . Hypertension Mother   . Healthy Sister     Social History Social History   Tobacco Use  . Smoking status: Current Some Day Smoker    Types: Cigars     Last attempt to quit: 03/25/2003    Years since quitting: 16.6  . Smokeless tobacco: Never Used  . Tobacco comment: QUIT CIGARETTES 2004 only smoked 4-5 years.   Vaping Use  . Vaping Use: Never used  Substance Use Topics  . Alcohol use: Yes    Comment: occasionally beer 1-2/monthly  . Drug use: No     Review of Systems  Constitutional: No fever/chills Eyes: No visual changes. No discharge ENT: No upper respiratory complaints. Cardiovascular: no chest pain. Respiratory: no cough. No SOB. Gastrointestinal: No abdominal pain.  No nausea, no vomiting.  No diarrhea.  No constipation. Musculoskeletal: Negative for musculoskeletal pain. Skin: Negative for rash, abrasions, lacerations, ecchymosis. Neurological: Left-sided headache in the frontal region but denies focal weakness or numbness. 10-point ROS otherwise negative.  ____________________________________________   PHYSICAL EXAM:  VITAL SIGNS: ED Triage Vitals  Enc Vitals Group     BP 10/28/19 1704 124/83     Pulse Rate 10/28/19 1704 94     Resp 10/28/19 1704 18     Temp 10/28/19 1704 98 F (36.7 C)     Temp Source 10/28/19 1704 Oral     SpO2 10/28/19 1704 99 %     Weight 10/28/19 1707 102 lb (46.3 kg)     Height 10/28/19 1707 5\' 5"  (1.651 m)     Head Circumference --      Peak Flow --      Pain Score 10/28/19 1706 10     Pain Loc --      Pain Edu? --      Excl. in GC? --      Constitutional: Alert and oriented. Well appearing and in no acute distress. Eyes: Conjunctivae are normal. PERRL. EOMI. Head: Atraumatic. ENT:      Ears:       Nose: No congestion/rhinnorhea.  No significant congestion.  No erythema or bogginess of the turbinates.  Nontender to percussion over the frontal, maxillary and ethmoid sinuses.      Mouth/Throat: Mucous membranes are moist.  Neck: No stridor.  No cervical spine tenderness to palpation.  Neck is supple full range of motion.  No bruits.  Cardiovascular: Normal rate, regular  rhythm. Normal S1 and S2.  Good peripheral circulation. Respiratory: Normal respiratory effort without tachypnea or retractions. Lungs CTAB. Good air entry to the bases with no decreased or absent breath sounds. Musculoskeletal: Full range of motion to all extremities. No gross deformities appreciated. Neurologic:  Normal speech and language. No gross focal neurologic deficits are appreciated.  Cranial nerves II through XII grossly intact. Skin:  Skin is warm, dry and intact. No rash noted. Psychiatric: Mood and affect are normal. Speech and behavior are normal. Patient exhibits appropriate insight and judgement.   ____________________________________________   LABS (all labs ordered are listed, but only abnormal results are displayed)  Labs Reviewed  COMPREHENSIVE METABOLIC PANEL - Abnormal; Notable for the following components:  Result Value   Glucose, Bld 105 (*)    All other components within normal limits  CBC   ____________________________________________  EKG   ____________________________________________  RADIOLOGY I personally viewed and evaluated these images as part of my medical decision making, as well as reviewing the written report by the radiologist.  CT Head Wo Contrast  Result Date: 10/28/2019 CLINICAL DATA:  New daily headache for 2 weeks, no history of headache syndromes, left sided headache EXAM: CT HEAD WITHOUT CONTRAST TECHNIQUE: Contiguous axial images were obtained from the base of the skull through the vertex without intravenous contrast. COMPARISON:  None. FINDINGS: Brain: No evidence of acute infarction, hemorrhage, hydrocephalus, extra-axial collection or mass lesion/mass effect. Prominence of the ventricles, cisterns and sulci compatible with parenchymal volume loss. Slight asymmetry of the lateral ventricles may be within physiologic normal. Patchy areas of white matter hypoattenuation are most compatible with chronic microvascular angiopathy.  Vascular: Atherosclerotic calcification of the carotid siphons and intradural vertebral arteries. No hyperdense vessel. Skull: Ill-defined lucency within the outer table of right frontal bone best seen on coronal reconstruction (4/10). Background of diffuse bony demineralization. Thinning of the left parietal bone posteriorly is a more nonspecific and often senescent related finding. No acute fracture. No acute abnormalities scalp. Sinuses/Orbits: Paranasal sinuses and mastoid air cells are predominantly clear. Orbital structures are unremarkable aside from prior lens extractions. Other: None IMPRESSION: 1. No acute intracranial abnormality. 2. Ill-defined lucency within the outer table of the right frontal bone. Appearance is nonspecific, without particularly aggressive features such as a soft tissue component. Possibly reflecting a lytic bone metastasis myeloma, metabolic process or atypical appearance of a hemangioma. Correlate with patient history and consider outpatient bone survey to assess for additional sites of disease. 3. Thinning of the left parietal bone posteriorly is a more nonspecific, often senescent related finding. 4. Chronic microvascular angiopathy and parenchymal volume loss. 5. Intracranial atherosclerosis. Electronically Signed   By: Kreg Shropshire M.D.   On: 10/28/2019 20:35    ____________________________________________    PROCEDURES  Procedure(s) performed:    Procedures    Medications  diphenhydrAMINE (BENADRYL) injection 50 mg (50 mg Intramuscular Not Given 10/28/19 2200)  dexamethasone (DECADRON) injection 10 mg (10 mg Intramuscular Given 10/28/19 2155)  promethazine (PHENERGAN) injection 12.5 mg (12.5 mg Intramuscular Given 10/28/19 2157)     ____________________________________________   INITIAL IMPRESSION / ASSESSMENT AND PLAN / ED COURSE  Pertinent labs & imaging results that were available during my care of the patient were reviewed by me and considered in my  medical decision making (see chart for details).  Review of the [place] CSRS was performed in accordance of the [facility] prior to dispensing any controlled drugs.           Patient's diagnosis is consistent with headache, nonspecific bony lesion of the right frontal skull.  Patient presents emergency department with left-sided headache x2 weeks.  No trauma.  No history of CVA.  No concerning neuro symptoms on physical exam.  Due to the new onset of atypical headache for the patient, I imaged with a CT scan.  There is a nonspecific focal bony lesion in the right frontal skull.  This could be indicative of metastatic disease, hemangioma, metabolic process.  At this time this is on the opposite side of the headache.  I have recommended follow-up with his primary care for MRI which patient already has orders for as well as bone scan.  I discussed the findings, differential with the patient.  He  verbalizes understanding of same.  I will provide migraine cocktail here in the emergency department for symptom relief.  Fioricet for additional headache relief and follow-up with primary care for further investigation of this nonspecific lesion.  Return precautions discussed at length with the patient..Patient is given ED precautions to return to the ED for any worsening or new symptoms.     ____________________________________________  FINAL CLINICAL IMPRESSION(S) / ED DIAGNOSES  Final diagnoses:  Acute intractable headache, unspecified headache type  Bone lesion      NEW MEDICATIONS STARTED DURING THIS VISIT:  ED Discharge Orders         Ordered    butalbital-acetaminophen-caffeine (FIORICET) 50-325-40 MG tablet  Every 6 hours PRN     Discontinue  Reprint     10/28/19 2148              This chart was dictated using voice recognition software/Dragon. Despite best efforts to proofread, errors can occur which can change the meaning. Any change was purely unintentional. '   Alm Bustard Renold Don 10/28/19 2307    Chesley Noon, MD 10/28/19 [phone]

## 2019-11-01 ENCOUNTER — Ambulatory Visit (INDEPENDENT_AMBULATORY_CARE_PROVIDER_SITE_OTHER): Payer: Medicare PPO | Admitting: Family Medicine

## 2019-11-01 ENCOUNTER — Encounter: Payer: Self-pay | Admitting: Family Medicine

## 2019-11-01 ENCOUNTER — Other Ambulatory Visit: Payer: Self-pay | Admitting: Family Medicine

## 2019-11-01 DIAGNOSIS — R519 Headache, unspecified: Secondary | ICD-10-CM

## 2019-11-01 DIAGNOSIS — M899 Disorder of bone, unspecified: Secondary | ICD-10-CM | POA: Insufficient documentation

## 2019-11-01 DIAGNOSIS — Z125 Encounter for screening for malignant neoplasm of prostate: Secondary | ICD-10-CM | POA: Diagnosis not present

## 2019-11-01 MED ORDER — HYDROCODONE-ACETAMINOPHEN 7.5-325 MG PO TABS: 1.0000 | ORAL_TABLET | Freq: Four times a day (QID) | ORAL | 0 refills | Status: AC | PRN

## 2019-11-01 MED ORDER — MELOXICAM 15 MG PO TABS: 15.0000 mg | ORAL_TABLET | Freq: Every day | ORAL | 0 refills | Status: DC

## 2019-11-01 NOTE — Progress Notes (Signed)
Virtual telephone visit    Virtual Visit via Telephone Note   This visit type was conducted due to national recommendations for restrictions regarding the COVID-19 Pandemic (e.g. social distancing) in an effort to limit this patient's exposure and mitigate transmission in our community. Due to his co-morbid illnesses, this patient is at least at moderate risk for complications without adequate follow up. This format is felt to be most appropriate for this patient at this time. The patient did not have access to video technology or had technical difficulties with video requiring transitioning to audio format only (telephone). Physical exam was limited to content and character of the telephone converstion.    Patient location: home Provider location: [facility]   Visit Date: 11/01/2019  Today's healthcare provider: [name], MD   Chief Complaint  Patient presents with   Headache   Follow-up   Subjective    HPI  Follow up ER visit Patient was seen in ER for Acute intractable headache and bone lesion  on 10/28/2019. Treatment for this included giving patient a migraine cocktail  in the emergency department for symptom relief. Upon discharge patient was prescribed Butalbital-acetaminophen-caffeine (FIORICET) 50-325-40 MG tablet  Every 6 hours PRN. Patient was advised to follow up with his PCP for further investigation of lytic right frontal lucency seen on CT scan.  He reports good compliance with treatment. He reports this condition is Unchanged. States Fioricet did not help at all. Was previously prescribed meloxicam 7.5 by Marykay Lex which helps for a few hours, then headache returns. Headache has been keeping him up at night.  Patient still has a headache. He rates pain 10/10. He says the Fioricet is not helping to relieve headache. He denies any nausea, vomiting or blurred vision.   -----------------------------------------------------------------------------------------       Medications: Outpatient Medications Prior to Visit  Medication Sig   albuterol (VENTOLIN HFA) 108 (90 Base) MCG/ACT inhaler INHALE 2 PUFFS INTO THE LUNGS EVERY 6 HOURS AS NEEDED FOR WHEEZING OR SHORTNESS OF BREATH   aspirin 81 MG tablet Take 1 tablet by mouth daily.   ibandronate (BONIVA) 150 MG tablet TAKE 1 TABLET(150 MG) BY MOUTH EVERY 30 DAYS   ibuprofen (ADVIL,MOTRIN) 200 MG tablet Take 400 mg by mouth as needed.   meloxicam (MOBIC) 7.5 MG tablet Take 1 tablet (7.5 mg total) by mouth daily.   pravastatin (PRAVACHOL) 80 MG tablet Take 1 tablet (80 mg total) by mouth daily.   ramipril (ALTACE) 5 MG capsule TAKE 1 CAPSULE BY MOUTH EVERY DAY   Vitamin D, Ergocalciferol, (DRISDOL) 1.25 MG (50000 UT) CAPS capsule TAKE 1 CAPSULE BY MOUTH EVERY 7 DAYS   butalbital-acetaminophen-caffeine (FIORICET) 50-325-40 MG tablet Take 1-2 tablets by mouth every 6 (six) hours as needed for headache. (Patient not taking: Reported on 11/01/2019)   [DISCONTINUED] hydrOXYzine (ATARAX/VISTARIL) 10 MG tablet Take 1-2 tablets (10-20 mg total) by mouth every 6 (six) hours as needed for itching.   No facility-administered medications prior to visit.    Review of Systems  Constitutional: Negative for appetite change, chills and fever.  Respiratory: Negative for chest tightness, shortness of breath and wheezing.   Cardiovascular: Negative for chest pain and palpitations.  Gastrointestinal: Negative for abdominal pain, nausea and vomiting.  Neurological: Positive for headaches.     Objective    There were no vitals taken for this visit. Awake, alert, oriented x 3. In no apparent distress     Assessment & Plan     1. Lytic  lesion of bone on x-ray  - Protein Electrophoresis, (serum); Future  2. Prostate cancer screening  - PSA; Future  3. Frequent headaches No relief from Fioricet. MRI is in process of being scheduled. Will try prn  - HYDROcodone-acetaminophen (NORCO) 7.5-325 MG tablet;  Take 1 tablet by mouth every 6 (six) hours as needed for up to 5 days for moderate pain.  Dispense: 20 tablet; Refill: 0 Does get temporary relief with low dose of meloxicam, will increase to  meloxicam (MOBIC) 15 MG tablet; Take 1 tablet (15 mg total) by mouth daily.  Dispense: 30 tablet; Refill: 0   No follow-ups on file.    I discussed the assessment and treatment plan with the patient. The patient was provided an opportunity to ask questions and all were answered. The patient agreed with the plan and demonstrated an understanding of the instructions.   The patient was advised to call back or seek an in-person evaluation if the symptoms worsen or if the condition fails to improve as anticipated.  I provided 14 minutes of non-face-to-face time during this encounter.  I discussed the limitations of evaluation and management by telemedicine and the availability of in person appointments. The patient expressed understanding and agreed to proceed.     [name], MD [facility] [phone] (phone) [phone] (fax)  [facility]

## 2019-11-02 ENCOUNTER — Other Ambulatory Visit: Payer: Self-pay

## 2019-11-02 DIAGNOSIS — M899 Disorder of bone, unspecified: Secondary | ICD-10-CM | POA: Diagnosis not present

## 2019-11-02 DIAGNOSIS — Z125 Encounter for screening for malignant neoplasm of prostate: Secondary | ICD-10-CM

## 2019-11-04 LAB — PROTEIN ELECTROPHORESIS, SERUM
A/G Ratio: 1.5 (ref 0.7–1.7)
Albumin ELP: 3.7 g/dL (ref 2.9–4.4)
Alpha 1: 0.3 g/dL (ref 0.0–0.4)
Alpha 2: 0.7 g/dL (ref 0.4–1.0)
Beta: 0.8 g/dL (ref 0.7–1.3)
Gamma Globulin: 0.7 g/dL (ref 0.4–1.8)
Globulin, Total: 2.4 g/dL (ref 2.2–3.9)
Total Protein: 6.1 g/dL (ref 6.0–8.5)

## 2019-11-04 LAB — PSA: Prostate Specific Ag, Serum: 0.3 ng/mL (ref 0.0–4.0)

## 2019-11-09 DIAGNOSIS — M9903 Segmental and somatic dysfunction of lumbar region: Secondary | ICD-10-CM | POA: Diagnosis not present

## 2019-11-09 DIAGNOSIS — M5136 Other intervertebral disc degeneration, lumbar region: Secondary | ICD-10-CM | POA: Diagnosis not present

## 2019-11-09 DIAGNOSIS — M9902 Segmental and somatic dysfunction of thoracic region: Secondary | ICD-10-CM | POA: Diagnosis not present

## 2019-11-09 DIAGNOSIS — M4124 Other idiopathic scoliosis, thoracic region: Secondary | ICD-10-CM | POA: Diagnosis not present

## 2019-11-16 ENCOUNTER — Telehealth: Payer: Self-pay

## 2019-11-16 ENCOUNTER — Other Ambulatory Visit: Payer: Self-pay | Admitting: Family Medicine

## 2019-11-16 ENCOUNTER — Ambulatory Visit: Payer: Self-pay

## 2019-11-16 DIAGNOSIS — R519 Headache, unspecified: Secondary | ICD-10-CM

## 2019-11-16 MED ORDER — MELOXICAM 15 MG PO TABS: 15.0000 mg | ORAL_TABLET | Freq: Every day | ORAL | 0 refills | Status: DC

## 2019-11-16 NOTE — Telephone Encounter (Signed)
Patient requesting meloxicam (MOBIC) 15 MG tablet, informed patient please allow 48 to 72 hour turn around time   [facility] #[id number] [place], [place] - [address] AT [facility] OF [address] Phone:  [phone]  Fax:  [phone]

## 2019-11-16 NOTE — Telephone Encounter (Signed)
Left message to call back. Ok for PEC to advise pt. 

## 2019-11-16 NOTE — Telephone Encounter (Signed)
Please advise. Thanks.  

## 2019-11-16 NOTE — Telephone Encounter (Signed)
Pt. Calling to report Dr. Sherrie Mustache started him on Meloxicam for his headaches, one pill daily. States he has increased this to two pills daily and "it has really helped." Asking if Dr. Sherrie Mustache will ok this and note it on his medication list. Please advise pt.  PG 1  Roger Gutierrez Male, 79 y.o., 24-Jan-1941 MRN:  [id number] Phone:  [phone] (H) PCP:  Malva Limes, MD Coverage:  [facility] Medicare/[employer] Medicare Choice Ppo Next Appt With Radiology 11/22/2019 at 7:00 PM Message from Elliot Gault sent at 11/16/2019 8:51 AM EDT  Summary: Medication Management   Patient seeking clinical advice meloxicam (MOBIC) 15 MG tablet prescribed for 1x daily but has been taking 2x, due to ongoing head ache. Will create a TE for medication request.         Call History   Type Contact Phone  11/16/2019 08:49 AM EDT Phone (Incoming) Roger Gutierrez, Roger Gutierrez (Self) [phone] (H)  User: Elliot Gault    Answer Assessment - Initial Assessment Questions 1. LOCATION: "Where does it hurt?"      Hurts left side of headache 2. ONSET: "When did the headache start?" (Minutes, hours or days)      A few weeks ago 3. PATTERN: "Does the pain come and go, or has it been constant since it started?"     Comes and goes 4. SEVERITY: "How bad is the pain?" and "What does it keep you from doing?"  (e.g., Scale 1-10; mild, moderate, or severe)   - MILD (1-3): doesn't interfere with normal activities    - MODERATE (4-7): interferes with normal activities or awakens from sleep    - SEVERE (8-10): excruciating pain, unable to do any normal activities        10 5. RECURRENT SYMPTOM: "Have you ever had headaches before?" If Yes, ask: "When was the last time?" and "What happened that time?"      No 6. CAUSE: "What do you think is causing the headache?"     Unsure 7. MIGRAINE: "Have you been diagnosed with migraine headaches?" If Yes, ask: "Is this headache similar?"      No 8. HEAD INJURY: "Has there been any recent  injury to the head?"      No 9. OTHER SYMPTOMS: "Do you have any other symptoms?" (fever, stiff neck, eye pain, sore throat, cold symptoms)     No 10. PREGNANCY: "Is there any chance you are pregnant?" "When was your last menstrual period?"       n/a  Protocols used: HEADACHE-A-AH

## 2019-11-16 NOTE — Telephone Encounter (Signed)
Advised patient as below. He verbally understands. He is requesting another refill of Meloxicam due to being almost out. He also reports that he will have his MRI done on Monday 8/30.

## 2019-11-16 NOTE — Telephone Encounter (Signed)
Copied from CRM [id number]. Topic: General - Call Back - No Documentation >> Nov 16, 2019  1:42 PM Roger Gutierrez wrote: Reason for CRM: Patient is returning call from practice, not sure what it could be about. Please call patient back.

## 2019-11-16 NOTE — Telephone Encounter (Signed)
The maximum approved dose of meloxicam is 15mg  a day. Taking higher doses can cause heart problems, kidney problems, and  increase risk of strokes. Its probably OK to take a second tablet occasionally, but not every day. Twice a week at most.  Also, if is still having to take a medication every day for headaches, he really needs to go ahead and get the MRI due to abnormality that was seen on  His head CT.

## 2019-11-22 ENCOUNTER — Ambulatory Visit
Admission: RE | Admit: 2019-11-22 | Discharge: 2019-11-22 | Disposition: A | Discharge status: Home / Self Care | Payer: Medicare PPO | Source: Ambulatory Visit | Attending: Family Medicine | Admitting: Family Medicine

## 2019-11-22 ENCOUNTER — Other Ambulatory Visit: Payer: Self-pay

## 2019-11-22 DIAGNOSIS — R519 Headache, unspecified: Secondary | ICD-10-CM | POA: Insufficient documentation

## 2019-11-22 DIAGNOSIS — Q283 Other malformations of cerebral vessels: Secondary | ICD-10-CM | POA: Diagnosis not present

## 2019-11-22 DIAGNOSIS — G319 Degenerative disease of nervous system, unspecified: Secondary | ICD-10-CM | POA: Diagnosis not present

## 2019-11-22 DIAGNOSIS — Q279 Congenital malformation of peripheral vascular system, unspecified: Secondary | ICD-10-CM | POA: Diagnosis not present

## 2019-11-22 MED ORDER — GADOBUTROL 1 MMOL/ML IV SOLN
4.0000 mL | Freq: Once | INTRAVENOUS | Status: AC | PRN
  Administered 2019-11-22: 4 mL via INTRAVENOUS

## 2019-12-01 DIAGNOSIS — R519 Headache, unspecified: Secondary | ICD-10-CM | POA: Diagnosis not present

## 2019-12-01 DIAGNOSIS — M62830 Muscle spasm of back: Secondary | ICD-10-CM | POA: Diagnosis not present

## 2019-12-01 DIAGNOSIS — M5033 Other cervical disc degeneration, cervicothoracic region: Secondary | ICD-10-CM | POA: Diagnosis not present

## 2019-12-01 DIAGNOSIS — M9901 Segmental and somatic dysfunction of cervical region: Secondary | ICD-10-CM | POA: Diagnosis not present

## 2019-12-08 DIAGNOSIS — R519 Headache, unspecified: Secondary | ICD-10-CM | POA: Diagnosis not present

## 2019-12-08 DIAGNOSIS — M9901 Segmental and somatic dysfunction of cervical region: Secondary | ICD-10-CM | POA: Diagnosis not present

## 2019-12-08 DIAGNOSIS — M5033 Other cervical disc degeneration, cervicothoracic region: Secondary | ICD-10-CM | POA: Diagnosis not present

## 2019-12-08 DIAGNOSIS — M62830 Muscle spasm of back: Secondary | ICD-10-CM | POA: Diagnosis not present

## 2019-12-15 DIAGNOSIS — M62830 Muscle spasm of back: Secondary | ICD-10-CM | POA: Diagnosis not present

## 2019-12-15 DIAGNOSIS — M9901 Segmental and somatic dysfunction of cervical region: Secondary | ICD-10-CM | POA: Diagnosis not present

## 2019-12-15 DIAGNOSIS — M5033 Other cervical disc degeneration, cervicothoracic region: Secondary | ICD-10-CM | POA: Diagnosis not present

## 2019-12-15 DIAGNOSIS — R519 Headache, unspecified: Secondary | ICD-10-CM | POA: Diagnosis not present

## 2019-12-16 ENCOUNTER — Other Ambulatory Visit: Payer: Self-pay | Admitting: Family Medicine

## 2019-12-16 DIAGNOSIS — R519 Headache, unspecified: Secondary | ICD-10-CM

## 2019-12-16 MED ORDER — MELOXICAM 15 MG PO TABS: 15.0000 mg | ORAL_TABLET | Freq: Every day | ORAL | 0 refills | Status: DC

## 2019-12-16 NOTE — Telephone Encounter (Signed)
Medication Refill - Medication: Meloxicam   Has the patient contacted their pharmacy? Yes.   (Agent: If no, request that the patient contact the pharmacy for the refill.) (Agent: If yes, when and what did the pharmacy advise?)  Preferred Pharmacy (with phone number or street name):  [facility] #[id number] [place], [place] - [address] AT [facility] OF [address]  [address] [place] [address]  Phone: [phone] Fax: [phone]  Hours: Not open 24 hours     Agent: Please be advised that RX refills may take up to 3 business days. We ask that you follow-up with your pharmacy.

## 2019-12-16 NOTE — Telephone Encounter (Signed)
Requested Prescriptions  Pending Prescriptions Disp Refills  . meloxicam (MOBIC) 15 MG tablet 30 tablet 0    Sig: Take 1 tablet (15 mg total) by mouth daily.     Analgesics:  COX2 Inhibitors Passed - 12/16/2019  8:07 AM      Passed - HGB in normal range and within 360 days    Hemoglobin  Date Value Ref Range Status  10/28/2019 17.0 13.0 - 17.0 g/dL Final   HGB  Date Value Ref Range Status  11/10/2012 16.7 13.0 - 18.0 g/dL Final         Passed - Cr in normal range and within 360 days    Creatinine  Date Value Ref Range Status  11/10/2012 0.97 0.60 - 1.30 mg/dL Final   Creatinine, Ser  Date Value Ref Range Status  10/28/2019 1.02 0.61 - 1.24 mg/dL Final         Passed - Patient is not pregnant      Passed - Valid encounter within last 12 months    Recent Outpatient Visits          1 month ago Lytic lesion of bone on x-ray   [facility] Malva Limes, MD   1 month ago Frequent headaches   [facility] [place] Ang[place], [place]   5 months ago Allergic rhinitis due to pollen, unspecified seasonality   [facility] [place], [place], [place]   8 months ago Nasal congestion   [facility] Malva Limes, MD   1 year ago Nasal congestion   [facility] Malva Limes, MD

## 2019-12-22 DIAGNOSIS — M62830 Muscle spasm of back: Secondary | ICD-10-CM | POA: Diagnosis not present

## 2019-12-22 DIAGNOSIS — M5033 Other cervical disc degeneration, cervicothoracic region: Secondary | ICD-10-CM | POA: Diagnosis not present

## 2019-12-22 DIAGNOSIS — R519 Headache, unspecified: Secondary | ICD-10-CM | POA: Diagnosis not present

## 2019-12-22 DIAGNOSIS — M9901 Segmental and somatic dysfunction of cervical region: Secondary | ICD-10-CM | POA: Diagnosis not present

## 2019-12-29 DIAGNOSIS — M5033 Other cervical disc degeneration, cervicothoracic region: Secondary | ICD-10-CM | POA: Diagnosis not present

## 2019-12-29 DIAGNOSIS — M9901 Segmental and somatic dysfunction of cervical region: Secondary | ICD-10-CM | POA: Diagnosis not present

## 2019-12-29 DIAGNOSIS — R519 Headache, unspecified: Secondary | ICD-10-CM | POA: Diagnosis not present

## 2019-12-29 DIAGNOSIS — M62830 Muscle spasm of back: Secondary | ICD-10-CM | POA: Diagnosis not present

## 2020-01-04 ENCOUNTER — Other Ambulatory Visit: Payer: Self-pay | Admitting: Family Medicine

## 2020-01-04 NOTE — Telephone Encounter (Signed)
Requested medication (s) are due for refill today- yes  Requested medication (s) are on the active medication list -yes  Future visit scheduled -no  Last refill: 10/05/19  Notes to clinic: Fails lab protocol for medication refill- sent for review   Requested Prescriptions  Pending Prescriptions Disp Refills   pravastatin (PRAVACHOL) 80 MG tablet [Pharmacy Med Name: PRAVASTATIN 80MG  TABLETS] 90 tablet 1    Sig: TAKE 1 TABLET(80 MG) BY MOUTH DAILY      Cardiovascular:  Antilipid - Statins Failed - 01/04/2020  3:13 PM      Failed - Total Cholesterol in normal range and within 360 days    Cholesterol, Total  Date Value Ref Range Status  04/24/2017 142 100 - 199 mg/dL Final          Failed - LDL in normal range and within 360 days    LDL Calculated  Date Value Ref Range Status  04/24/2017 70 0 - 99 mg/dL Final          Failed - HDL in normal range and within 360 days    HDL  Date Value Ref Range Status  04/24/2017 57 >39 mg/dL Final          Failed - Triglycerides in normal range and within 360 days    Triglycerides  Date Value Ref Range Status  04/24/2017 77 0 - 149 mg/dL Final          Passed - Patient is not pregnant      Passed - Valid encounter within last 12 months    Recent Outpatient Visits           2 months ago Lytic lesion of bone on x-ray   [facility], MD   2 months ago Frequent headaches   [facility] M, M   6 months ago Allergic rhinitis due to pollen, unspecified seasonality   [place], Adriana M, PA-C   9 months ago Nasal congestion   [facility], MD   1 year ago Nasal congestion   [facility], MD                  Requested Prescriptions  Pending Prescriptions Disp Refills   pravastatin (PRAVACHOL) 80 MG tablet [Pharmacy Med Name: PRAVASTATIN 80MG  TABLETS] 90 tablet 1    Sig: TAKE  1 TABLET(80 MG) BY MOUTH DAILY      Cardiovascular:  Antilipid - Statins Failed - 01/04/2020  3:13 PM      Failed - Total Cholesterol in normal range and within 360 days    Cholesterol, Total  Date Value Ref Range Status  04/24/2017 142 100 - 199 mg/dL Final          Failed - LDL in normal range and within 360 days    LDL Calculated  Date Value Ref Range Status  04/24/2017 70 0 - 99 mg/dL Final          Failed - HDL in normal range and within 360 days    HDL  Date Value Ref Range Status  04/24/2017 57 >39 mg/dL Final          Failed - Triglycerides in normal range and within 360 days    Triglycerides  Date Value Ref Range Status  04/24/2017 77 0 - 149 mg/dL Final          Passed - Patient is not pregnant  Passed - Valid encounter within last 12 months    Recent Outpatient Visits           2 months ago Lytic lesion of bone on x-ray   [facility] Malva Limes, MD   2 months ago Frequent headaches   [facility] Osvaldo Angst M, [place]   6 months ago Allergic rhinitis due to pollen, unspecified seasonality   [facility] Osvaldo Angst M, [place]   9 months ago Nasal congestion   [facility] Malva Limes, MD   1 year ago Nasal congestion   [facility] Malva Limes, MD

## 2020-01-05 DIAGNOSIS — M5033 Other cervical disc degeneration, cervicothoracic region: Secondary | ICD-10-CM | POA: Diagnosis not present

## 2020-01-05 DIAGNOSIS — M9901 Segmental and somatic dysfunction of cervical region: Secondary | ICD-10-CM | POA: Diagnosis not present

## 2020-01-05 DIAGNOSIS — M62830 Muscle spasm of back: Secondary | ICD-10-CM | POA: Diagnosis not present

## 2020-01-05 DIAGNOSIS — R519 Headache, unspecified: Secondary | ICD-10-CM | POA: Diagnosis not present

## 2020-02-02 DIAGNOSIS — R519 Headache, unspecified: Secondary | ICD-10-CM | POA: Diagnosis not present

## 2020-02-02 DIAGNOSIS — M5033 Other cervical disc degeneration, cervicothoracic region: Secondary | ICD-10-CM | POA: Diagnosis not present

## 2020-02-02 DIAGNOSIS — M9901 Segmental and somatic dysfunction of cervical region: Secondary | ICD-10-CM | POA: Diagnosis not present

## 2020-02-02 DIAGNOSIS — M62830 Muscle spasm of back: Secondary | ICD-10-CM | POA: Diagnosis not present

## 2020-03-03 ENCOUNTER — Ambulatory Visit: Payer: Medicare PPO | Admitting: Family Medicine

## 2020-03-03 ENCOUNTER — Encounter: Payer: Self-pay | Admitting: Family Medicine

## 2020-03-03 ENCOUNTER — Other Ambulatory Visit: Payer: Self-pay

## 2020-03-03 VITALS — BP 91/62 | HR 69 | Temp 98.1°F | Resp 18 | Wt 103.0 lb

## 2020-03-03 DIAGNOSIS — I10 Essential (primary) hypertension: Secondary | ICD-10-CM | POA: Diagnosis not present

## 2020-03-03 DIAGNOSIS — M810 Age-related osteoporosis without current pathological fracture: Secondary | ICD-10-CM | POA: Diagnosis not present

## 2020-03-03 DIAGNOSIS — I2510 Atherosclerotic heart disease of native coronary artery without angina pectoris: Secondary | ICD-10-CM | POA: Diagnosis not present

## 2020-03-03 DIAGNOSIS — I6523 Occlusion and stenosis of bilateral carotid arteries: Secondary | ICD-10-CM | POA: Diagnosis not present

## 2020-03-03 DIAGNOSIS — Z23 Encounter for immunization: Secondary | ICD-10-CM | POA: Diagnosis not present

## 2020-03-03 DIAGNOSIS — R519 Headache, unspecified: Secondary | ICD-10-CM

## 2020-03-03 DIAGNOSIS — H6122 Impacted cerumen, left ear: Secondary | ICD-10-CM

## 2020-03-03 DIAGNOSIS — E559 Vitamin D deficiency, unspecified: Secondary | ICD-10-CM

## 2020-03-03 NOTE — Progress Notes (Signed)
I,[name],acting as a scribe for Roger Merry, MD.,have documented all relevant documentation on the behalf of Roger Merry, MD,as directed by  Roger Merry, MD while in the presence of Roger Merry, MD.   Established patient visit   Patient: Roger Gutierrez   DOB: November 29, 1940   79 y.o. Male  MRN: [id number] Visit Date: 03/03/2020  Today's healthcare provider: [name], MD   Chief Complaint  Patient presents with  . Follow-up  . Hypertension  . Headache   Subjective    Otalgia  There is pain in the left ear. This is a new problem. The current episode started in the past 7 days (1 week). The problem occurs every few minutes. The problem has been gradually worsening. There has been no fever. Associated symptoms include headaches. Pertinent negatives include no abdominal pain, coughing, diarrhea, ear discharge, hearing loss, neck pain, rash, rhinorrhea, sore throat or vomiting. He has tried ear drops and NSAIDs (otc ear drops and ibuprofen) for the symptoms. The treatment provided mild relief.    Patient states he has had left ear pain for 1 week. Patient states he has had some mild sinus congestion. No fever, no cough or sore throat. Patient has been treating symptoms with otc ear drops and ibuprofen with mild relief.  Hypertension, follow-up  BP Readings from Last 3 Encounters:  03/03/20 91/62  10/28/19 127/80  10/20/19 108/72   Wt Readings from Last 3 Encounters:  03/03/20 103 lb (46.7 kg)  10/28/19 102 lb (46.3 kg)  10/20/19 102 lb 9.6 oz (46.5 kg)     He was last seen for hypertension.  BP at that visit was. Management since that visit includes; on ramipril. He reports good compliance with treatment. He is not having side effects. none He is not exercising. He is adherent to low salt diet.   Outside blood pressures are not checking.  He does not smoke.  Use of agents associated with hypertension: NSAIDS.    ------------------------------------------------------------------- Diabetes Mellitus Type II, follow-up   --------------------------------------------------------------------------------------------------- Hypertension, follow-up  BP Readings from Last 3 Encounters:  03/03/20 91/62  10/28/19 127/80  10/20/19 108/72   Wt Readings from Last 3 Encounters:  03/03/20 103 lb (46.7 kg)  10/28/19 102 lb (46.3 kg)  10/20/19 102 lb 9.6 oz (46.5 kg)     --------------------------------------------------------------------------------------------------- Lipid/Cholesterol, follow-up  Last Lipid Panel: Lab Results  Component Value Date   CHOL 142 04/24/2017   LDLCALC 70 04/24/2017   HDL 57 04/24/2017   TRIG 77 04/24/2017    He was last seen for this 04/24/2017.  Management since that visit includes; on pravastatin. He reports good compliance with treatment. He is not having side effects. none He is following a Regular, Low Sodium diet. Current exercise: none  Last metabolic panel Lab Results  Component Value Date   GLUCOSE 105 (H) 10/28/2019   NA 135 10/28/2019   K 4.8 10/28/2019   BUN 16 10/28/2019   CREATININE 1.02 10/28/2019   GFRNONAA >60 10/28/2019   GFRAA >60 10/28/2019   CALCIUM 9.5 10/28/2019   AST 18 10/28/2019   ALT 14 10/28/2019   The ASCVD Risk score (Goff DC Jr., et al., 2013) failed to calculate for the following reasons:   The patient has a prior MI or stroke diagnosis  --------------------------------------------------------------------       Medications: Outpatient Medications Prior to Visit  Medication Sig  . albuterol (VENTOLIN HFA) 108 (90 Base) MCG/ACT inhaler INHALE 2 PUFFS INTO THE LUNGS EVERY  6 HOURS AS NEEDED FOR WHEEZING OR SHORTNESS OF BREATH  . aspirin 81 MG tablet Take 1 tablet by mouth daily.  Marland Kitchen ibuprofen (ADVIL,MOTRIN) 200 MG tablet Take 400 mg by mouth as needed.  . pravastatin (PRAVACHOL) 80 MG tablet Take 1 tablet (80 mg total)  by mouth daily.  . ramipril (ALTACE) 5 MG capsule TAKE 1 CAPSULE BY MOUTH EVERY DAY  . ibandronate (BONIVA) 150 MG tablet TAKE 1 TABLET(150 MG) BY MOUTH EVERY 30 DAYS (Patient not taking: Reported on 03/03/2020)  . meloxicam (MOBIC) 15 MG tablet Take 1 tablet (15 mg total) by mouth daily. (Patient not taking: Reported on 03/03/2020)  . Vitamin D, Ergocalciferol, (DRISDOL) 1.25 MG (50000 UT) CAPS capsule TAKE 1 CAPSULE BY MOUTH EVERY 7 DAYS (Patient not taking: Reported on 03/03/2020)   No facility-administered medications prior to visit.    Review of Systems  Constitutional: Negative for appetite change, chills and fever.  HENT: Positive for ear pain. Negative for ear discharge, hearing loss, rhinorrhea and sore throat.   Respiratory: Negative for cough, chest tightness, shortness of breath and wheezing.   Cardiovascular: Negative for chest pain and palpitations.  Gastrointestinal: Negative for abdominal pain, diarrhea, nausea and vomiting.  Musculoskeletal: Negative for neck pain.  Skin: Negative for rash.  Neurological: Positive for headaches.      Objective    BP 91/62 (BP Location: Left Arm, Patient Position: Sitting, Cuff Size: Normal)   Pulse 69   Temp 98.1 F (36.7 C) (Oral)   Resp 18   Wt 103 lb (46.7 kg)   SpO2 98%   BMI 17.14 kg/m    Physical Exam   General Appearance:    Thin male, alert, cooperative, in no acute distress  HENT:   Left ear obstructed with cerumen  Eyes:    PERRL, conjunctiva/corneas clear, EOM's intact       Lungs:     Clear to auscultation bilaterally, respirations unlabored  Heart:    Normal heart rate. Normal rhythm. No murmurs, rubs, or gallops.   Neurologic:   Awake, alert, oriented x 3. No apparent focal neurological           defect.   MS:   Severe kyphosis      Assessment & Plan     1. Benign hypertension Well controlled.  Continue current medications.    2. Osteoporosis, unspecified osteoporosis type, unspecified pathological  fracture presence Severely kyphotic. Has not filled Boniva since [place], but was taking consistently for several years. Advised it is time for BMD. He declined to schedule at this time, but agrees to have it done in 2022.   3. Vitamin D deficiency Currently off of vitamin d supplements. Check levels today.   4. Arteriosclerosis of coronary artery He is tolerating pravastatin well with no adverse effects.    5. Bilateral carotid artery stenosis No HD significant stenosis on most recent carotid ultrasound. He is tolerating pravastatin well with no adverse effects.    6. Frequent headaches Improved.   7. Left ear Cerumen impaction.  After soaking with Debrox, ear canal was irrigated with water until clear. Patient tolerated procedure well.      Flu vaccine administered today     The entirety of the information documented in the History of Present Illness, Review of Systems and Physical Exam were personally obtained by me. Portions of this information were initially documented by the CMA and reviewed by me for thoroughness and accuracy.      Roger Merry, MD  [place] [phone] (phone) [phone] (fax)  [place]

## 2020-03-03 NOTE — Patient Instructions (Signed)
.   Please review the attached list of medications and notify my office if there are any errors.   . Please bring all of your medications to every appointment so we can make sure that our medication list is the same as yours.   

## 2020-03-04 LAB — CBC
Hematocrit: 43.9 % (ref 37.5–51.0)
Hemoglobin: 15 g/dL (ref 13.0–17.7)
MCH: 33 pg (ref 26.6–33.0)
MCHC: 34.2 g/dL (ref 31.5–35.7)
MCV: 97 fL (ref 79–97)
Platelets: 298 10*3/uL (ref 150–450)
RBC: 4.55 x10E6/uL (ref 4.14–5.80)
RDW: 12.2 % (ref 11.6–15.4)
WBC: 6.9 10*3/uL (ref 3.4–10.8)

## 2020-03-04 LAB — COMPREHENSIVE METABOLIC PANEL
ALT: 8 IU/L (ref 0–44)
AST: 12 IU/L (ref 0–40)
Albumin/Globulin Ratio: 2.1 (ref 1.2–2.2)
Albumin: 4 g/dL (ref 3.7–4.7)
Alkaline Phosphatase: 68 IU/L (ref 44–121)
BUN/Creatinine Ratio: 20 (ref 10–24)
BUN: 14 mg/dL (ref 8–27)
Bilirubin Total: 0.4 mg/dL (ref 0.0–1.2)
CO2: 24 mmol/L (ref 20–29)
Calcium: 9 mg/dL (ref 8.6–10.2)
Chloride: 100 mmol/L (ref 96–106)
Creatinine, Ser: 0.69 mg/dL — ABNORMAL LOW (ref 0.76–1.27)
GFR calc Af Amer: 106 mL/min/{1.73_m2} (ref >59)
GFR calc non Af Amer: 92 mL/min/{1.73_m2} (ref >59)
Globulin, Total: 1.9 g/dL (ref 1.5–4.5)
Glucose: 84 mg/dL (ref 65–99)
Potassium: 4.5 mmol/L (ref 3.5–5.2)
Sodium: 138 mmol/L (ref 134–144)
Total Protein: 5.9 g/dL — ABNORMAL LOW (ref 6.0–8.5)

## 2020-03-04 LAB — LIPID PANEL
Chol/HDL Ratio: 3.1 ratio (ref 0.0–5.0)
Cholesterol, Total: 178 mg/dL (ref 100–199)
HDL: 57 mg/dL (ref >39)
LDL Chol Calc (NIH): 106 mg/dL — ABNORMAL HIGH (ref 0–99)
Triglycerides: 83 mg/dL (ref 0–149)
VLDL Cholesterol Cal: 15 mg/dL (ref 5–40)

## 2020-03-04 LAB — VITAMIN D 25 HYDROXY (VIT D DEFICIENCY, FRACTURES): Vit D, 25-Hydroxy: 29.1 ng/mL — ABNORMAL LOW (ref 30.0–100.0)

## 2020-03-05 ENCOUNTER — Other Ambulatory Visit: Payer: Self-pay | Admitting: Family Medicine

## 2020-03-05 MED ORDER — VITAMIN D (ERGOCALCIFEROL) 1.25 MG (50000 UNIT) PO CAPS: ORAL_CAPSULE | ORAL | 4 refills | Status: AC

## 2020-03-06 ENCOUNTER — Telehealth: Payer: Self-pay

## 2020-03-06 DIAGNOSIS — Z1152 Encounter for screening for COVID-19: Secondary | ICD-10-CM

## 2020-03-06 NOTE — Telephone Encounter (Signed)
Copied from CRM [id number]. Topic: Appointment Scheduling - Scheduling Inquiry for Clinic >> Mar 06, 2020  2:18 PM Randol Kern wrote: Reason for CRM: Pt wants to be scheduled for covid testing, he is travelling and has no symptoms. He was just seen in the office on Friday so he DECLINED scheduling a virtual appt to speak with Dr. Sherrie Mustache and DECLINED community testing with [facility].   [phone]

## 2020-03-06 NOTE — Telephone Encounter (Signed)
That fine.

## 2020-03-06 NOTE — Telephone Encounter (Signed)
That's fine. Do we do rapid testing or just PCR. The PCR usually takes two days to come back.

## 2020-03-06 NOTE — Telephone Encounter (Signed)
Advised patient. He does not have a preference on which test. Whichever one you feel is best. He would like to come on Friday to have this done.

## 2020-03-07 ENCOUNTER — Telehealth: Payer: Self-pay

## 2020-03-07 DIAGNOSIS — H6122 Impacted cerumen, left ear: Secondary | ICD-10-CM

## 2020-03-07 NOTE — Telephone Encounter (Signed)
Copied from CRM [id number]. Topic: General - Other >> Mar 07, 2020  2:01 PM Lyn Hollingshead D wrote: PT still have some issues with his ear, was told call back for additional medicine  / please advise

## 2020-03-07 NOTE — Telephone Encounter (Signed)
Roger Gutierrez, are we doing COVID swab collection this Friday since the office is closed that afternoon? Please advise. I advised patient that the there may not be any testing on Friday and he is fine with coming on Monday.

## 2020-03-08 DIAGNOSIS — M5033 Other cervical disc degeneration, cervicothoracic region: Secondary | ICD-10-CM | POA: Diagnosis not present

## 2020-03-08 DIAGNOSIS — M62830 Muscle spasm of back: Secondary | ICD-10-CM | POA: Diagnosis not present

## 2020-03-08 DIAGNOSIS — M9901 Segmental and somatic dysfunction of cervical region: Secondary | ICD-10-CM | POA: Diagnosis not present

## 2020-03-08 DIAGNOSIS — R519 Headache, unspecified: Secondary | ICD-10-CM | POA: Diagnosis not present

## 2020-03-08 MED ORDER — NEOMYCIN-POLYMYXIN-HC 3.5-10000-1 OT SOLN: 3.0000 [drp] | Freq: Four times a day (QID) | OTIC | 0 refills | Status: AC

## 2020-03-08 NOTE — Telephone Encounter (Signed)
Pt. Given message for COVID 19 testing, verbalizes understanding.

## 2020-03-08 NOTE — Telephone Encounter (Signed)
Please advise. Patient was seen in the office on 03/03/2020 for ear pain and Left ear Cerumen impaction. During that visit his ear was irrigated and his ear was clear. Patient is calling back stating that he is still having problems with his ear. Please advise on recommendations.

## 2020-03-08 NOTE — Addendum Note (Signed)
Addended by: Malva Limes on: 03/08/2020 04:32 PM   Modules accepted: Orders

## 2020-03-08 NOTE — Telephone Encounter (Signed)
Order for COVID test placed. This test takes about 2 days for results. Our office is not doing COVID swabs on Friday 03/10/2020. Patient will have to come a different day.  I tried calling patient to advise him of this. Left message to call back. OK for [facility] triage to advise.

## 2020-03-08 NOTE — Telephone Encounter (Signed)
Have sent prescription for some ear drops to [facility].

## 2020-03-09 NOTE — Telephone Encounter (Signed)
Tried calling patient, no answer. Unable to leave a VM. Ok for [facility] to advise that medication was sent into the pharmacy.

## 2020-03-10 NOTE — Telephone Encounter (Signed)
Patient advised.

## 2020-03-13 ENCOUNTER — Telehealth: Payer: Self-pay | Admitting: *Deleted

## 2020-03-13 DIAGNOSIS — Z1152 Encounter for screening for COVID-19: Secondary | ICD-10-CM | POA: Diagnosis not present

## 2020-03-13 NOTE — Telephone Encounter (Signed)
If no respiratory symptoms. Then office evaluation to recheck ear is advised since no improvement. Covid screen and schedule.

## 2020-03-13 NOTE — Telephone Encounter (Signed)
Please advise. KW 

## 2020-03-13 NOTE — Telephone Encounter (Signed)
Copied from CRM [id number]. Topic: General - Other >> Mar 13, 2020  8:50 AM Jaquita Rector A wrote: Reason for CRM: Patient called in to inform Dr Sherrie Mustache that the ear drops prescribed are not working neomycin-polymyxin-hydrocortisone (CORTISPORIN) OTIC solution. Asking if there is something else that will help him states its been about 6 days on these drops with no change. Please advise  Ph# [phone]

## 2020-03-14 ENCOUNTER — Telehealth: Payer: Self-pay

## 2020-03-14 NOTE — Telephone Encounter (Signed)
Unable to reach patient will try to reach out to patient again a a later time. KW

## 2020-03-14 NOTE — Telephone Encounter (Signed)
Roshena, do you have any information about this.  It looks like it was ordered on 03/08/20 but I can't tell taht it was done Thanks    Copied from CRM [id number]. Topic: General - Other >> Mar 14, 2020  8:09 AM Lyn Hollingshead D wrote: PT need to speak with a nurse about his covid test / he was told the results be ready the same day / please advise

## 2020-03-14 NOTE — Telephone Encounter (Signed)
Copied from CRM #[id number]. Topic: General - Other °>> Mar 14, 2020 11:59 AM Harris, Brenda J wrote: °Reason for CRM: Patient called to ask the nurse to call him with a date when he could expect his covid results.  He said he needed it to plan a trip to [place].  Tried the office but it was closed for lunch.  Please call patient to discuss at [phone] ( °

## 2020-03-14 NOTE — Telephone Encounter (Signed)
Patient advised and verbalized understanding. Patient declined an appointment. He says he is leaving for [place] first thing in the morning and he wont have time to come in for an appointment. Patient will be in [place] for 3 weeks.

## 2020-03-14 NOTE — Telephone Encounter (Signed)
I called and spoke with patient. I advised him of the estimated turnaround time for COVID test, which is 2-4 days. Patient verbalized understanding. Patient told me that he called the airport and he doesn't not need proof of a negative COVID test since he is not doing international travel. Patient will be leaving for the airport in the morning and will not be available to answer phone calls since he doesn't have a cell phone.

## 2020-03-14 NOTE — Telephone Encounter (Deleted)
Copied from CRM [id number]. Topic: General - Other >> Mar 14, 2020 11:59 AM Tamela Oddi wrote: Reason for CRM: Patient called to ask the nurse to call him with a date when he could expect his covid results.  He said he needed it to plan a trip to [place].  Tried the office but it was closed for lunch.  Please call patient to discuss at [phone] (

## 2020-03-14 NOTE — Telephone Encounter (Signed)
Pt states Dr Sherrie Mustache told him he would get a copy of his covid test today. Pt states he was supposed to get a copy of his results yesterday.  Pt states he has a flight to [place], and if he doesn't get results by today, he will have to cancel his flight.

## 2020-03-15 LAB — SARS-COV-2, NAA 2 DAY TAT

## 2020-03-15 LAB — NOVEL CORONAVIRUS, NAA: SARS-CoV-2, NAA: NOT DETECTED

## 2020-04-06 ENCOUNTER — Telehealth: Payer: Self-pay | Admitting: Family Medicine

## 2020-04-06 NOTE — Telephone Encounter (Signed)
Copied from CRM [id number]. Topic: Medicare AWV >> Apr 06, 2020  1:26 PM Claudette Laws R wrote: Reason for CRM:   No answer unable to leave message for patient to call back and schedule Medicare Annual Wellness Visit (AWV) in office.   If not able to come in office, please offer to do virtually.   Last AWV  04/28/2018  Please schedule at anytime with [facility] Advisor.  If any questions, please contact me at [phone]

## 2020-04-24 ENCOUNTER — Telehealth: Payer: Self-pay | Admitting: Family Medicine

## 2020-04-24 MED ORDER — CEPHALEXIN 500 MG PO CAPS
500.0000 mg | ORAL_CAPSULE | Freq: Three times a day (TID) | ORAL | 0 refills | Status: DC
Start: 2020-04-24 — End: 2020-05-09

## 2020-04-24 NOTE — Telephone Encounter (Signed)
Will send in prescription for cephalexin. If not better when finished will need referral to ENT.

## 2020-04-24 NOTE — Telephone Encounter (Signed)
Patient is calling because he is still having Left Ear pain - patient was prescribed amoxicillin (AMOXIL) 500 MG capsule [[id number]] ENDED On 04/05/20. All of the the medication is completed. Patient would like to know how would Dr. Sherrie Mustache like to proceed next regarding the ear Pain. Preferred Pharmacy- [facility]- [phone]

## 2020-04-24 NOTE — Telephone Encounter (Signed)
Advised patient as below.  

## 2020-05-02 DIAGNOSIS — M62830 Muscle spasm of back: Secondary | ICD-10-CM | POA: Diagnosis not present

## 2020-05-02 DIAGNOSIS — M5412 Radiculopathy, cervical region: Secondary | ICD-10-CM | POA: Diagnosis not present

## 2020-05-02 DIAGNOSIS — M9901 Segmental and somatic dysfunction of cervical region: Secondary | ICD-10-CM | POA: Diagnosis not present

## 2020-05-02 DIAGNOSIS — M5033 Other cervical disc degeneration, cervicothoracic region: Secondary | ICD-10-CM | POA: Diagnosis not present

## 2020-05-03 ENCOUNTER — Telehealth: Payer: Self-pay | Admitting: Family Medicine

## 2020-05-03 NOTE — Telephone Encounter (Signed)
Copied from CRM [id number]. Topic: Medicare AWV >> May 03, 2020  1:23 PM Claudette Laws R wrote: Reason for CRM:  Left message for patient to call back and schedule Medicare Annual Wellness Visit (AWV) in office.   If not able to come in office, please offer to do virtually or by telephone.   Last AWV 04/28/2018  Please schedule at anytime with [facility] Health Advisor.  If any questions, please contact me at [phone]

## 2020-05-09 ENCOUNTER — Telehealth: Payer: Self-pay | Admitting: Family Medicine

## 2020-05-09 MED ORDER — CEPHALEXIN 500 MG PO CAPS: 500.0000 mg | ORAL_CAPSULE | Freq: Three times a day (TID) | ORAL | 0 refills | Status: AC

## 2020-05-09 NOTE — Telephone Encounter (Addendum)
Medication: cephALEXin (KEFLEX) 500 MG capsule  Has the pt contacted their pharmacy? No this is not a regular medication  Preferred pharmacy: [id number] [place], [place] - [address]   Pt is still have pain in his left ear.  Dr Sherrie Mustache called this medication in for him on 1/31.  Pt states the pain is better, but has not gone away.  He states Dr Sherrie Mustache told him he may refer to ENT if pain persist.  Pt would like to refill the medication again to see if it gets better. pt declined appt until he hears from Dr Sherrie Mustache

## 2020-07-14 ENCOUNTER — Telehealth: Payer: Self-pay | Admitting: Family Medicine

## 2020-07-14 MED ORDER — CYCLOBENZAPRINE HCL 5 MG PO TABS
5.0000 mg | ORAL_TABLET | Freq: Three times a day (TID) | ORAL | 1 refills | Status: AC | PRN
Start: 2020-07-14 — End: ?

## 2020-07-14 NOTE — Telephone Encounter (Signed)
Pt stated he called yesterday but his call has not been returned. Pt requests that Dr. Sherrie Mustache return his call regarding pain medication for his back.

## 2020-07-14 NOTE — Telephone Encounter (Signed)
Pt states he pulled a muscle picking up something out of the car and he heard "pop". Pt states it happened Wed and now today he cannot walk. Pt states the pain shoots down from his lower back to his knee cap Pt would like Dr Sherrie Mustache to prescribe something for him. Pt declined to make appt, stating "there is nothing to see,  I have hurt my back!"  Pt asked that I send a message for request of medication.  [id number] - [place], [place] - [address]

## 2020-07-14 NOTE — Telephone Encounter (Signed)
Cyclobenzaprine prescription was sent to pharmacy earlier this afternoon

## 2020-07-14 NOTE — Telephone Encounter (Signed)
Please advise 

## 2020-07-15 ENCOUNTER — Other Ambulatory Visit: Payer: Self-pay

## 2020-07-15 ENCOUNTER — Emergency Department: Payer: Medicare PPO

## 2020-07-15 ENCOUNTER — Emergency Department
Admission: EM | Admit: 2020-07-15 | Discharge: 2020-07-15 | Disposition: A | Discharge status: Home / Self Care | Payer: Medicare PPO | Attending: Emergency Medicine | Admitting: Emergency Medicine

## 2020-07-15 ENCOUNTER — Encounter: Payer: Self-pay | Admitting: Emergency Medicine

## 2020-07-15 DIAGNOSIS — M48061 Spinal stenosis, lumbar region without neurogenic claudication: Secondary | ICD-10-CM

## 2020-07-15 DIAGNOSIS — Z79899 Other long term (current) drug therapy: Secondary | ICD-10-CM | POA: Insufficient documentation

## 2020-07-15 DIAGNOSIS — M48062 Spinal stenosis, lumbar region with neurogenic claudication: Secondary | ICD-10-CM | POA: Insufficient documentation

## 2020-07-15 DIAGNOSIS — I2510 Atherosclerotic heart disease of native coronary artery without angina pectoris: Secondary | ICD-10-CM | POA: Diagnosis not present

## 2020-07-15 DIAGNOSIS — M545 Low back pain, unspecified: Secondary | ICD-10-CM

## 2020-07-15 DIAGNOSIS — J449 Chronic obstructive pulmonary disease, unspecified: Secondary | ICD-10-CM | POA: Diagnosis not present

## 2020-07-15 DIAGNOSIS — X509XXA Other and unspecified overexertion or strenuous movements or postures, initial encounter: Secondary | ICD-10-CM | POA: Insufficient documentation

## 2020-07-15 DIAGNOSIS — I10 Essential (primary) hypertension: Secondary | ICD-10-CM | POA: Diagnosis not present

## 2020-07-15 DIAGNOSIS — F17290 Nicotine dependence, other tobacco product, uncomplicated: Secondary | ICD-10-CM | POA: Insufficient documentation

## 2020-07-15 DIAGNOSIS — Z7982 Long term (current) use of aspirin: Secondary | ICD-10-CM | POA: Insufficient documentation

## 2020-07-15 DIAGNOSIS — M5459 Other low back pain: Secondary | ICD-10-CM | POA: Diagnosis not present

## 2020-07-15 MED ORDER — FENTANYL CITRATE (PF) 100 MCG/2ML IJ SOLN
50.0000 ug | Freq: Once | INTRAMUSCULAR | Status: AC
  Administered 2020-07-15: 50 ug via INTRAMUSCULAR
  Filled 2020-07-15: qty 2

## 2020-07-15 MED ORDER — FENTANYL CITRATE (PF) 100 MCG/2ML IJ SOLN: 50.0000 ug | Freq: Once | INTRAMUSCULAR | Status: DC

## 2020-07-15 MED ORDER — OXYCODONE-ACETAMINOPHEN 5-325 MG PO TABS: 1.0000 | ORAL_TABLET | Freq: Four times a day (QID) | ORAL | 0 refills | Status: DC | PRN

## 2020-07-15 MED ORDER — OXYCODONE-ACETAMINOPHEN 5-325 MG PO TABS
1.0000 | ORAL_TABLET | Freq: Once | ORAL | Status: AC
  Administered 2020-07-15: 1 via ORAL
  Filled 2020-07-15: qty 1

## 2020-07-15 NOTE — ED Provider Notes (Signed)
[facility] Emergency Department Provider Note  ____________________________________________   Event Date/Time   First MD Initiated Contact with Patient 07/15/20 [phone]     (approximate)  I have reviewed the triage vital signs and the nursing notes.   HISTORY  Chief Complaint Back Pain   HPI Roger Gutierrez is a 80 y.o. male presents to the ED with complaint of low back pain that started 2 days ago.  Patient states he was lifting something heavy out of his car.  He states that he bent over and strained his back.  He states that he lifted with his back rather than ending at his knees.  He has chronic issues with his back.  He denies any incontinence of bowel or bladder and has continued to ambulate.  He did call his PCP yesterday who prescribed ibuprofen and Flexeril 5 mg 3 times daily which he states is not helping with his pain.  Currently rates his pain as a 10/10.         Past Medical History:  Diagnosis Date  . Allergy   . Cataract    removed  . COPD (chronic obstructive pulmonary disease) (HCC)   . Hypertension   . Myocardial infarction (HCC)   . Osteoporosis     Patient Active Problem List   Diagnosis Date Noted  . Lytic lesion of bone 11/01/2019  . Frequent headaches 11/01/2019  . Bilateral carotid artery stenosis 12/16/2016  . Inferior myocardial infarction (HCC) 12/16/2016  . Depression 10/29/2016  . Insomnia 10/29/2016  . Vitamin D deficiency 03/29/2016  . Cigar smoker 01/13/2015  . Arteriosclerosis of coronary artery 12/16/2014  . Anorexia 12/15/2014  . Long term current use of systemic steroids 12/15/2014  . CAFL (chronic airflow limitation) (HCC) 12/15/2014  . Failure of erection 12/15/2014  . Hypercholesteremia 12/15/2014  . OP (osteoporosis) 12/15/2014  . Ischemic cardiomyopathy 06/03/2014  . Abnormal loss of weight 04/03/2009  . H/O acute poliomyelitis 12/20/2007  . Scoliosis 12/20/2007  . Benign hypertension 07/09/2007     Past Surgical History:  Procedure Laterality Date  . ANGIOPLASTY     cardiac; 3 stents  . ankel fusion Left 1958  . EYE SURGERY Bilateral 08/2011 and 09/2011   cataract extraction  . TONSILLECTOMY AND ADENOIDECTOMY  1950's    Prior to Admission medications   Medication Sig Start Date End Date Taking? Authorizing Provider  oxyCODONE-acetaminophen (PERCOCET) 5-325 MG tablet Take 1 tablet by mouth every 6 (six) hours as needed for severe pain. 07/15/20 07/15/21 Yes Shevette Bess L, PA-C  albuterol (VENTOLIN HFA) 108 (90 Base) MCG/ACT inhaler INHALE 2 PUFFS INTO THE LUNGS EVERY 6 HOURS AS NEEDED FOR WHEEZING OR SHORTNESS OF BREATH 08/11/19   Malva Limes, MD  aspirin 81 MG tablet Take 1 tablet by mouth daily.    [provider]  cyclobenzaprine (FLEXERIL) 5 MG tablet Take 1 tablet (5 mg total) by mouth 3 (three) times daily as needed for muscle spasms. 07/14/20   Malva Limes, MD  ibandronate (BONIVA) 150 MG tablet TAKE 1 TABLET(150 MG) BY MOUTH EVERY 30 DAYS Patient not taking: Reported on 03/03/2020 07/30/18   Malva Limes, MD  ibuprofen (ADVIL,MOTRIN) 200 MG tablet Take 400 mg by mouth as needed.    [provider]  pravastatin (PRAVACHOL) 80 MG tablet Take 1 tablet (80 mg total) by mouth daily. 07/06/19   Malva Limes, MD  ramipril (ALTACE) 5 MG capsule TAKE 1 CAPSULE BY MOUTH EVERY DAY 05/23/19  Malva Limes, MD  Vitamin D, Ergocalciferol, (DRISDOL) 1.25 MG (50000 UNIT) CAPS capsule TAKE 1 CAPSULE BY MOUTH EVERY 7 DAYS 03/05/20   Malva Limes, MD    Allergies Megace  [megestrol]  Family History  Problem Relation Age of Onset  . Hypertension Mother   . Healthy Sister     Social History Social History   Tobacco Use  . Smoking status: Current Some Day Smoker    Types: Cigars    Last attempt to quit: 03/25/2003    Years since quitting: 17.3  . Smokeless tobacco: Never Used  . Tobacco comment: QUIT CIGARETTES 2004 only smoked 4-5 years.    Vaping Use  . Vaping Use: Never used  Substance Use Topics  . Alcohol use: Yes    Comment: occasionally beer 1-2/monthly  . Drug use: No    Review of Systems Constitutional: No fever/chills Eyes: No visual changes. Cardiovascular: Denies chest pain. Respiratory: Denies shortness of breath. Gastrointestinal: No abdominal pain.  No nausea, no vomiting. Genitourinary: Negative for dysuria. Musculoskeletal: Positive for low back pain. Skin: Negative for rash. Neurological: Negative for headaches, focal weakness or numbness.  ____________________________________________   PHYSICAL EXAM:  VITAL SIGNS: ED Triage Vitals  Enc Vitals Group     BP 07/15/20 0946 129/81     Pulse Rate 07/15/20 0946 90     Resp 07/15/20 0946 20     Temp 07/15/20 0946 98 F (36.7 C)     Temp Source 07/15/20 0946 Oral     SpO2 07/15/20 0946 96 %     Weight 07/15/20 0940 105 lb (47.6 kg)     Height 07/15/20 0940 5\' 5"  (1.651 m)     Head Circumference --      Peak Flow --      Pain Score 07/15/20 0939 10     Pain Loc --      Pain Edu? --      Excl. in GC? --    Constitutional: Alert and oriented. Well appearing and in no acute distress. Eyes: Conjunctivae are normal.  Head: Atraumatic. Neck: No stridor.  No cervical tenderness on palpation posteriorly. Cardiovascular: Normal rate, regular rhythm. Grossly normal heart sounds.  Good peripheral circulation. Respiratory: Normal respiratory effort.  No retractions. Lungs CTAB. Gastrointestinal: Soft and nontender. No distention.  Musculoskeletal: Examination of the back there is a chronic levoscoliosis deformity which is nontender to palpation.  Tenderness area has no discoloration.  On L5-S1 and sacral area there is moderate tenderness palpation.  No soft tissue edema or discoloration present.  Patient has good muscle strength bilaterally.  Gait was not tested during this exam. Neurologic:  Normal speech and language. No gross focal neurologic deficits  are appreciated.  Skin:  Skin is warm, dry and intact.  Psychiatric: Mood and affect are normal. Speech and behavior are normal.  ____________________________________________   LABS (all labs ordered are listed, but only abnormal results are displayed)  Labs Reviewed - No data to display ____________________________________________   RADIOLOGY I, 07/17/20, personally viewed and evaluated these images (plain radiographs) as part of my medical decision making, as well as reviewing the written report by the radiologist.   Official radiology report(s): DG Lumbar Spine 2-3 Views  Result Date: 07/15/2020 CLINICAL DATA:  Pain after lifting injury 2 days ago. Pain radiates to the left side. EXAM: LUMBAR SPINE - 2-3 VIEW COMPARISON:  None. FINDINGS: No visible fracture, erosion, or bone lesion. Marked lumbar levoscoliosis. Generalized osteopenia. Prominent arterial  calcification of the aorta. IMPRESSION: No acute finding, although sensitivity is degraded by osteopenia and marked levoscoliosis Electronically Signed   By: Marnee SpringJonathon  Watts M.D.   On: 07/15/2020 11:05   CT Lumbar Spine Wo Contrast  Result Date: 07/15/2020 CLINICAL DATA:  Low back pain.  Increased fracture risk. EXAM: CT LUMBAR SPINE WITHOUT CONTRAST TECHNIQUE: Multidetector CT imaging of the lumbar spine was performed without intravenous contrast administration. Multiplanar CT image reconstructions were also generated. COMPARISON:  Lumbar spine radiographs 07/15/2020. CT abdomen and pelvis 04/12/2009. FINDINGS: Segmentation: 5 lumbar type vertebrae. Alignment: Severe levoscoliosis with apex at L3. No significant listhesis in the sagittal plane. Vertebrae: Diffuse osteopenia. No fracture or destructive osseous process. Subcentimeter sclerotic focus in the L3 vertebral body, also present in 2011 and likely reflecting a bone island. Paraspinal and other soft tissues: Extensive abdominal aortic atherosclerosis without aneurysm.  Asymmetric right-sided paraspinal muscle atrophy. Disc levels: Advanced asymmetric right-sided disc space narrowing at L3-4 with endplate and facet spurring resulting in moderate right neural foraminal stenosis. Moderate right neural foraminal stenosis at L4-5 and mild-to-moderate left neural foraminal stenosis at L5-S1 due to disc bulging and facet spurring. No evidence of significant lumbar spinal canal stenosis. IMPRESSION: 1. No evidence of acute osseous abnormality. 2. Severe lumbar levoscoliosis. 3. Moderate right neural foraminal stenosis at L3-4 and L4-5. No evidence of significant spinal stenosis. 4. Aortic Atherosclerosis (ICD10-I70.0). Electronically Signed   By: Sebastian AcheAllen  Grady M.D.   On: 07/15/2020 12:36    ____________________________________________   PROCEDURES  Procedure(s) performed (including Critical Care):  Procedures   ____________________________________________   INITIAL IMPRESSION / ASSESSMENT AND PLAN / ED COURSE  As part of my medical decision making, I reviewed the following data within the electronic MEDICAL RECORD NUMBER Notes from prior ED visits and [place] Controlled Substance Database  80 year old male presents to the ED with complaint of low back pain after lifting an item out of his car 2 days ago.  Patient has continued to have low back pain since that time and his PCP called in Flexeril 5 mg to take every 8 hours which he states is not helping along with ibuprofen.  Patient has chronic levoscoliosis which is severe.  There is moderate tenderness on palpation of the lower lumbar and sacral area.  Plain films were difficult to read and completely assess secondary to his osteopenia.  CT scan was ordered for further evaluation and reported negative for any acute fracture however there was spinal stenosis noted L4-L5 and L5-S1.  Patient was given Percocet while in the ED for pain.  Patient reported this gave him no relief after 1 hour and fentanyl  Mcg was given IM.  Patient  states that this helped a great deal with his pain.  He was still discharged with a prescription for Percocet 5 mg every 6 hours as needed for pain along with Flexeril that his PCP has already sent to the pharmacy.  Patient is aware that if he takes the 2 medications together this will cause potentially moderate drowsiness which may increase his chances for falling.  Patient and family are aware and patient states that he does not live alone.  He is to follow-up with his PCP if any continued problems and also a referral to Dr. Adriana Simasook for neurosurgery consult of his spinal stenosis.  ____________________________________________   FINAL CLINICAL IMPRESSION(S) / ED DIAGNOSES  Final diagnoses:  Acute left-sided low back pain without sciatica  Spinal stenosis of lumbar region, unspecified whether neurogenic claudication present  ED Discharge Orders         Ordered    oxyCODONE-acetaminophen (PERCOCET) 5-325 MG tablet  Every 6 hours PRN        07/15/20 1255          *Please note:  MAZE CORNIEL was evaluated in Emergency Department on 07/15/2020 for the symptoms described in the history of present illness. He was evaluated in the context of the global COVID-19 pandemic, which necessitated consideration that the patient might be at risk for infection with the SARS-CoV-2 virus that causes COVID-19. Institutional protocols and algorithms that pertain to the evaluation of patients at risk for COVID-19 are in a state of rapid change based on information released by regulatory bodies including the CDC and federal and state organizations. These policies and algorithms were followed during the patient's care in the ED.  Some ED evaluations and interventions may be delayed as a result of limited staffing during and the pandemic.*   Note:  This document was prepared using Dragon voice recognition software and may include unintentional dictation errors.    Tommi Rumps, PA-C 07/15/20 1603     Delton Pra[place], MD 07/16/20 [phone]

## 2020-07-15 NOTE — ED Notes (Signed)
Pt states pain still present and requests more meds. PA aware.

## 2020-07-15 NOTE — ED Notes (Signed)
Pt states lower back pain to knees with hx of sciatica. NO relief with pain meds.

## 2020-07-15 NOTE — Discharge Instructions (Addendum)
Call make an appointment with Dr. Adriana Simas his contact information is listed on your discharge papers.  Continue taking the Flexeril that was prescribed by your provider.  A prescription for pain medication was sent to your pharmacy.  Take this only as directed and be aware that this could cause drowsiness and increase your risk for injury.  You may use ice or heat to your back as needed for discomfort.  Turn to the emergency department immediately if any severe worsening of your symptoms such as loss of bowel or bladder control.

## 2020-07-15 NOTE — ED Notes (Signed)
Pt taken to xray 

## 2020-07-15 NOTE — ED Triage Notes (Signed)
Pt reports 2 days ago he was lifting something heavy out of the car and he bent over and strained his back. Pt c/o pain to lower back

## 2020-07-18 ENCOUNTER — Telehealth: Payer: Self-pay

## 2020-07-18 MED ORDER — OXYCODONE-ACETAMINOPHEN 7.5-325 MG PO TABS: 1.0000 | ORAL_TABLET | ORAL | 0 refills | Status: DC | PRN

## 2020-07-18 NOTE — Addendum Note (Signed)
Addended by: Malva Limes on: 07/18/2020 04:26 PM   Modules accepted: Orders

## 2020-07-18 NOTE — Telephone Encounter (Signed)
Copied from CRM [id number]. Topic: General - Other >> Jul 18, 2020  2:30 PM Aretta Nip wrote:  Pt states was jjust in ER [facility] past wk and was prescribed oxyCODONE-acetaminophen (PERCOCET) 5-325 MG tablet 20 tablet 0 07/15/2020 07/15/2021  Sig - Route: Take 1 tablet by mouth every 6 (six) hours as needed for severe pain. - Oral  Sent to pharmacy as: oxyCODONE-acetaminophen (PERCOCET) 5-325 MG tablet  Earliest Fill Date: 07/15/2020  Pt is now on the last pill and is wanting Dr Sherrie Mustache to prescribe a stronger dose as this is barely touching his pain. Pt was told  the last in office was in Dec for an increase that an appt maybe required, pt says he is in so much pain that it is no way he could get to office. Pt wants the refill sent to       [id number] [place], [place] - [address] AT [facility] OF [address] [place] [address] Phone: [phone] Fax: [phone] Pt states that if Dr Sherrie Mustache has any issues with this he would like a call back at [phone]

## 2020-07-19 DIAGNOSIS — M5136 Other intervertebral disc degeneration, lumbar region: Secondary | ICD-10-CM | POA: Diagnosis not present

## 2020-07-19 DIAGNOSIS — M9902 Segmental and somatic dysfunction of thoracic region: Secondary | ICD-10-CM | POA: Diagnosis not present

## 2020-07-19 DIAGNOSIS — M62830 Muscle spasm of back: Secondary | ICD-10-CM | POA: Diagnosis not present

## 2020-07-19 DIAGNOSIS — M9903 Segmental and somatic dysfunction of lumbar region: Secondary | ICD-10-CM | POA: Diagnosis not present

## 2020-07-20 DIAGNOSIS — M9902 Segmental and somatic dysfunction of thoracic region: Secondary | ICD-10-CM | POA: Diagnosis not present

## 2020-07-20 DIAGNOSIS — M5136 Other intervertebral disc degeneration, lumbar region: Secondary | ICD-10-CM | POA: Diagnosis not present

## 2020-07-20 DIAGNOSIS — M9903 Segmental and somatic dysfunction of lumbar region: Secondary | ICD-10-CM | POA: Diagnosis not present

## 2020-07-20 DIAGNOSIS — M62830 Muscle spasm of back: Secondary | ICD-10-CM | POA: Diagnosis not present

## 2020-07-21 DIAGNOSIS — M9903 Segmental and somatic dysfunction of lumbar region: Secondary | ICD-10-CM | POA: Diagnosis not present

## 2020-07-21 DIAGNOSIS — M62830 Muscle spasm of back: Secondary | ICD-10-CM | POA: Diagnosis not present

## 2020-07-21 DIAGNOSIS — M5136 Other intervertebral disc degeneration, lumbar region: Secondary | ICD-10-CM | POA: Diagnosis not present

## 2020-07-21 DIAGNOSIS — M9902 Segmental and somatic dysfunction of thoracic region: Secondary | ICD-10-CM | POA: Diagnosis not present

## 2020-07-24 DIAGNOSIS — M9903 Segmental and somatic dysfunction of lumbar region: Secondary | ICD-10-CM | POA: Diagnosis not present

## 2020-07-24 DIAGNOSIS — M9902 Segmental and somatic dysfunction of thoracic region: Secondary | ICD-10-CM | POA: Diagnosis not present

## 2020-07-24 DIAGNOSIS — M5136 Other intervertebral disc degeneration, lumbar region: Secondary | ICD-10-CM | POA: Diagnosis not present

## 2020-07-24 DIAGNOSIS — M62830 Muscle spasm of back: Secondary | ICD-10-CM | POA: Diagnosis not present

## 2020-07-25 ENCOUNTER — Encounter: Payer: Self-pay | Admitting: Family Medicine

## 2020-07-26 ENCOUNTER — Telehealth: Payer: Self-pay | Admitting: *Deleted

## 2020-07-26 NOTE — Telephone Encounter (Signed)
Copied from CRM [id number]. Topic: General - Other >> Jul 26, 2020 11:51 AM Pawlus, Maxine Glenn A wrote: Reason for CRM: Rogala was following up on the messages she sent yesterday regarding the Pts health, please call back.

## 2020-07-26 NOTE — Telephone Encounter (Signed)
Please advise 

## 2020-07-26 NOTE — Telephone Encounter (Addendum)
I called and spoke with patient's daughter Gassman to let her know that her mychart messages have been sent to Dr. Sherrie Mustache for review. Lupinacci advised me that she tried scheduling an appointment for patient to see Dr. Sherrie Mustache, but the soonest appointment wasn't until 08/08/2020. Crist states that she feels that Mr. Goates needs to be seen sooner than that. She states " I feel like he needs IV fluids because he is not drinking any water". Sarver states that patient refuses to drink plain water. He drinks about 10-15 cups of coffee every day, one 8oz bottle of tea and a V8 juice. Per Gunnar Fusi, patient is immobile and refuses to go to the ER for evaluation. Tavella states that if we were to call and ask the patient about this, he would say that he drinks plenty of fluids and everything is fine. Kelly wants Dr. Theodis Aguas opinion on what she should do. She also wants to know if we can work patient in to be seen sooner. Esquivias asks that Dr. Sherrie Mustache send his response via mychart message because that is a more convenient way for her to communicate. I don't see that [place] is listed on patient's DPR.  Please advise.

## 2020-07-27 ENCOUNTER — Telehealth: Payer: Self-pay | Admitting: Family Medicine

## 2020-07-27 MED ORDER — DULOXETINE HCL 30 MG PO CPEP
30.0000 mg | ORAL_CAPSULE | Freq: Every day | ORAL | 1 refills | Status: AC
Start: 2020-07-27 — End: ?

## 2020-07-27 MED ORDER — OXYCODONE-ACETAMINOPHEN 7.5-325 MG PO TABS: 1.0000 | ORAL_TABLET | ORAL | 0 refills | Status: AC | PRN

## 2020-07-27 NOTE — Telephone Encounter (Signed)
Medication Refill - Medication: oxycodone 7.5-325 mg  Has the patient contacted their pharmacy? No controlled substance ( Preferred Pharmacy (with phone number or street name): [facility] [address]  in [place] phone number [phone]  Agent: Please be advised that RX refills may take up to 3 business days. We ask that you follow-up with your pharmacy.

## 2020-07-27 NOTE — Telephone Encounter (Signed)
Medication requested is not on current list  

## 2020-07-27 NOTE — Telephone Encounter (Signed)
Please advise on refill request. Patient was prescribed this mediction on 07/18/2020, but the medication is no longer on his active medication list. It looks like the medication automatically dropped off because it had an end date of 07/23/2020.

## 2020-07-28 ENCOUNTER — Emergency Department: Payer: Medicare PPO

## 2020-07-28 ENCOUNTER — Other Ambulatory Visit: Payer: Self-pay

## 2020-07-28 ENCOUNTER — Other Ambulatory Visit: Payer: Self-pay | Admitting: Family Medicine

## 2020-07-28 ENCOUNTER — Inpatient Hospital Stay
Admission: EM | Admit: 2020-07-28 | Discharge: 2020-08-23 | DRG: 291 | Disposition: E | Payer: Medicare PPO | Attending: Internal Medicine | Admitting: Internal Medicine

## 2020-07-28 DIAGNOSIS — Z955 Presence of coronary angioplasty implant and graft: Secondary | ICD-10-CM

## 2020-07-28 DIAGNOSIS — Z681 Body mass index (BMI) 19 or less, adult: Secondary | ICD-10-CM | POA: Diagnosis not present

## 2020-07-28 DIAGNOSIS — I5031 Acute diastolic (congestive) heart failure: Secondary | ICD-10-CM | POA: Diagnosis not present

## 2020-07-28 DIAGNOSIS — I5043 Acute on chronic combined systolic (congestive) and diastolic (congestive) heart failure: Secondary | ICD-10-CM

## 2020-07-28 DIAGNOSIS — R627 Adult failure to thrive: Secondary | ICD-10-CM | POA: Diagnosis present

## 2020-07-28 DIAGNOSIS — J9621 Acute and chronic respiratory failure with hypoxia: Secondary | ICD-10-CM | POA: Diagnosis present

## 2020-07-28 DIAGNOSIS — I509 Heart failure, unspecified: Secondary | ICD-10-CM | POA: Diagnosis not present

## 2020-07-28 DIAGNOSIS — A419 Sepsis, unspecified organism: Secondary | ICD-10-CM | POA: Diagnosis not present

## 2020-07-28 DIAGNOSIS — M810 Age-related osteoporosis without current pathological fracture: Secondary | ICD-10-CM | POA: Diagnosis present

## 2020-07-28 DIAGNOSIS — Z515 Encounter for palliative care: Secondary | ICD-10-CM

## 2020-07-28 DIAGNOSIS — I5033 Acute on chronic diastolic (congestive) heart failure: Secondary | ICD-10-CM | POA: Diagnosis present

## 2020-07-28 DIAGNOSIS — Z66 Do not resuscitate: Secondary | ICD-10-CM | POA: Diagnosis not present

## 2020-07-28 DIAGNOSIS — S32050A Wedge compression fracture of fifth lumbar vertebra, initial encounter for closed fracture: Secondary | ICD-10-CM | POA: Diagnosis not present

## 2020-07-28 DIAGNOSIS — M545 Low back pain, unspecified: Secondary | ICD-10-CM

## 2020-07-28 DIAGNOSIS — M48061 Spinal stenosis, lumbar region without neurogenic claudication: Secondary | ICD-10-CM | POA: Diagnosis present

## 2020-07-28 DIAGNOSIS — R54 Age-related physical debility: Secondary | ICD-10-CM | POA: Diagnosis present

## 2020-07-28 DIAGNOSIS — M419 Scoliosis, unspecified: Secondary | ICD-10-CM | POA: Diagnosis present

## 2020-07-28 DIAGNOSIS — I700 Atherosclerosis of aorta: Secondary | ICD-10-CM | POA: Diagnosis not present

## 2020-07-28 DIAGNOSIS — R6520 Severe sepsis without septic shock: Secondary | ICD-10-CM | POA: Diagnosis not present

## 2020-07-28 DIAGNOSIS — I10 Essential (primary) hypertension: Secondary | ICD-10-CM | POA: Diagnosis not present

## 2020-07-28 DIAGNOSIS — I25118 Atherosclerotic heart disease of native coronary artery with other forms of angina pectoris: Secondary | ICD-10-CM

## 2020-07-28 DIAGNOSIS — E8809 Other disorders of plasma-protein metabolism, not elsewhere classified: Secondary | ICD-10-CM | POA: Diagnosis present

## 2020-07-28 DIAGNOSIS — R531 Weakness: Secondary | ICD-10-CM | POA: Diagnosis not present

## 2020-07-28 DIAGNOSIS — R64 Cachexia: Secondary | ICD-10-CM | POA: Diagnosis present

## 2020-07-28 DIAGNOSIS — N179 Acute kidney failure, unspecified: Secondary | ICD-10-CM | POA: Diagnosis not present

## 2020-07-28 DIAGNOSIS — F17290 Nicotine dependence, other tobacco product, uncomplicated: Secondary | ICD-10-CM | POA: Diagnosis present

## 2020-07-28 DIAGNOSIS — E7800 Pure hypercholesterolemia, unspecified: Secondary | ICD-10-CM | POA: Diagnosis not present

## 2020-07-28 DIAGNOSIS — Z7982 Long term (current) use of aspirin: Secondary | ICD-10-CM

## 2020-07-28 DIAGNOSIS — Z8249 Family history of ischemic heart disease and other diseases of the circulatory system: Secondary | ICD-10-CM | POA: Diagnosis not present

## 2020-07-28 DIAGNOSIS — Z79899 Other long term (current) drug therapy: Secondary | ICD-10-CM | POA: Diagnosis not present

## 2020-07-28 DIAGNOSIS — J189 Pneumonia, unspecified organism: Secondary | ICD-10-CM | POA: Diagnosis not present

## 2020-07-28 DIAGNOSIS — I708 Atherosclerosis of other arteries: Secondary | ICD-10-CM | POA: Diagnosis present

## 2020-07-28 DIAGNOSIS — R0902 Hypoxemia: Secondary | ICD-10-CM | POA: Diagnosis not present

## 2020-07-28 DIAGNOSIS — Z888 Allergy status to other drugs, medicaments and biological substances status: Secondary | ICD-10-CM | POA: Diagnosis not present

## 2020-07-28 DIAGNOSIS — I2510 Atherosclerotic heart disease of native coronary artery without angina pectoris: Secondary | ICD-10-CM | POA: Diagnosis not present

## 2020-07-28 DIAGNOSIS — Z720 Tobacco use: Secondary | ICD-10-CM | POA: Diagnosis not present

## 2020-07-28 DIAGNOSIS — J90 Pleural effusion, not elsewhere classified: Secondary | ICD-10-CM | POA: Diagnosis not present

## 2020-07-28 DIAGNOSIS — M4856XA Collapsed vertebra, not elsewhere classified, lumbar region, initial encounter for fracture: Secondary | ICD-10-CM | POA: Diagnosis present

## 2020-07-28 DIAGNOSIS — J9601 Acute respiratory failure with hypoxia: Secondary | ICD-10-CM | POA: Diagnosis not present

## 2020-07-28 DIAGNOSIS — J438 Other emphysema: Secondary | ICD-10-CM | POA: Diagnosis present

## 2020-07-28 DIAGNOSIS — M4807 Spinal stenosis, lumbosacral region: Secondary | ICD-10-CM | POA: Diagnosis present

## 2020-07-28 DIAGNOSIS — F32A Depression, unspecified: Secondary | ICD-10-CM | POA: Diagnosis present

## 2020-07-28 DIAGNOSIS — I110 Hypertensive heart disease with heart failure: Secondary | ICD-10-CM | POA: Diagnosis not present

## 2020-07-28 DIAGNOSIS — I5023 Acute on chronic systolic (congestive) heart failure: Secondary | ICD-10-CM | POA: Diagnosis not present

## 2020-07-28 DIAGNOSIS — E785 Hyperlipidemia, unspecified: Secondary | ICD-10-CM | POA: Diagnosis present

## 2020-07-28 DIAGNOSIS — E43 Unspecified severe protein-calorie malnutrition: Secondary | ICD-10-CM | POA: Diagnosis present

## 2020-07-28 DIAGNOSIS — Z7983 Long term (current) use of bisphosphonates: Secondary | ICD-10-CM

## 2020-07-28 DIAGNOSIS — R630 Anorexia: Secondary | ICD-10-CM | POA: Diagnosis present

## 2020-07-28 DIAGNOSIS — G8929 Other chronic pain: Secondary | ICD-10-CM | POA: Diagnosis present

## 2020-07-28 DIAGNOSIS — J690 Pneumonitis due to inhalation of food and vomit: Secondary | ICD-10-CM

## 2020-07-28 DIAGNOSIS — R600 Localized edema: Secondary | ICD-10-CM | POA: Diagnosis not present

## 2020-07-28 DIAGNOSIS — Z20822 Contact with and (suspected) exposure to covid-19: Secondary | ICD-10-CM | POA: Diagnosis not present

## 2020-07-28 DIAGNOSIS — R609 Edema, unspecified: Secondary | ICD-10-CM

## 2020-07-28 DIAGNOSIS — J449 Chronic obstructive pulmonary disease, unspecified: Secondary | ICD-10-CM | POA: Diagnosis not present

## 2020-07-28 DIAGNOSIS — J432 Centrilobular emphysema: Secondary | ICD-10-CM | POA: Diagnosis not present

## 2020-07-28 DIAGNOSIS — I77810 Thoracic aortic ectasia: Secondary | ICD-10-CM | POA: Diagnosis present

## 2020-07-28 LAB — COMPREHENSIVE METABOLIC PANEL
ALT: 15 U/L (ref 0–44)
AST: 17 U/L (ref 15–41)
Albumin: 2.7 g/dL — ABNORMAL LOW (ref 3.5–5.0)
Alkaline Phosphatase: 86 U/L (ref 38–126)
Anion gap: 11 (ref 5–15)
BUN: 17 mg/dL (ref 8–23)
CO2: 26 mmol/L (ref 22–32)
Calcium: 9.1 mg/dL (ref 8.9–10.3)
Chloride: 95 mmol/L — ABNORMAL LOW (ref 98–111)
Creatinine, Ser: 0.63 mg/dL (ref 0.61–1.24)
GFR, Estimated: >60 mL/min (ref >60)
Glucose, Bld: 105 mg/dL — ABNORMAL HIGH (ref 70–99)
Potassium: 4.1 mmol/L (ref 3.5–5.1)
Sodium: 132 mmol/L — ABNORMAL LOW (ref 135–145)
Total Bilirubin: 1.1 mg/dL (ref 0.3–1.2)
Total Protein: 5.6 g/dL — ABNORMAL LOW (ref 6.5–8.1)

## 2020-07-28 LAB — URINALYSIS, COMPLETE (UACMP) WITH MICROSCOPIC
Bacteria, UA: NONE SEEN
Bilirubin Urine: NEGATIVE
Glucose, UA: NEGATIVE mg/dL
Hgb urine dipstick: NEGATIVE
Ketones, ur: 20 mg/dL — AB
Leukocytes,Ua: NEGATIVE
Nitrite: NEGATIVE
Protein, ur: NEGATIVE mg/dL
Specific Gravity, Urine: 1.019 (ref 1.005–1.030)
pH: 5 (ref 5.0–8.0)

## 2020-07-28 LAB — CBC WITH DIFFERENTIAL/PLATELET
Abs Immature Granulocytes: 0.12 10*3/uL — ABNORMAL HIGH (ref 0.00–0.07)
Basophils Absolute: 0.1 10*3/uL (ref 0.0–0.1)
Basophils Relative: 0 %
Eosinophils Absolute: 0 10*3/uL (ref 0.0–0.5)
Eosinophils Relative: 0 %
HCT: 43 % (ref 39.0–52.0)
Hemoglobin: 14.8 g/dL (ref 13.0–17.0)
Immature Granulocytes: 1 %
Lymphocytes Relative: 10 %
Lymphs Abs: 1.1 10*3/uL (ref 0.7–4.0)
MCH: 32 pg (ref 26.0–34.0)
MCHC: 34.4 g/dL (ref 30.0–36.0)
MCV: 92.9 fL (ref 80.0–100.0)
Monocytes Absolute: 0.9 10*3/uL (ref 0.1–1.0)
Monocytes Relative: 7 %
Neutro Abs: 9.3 10*3/uL — ABNORMAL HIGH (ref 1.7–7.7)
Neutrophils Relative %: 82 %
Platelets: 444 10*3/uL — ABNORMAL HIGH (ref 150–400)
RBC: 4.63 MIL/uL (ref 4.22–5.81)
RDW: 14 % (ref 11.5–15.5)
WBC: 11.5 10*3/uL — ABNORMAL HIGH (ref 4.0–10.5)
nRBC: 0 % (ref 0.0–0.2)

## 2020-07-28 LAB — RESP PANEL BY RT-PCR (FLU A&B, COVID) ARPGX2
Influenza A by PCR: NEGATIVE
Influenza B by PCR: NEGATIVE
SARS Coronavirus 2 by RT PCR: NEGATIVE

## 2020-07-28 LAB — MAGNESIUM: Magnesium: 1.7 mg/dL (ref 1.7–2.4)

## 2020-07-28 LAB — TROPONIN I (HIGH SENSITIVITY): Troponin I (High Sensitivity): 29 ng/L — ABNORMAL HIGH (ref <18)

## 2020-07-28 LAB — BRAIN NATRIURETIC PEPTIDE: B Natriuretic Peptide: 185 pg/mL — ABNORMAL HIGH (ref 0.0–100.0)

## 2020-07-28 MED ORDER — IBUPROFEN 400 MG PO TABS: 400.0000 mg | ORAL_TABLET | ORAL | Status: DC | PRN

## 2020-07-28 MED ORDER — ALBUTEROL SULFATE HFA 108 (90 BASE) MCG/ACT IN AERS
2.0000 | INHALATION_SPRAY | Freq: Four times a day (QID) | RESPIRATORY_TRACT | Status: DC | PRN
  Filled 2020-07-28: qty 6.7

## 2020-07-28 MED ORDER — OXYCODONE-ACETAMINOPHEN 7.5-325 MG PO TABS
1.0000 | ORAL_TABLET | ORAL | Status: DC | PRN
  Administered 2020-07-29 – 2020-08-01 (×7): 1 via ORAL
  Filled 2020-07-28 (×7): qty 1

## 2020-07-28 MED ORDER — FUROSEMIDE 10 MG/ML IJ SOLN
40.0000 mg | Freq: Once | INTRAMUSCULAR | Status: AC
  Administered 2020-07-28: 40 mg via INTRAVENOUS
  Filled 2020-07-28: qty 4

## 2020-07-28 MED ORDER — PRAVASTATIN SODIUM 40 MG PO TABS
80.0000 mg | ORAL_TABLET | Freq: Every day | ORAL | Status: DC
  Administered 2020-07-29 – 2020-08-01 (×4): 80 mg via ORAL
  Filled 2020-07-28 (×5): qty 2

## 2020-07-28 MED ORDER — RAMIPRIL 5 MG PO CAPS
5.0000 mg | ORAL_CAPSULE | Freq: Every day | ORAL | Status: DC
  Administered 2020-07-29 – 2020-08-01 (×4): 5 mg via ORAL
  Filled 2020-07-28 (×5): qty 1

## 2020-07-28 MED ORDER — ASPIRIN EC 81 MG PO TBEC
81.0000 mg | DELAYED_RELEASE_TABLET | Freq: Every day | ORAL | Status: DC
  Administered 2020-07-29 – 2020-08-01 (×4): 81 mg via ORAL
  Filled 2020-07-28 (×4): qty 1

## 2020-07-28 MED ORDER — DULOXETINE HCL 30 MG PO CPEP
30.0000 mg | ORAL_CAPSULE | Freq: Every day | ORAL | Status: DC
  Administered 2020-07-29 – 2020-08-01 (×4): 30 mg via ORAL
  Filled 2020-07-28 (×5): qty 1

## 2020-07-28 MED ORDER — CYCLOBENZAPRINE HCL 10 MG PO TABS: 5.0000 mg | ORAL_TABLET | Freq: Three times a day (TID) | ORAL | Status: DC | PRN

## 2020-07-28 NOTE — ED Notes (Signed)
Report received from [facility]. Pt resting on stretcher with male visitor at bedside. Pt on 2L [place]. Denies pain, denies needs at this time. AAOx4

## 2020-07-28 NOTE — H&P (Signed)
History and Physical   Roger Gutierrez YQI:[id number] DOB: 05/27/1940 DOA: 08/12/2020  Referring MD/NP/PA: Dr. Larinda Buttery  PCP: Malva Limes, MD   Outpatient Specialists: None  Patient coming from: Home  Chief Complaint: Shortness of breath  HPI: Roger Gutierrez is a 80 y.o. male with medical history significant of COPD, ongoing tobacco abuse, hypertension, coronary artery disease, osteoporosis, status post previous cardiac stents about 10 years ago, scoliosis with chronic back pain who presented to the ER with progressive shortness of breath cough and wheezing.  Patient apparently has had symptoms going on for over a week.  He started with generalized weakness.  He has not been able to eat or drink as usual.  He is having significant shortness of breath and leg swellings.  The leg swelling is all the way to his thigh.  There is slight movement makes it so short of breath that his function has gone down.  In the ER patient was noted to have fluid overload.  Symptoms suggestive of congestive heart failure.  No change in his diet recently.  Patient therefore be admitted to the hospital for further evaluation and treatment..  ED Course: Temperature is 98.1 blood pressure 135/91, 95 respiratory 27 oxygen sats 92% on 2 L.  White count is 11.5 hemoglobin 14.8.  His platelets is 444.  Sodium 132 potassium 4.1 chloride 95.  Creatinine is 0.6.  Glucose 105.  Calcium 9.1.  BNP of 185 troponin 29.  Acute viral screen is negative.  Chest x-ray shows cardiomegaly with bibasilar atelectasis left greater than right very small bilateral pleural effusions.  Patient being admitted with new onset CHF.  Review of Systems: As per HPI otherwise 10 point review of systems negative.    Past Medical History:  Diagnosis Date  . Allergy   . Cataract    removed  . COPD (chronic obstructive pulmonary disease) (HCC)   . Hypertension   . Myocardial infarction (HCC)   . Osteoporosis     Past Surgical History:   Procedure Laterality Date  . ANGIOPLASTY     cardiac; 3 stents  . ankel fusion Left 1958  . EYE SURGERY Bilateral 08/2011 and 09/2011   cataract extraction  . TONSILLECTOMY AND ADENOIDECTOMY  1950's     reports that he has been smoking cigars. He has never used smokeless tobacco. He reports current alcohol use. He reports that he does not use drugs.  Allergies  Allergen Reactions  . Megace  [Megestrol] Shortness Of Breath and Palpitations    Family History  Problem Relation Age of Onset  . Hypertension Mother   . Healthy Sister      Prior to Admission medications   Medication Sig Start Date End Date Taking? Authorizing Provider  albuterol (VENTOLIN HFA) 108 (90 Base) MCG/ACT inhaler INHALE 2 PUFFS INTO THE LUNGS EVERY 6 HOURS AS NEEDED FOR WHEEZING OR SHORTNESS OF BREATH 08/11/19  Yes Malva Limes, MD  aspirin 81 MG tablet Take 1 tablet by mouth daily.   Yes [provider]  cyclobenzaprine (FLEXERIL) 5 MG tablet Take 1 tablet (5 mg total) by mouth 3 (three) times daily as needed for muscle spasms. 07/14/20  Yes Malva Limes, MD  DULoxetine (CYMBALTA) 30 MG capsule Take 1 capsule (30 mg total) by mouth daily. 07/27/20  Yes Malva Limes, MD  ibuprofen (ADVIL,MOTRIN) 200 MG tablet Take 400 mg by mouth as needed.   Yes [provider]  oxyCODONE-acetaminophen (PERCOCET) 7.5-325 MG tablet Take 1  tablet by mouth every 4 (four) hours as needed for severe pain. 07/27/20  Yes Malva LimesFisher, Donald E, MD  pravastatin (PRAVACHOL) 80 MG tablet Take 1 tablet (80 mg total) by mouth daily. 07/06/19  Yes Malva LimesFisher, Donald E, MD  ramipril (ALTACE) 5 MG capsule TAKE 1 CAPSULE BY MOUTH EVERY DAY 08/01/2020  Yes Malva LimesFisher, Donald E, MD  Vitamin D, Ergocalciferol, (DRISDOL) 1.25 MG (50000 UNIT) CAPS capsule TAKE 1 CAPSULE BY MOUTH EVERY 7 DAYS 03/05/20  Yes Malva LimesFisher, Donald E, MD  ibandronate (BONIVA) 150 MG tablet TAKE 1 TABLET(150 MG) BY MOUTH EVERY 30 DAYS Patient not taking: No sig reported  07/30/18   Malva LimesFisher, Donald E, MD    Physical Exam: Vitals:   08/14/2020 1900 07/27/2020 1930 07/24/2020 2000 08/22/2020 2030  BP: (!) 142/90 139/88 (!) 144/88 139/90  Pulse: 88 88 88 93  Resp: 17 (!) 27 (!) 23 17  Temp:      TempSrc:      SpO2: 99% 99% 98% 98%  Weight:      Height:          Constitutional: Acutely ill looking with tachypnea, cachectic Vitals:   08/01/2020 1900 08/22/2020 1930 08/14/2020 2000 08/09/2020 2030  BP: (!) 142/90 139/88 (!) 144/88 139/90  Pulse: 88 88 88 93  Resp: 17 (!) 27 (!) 23 17  Temp:      TempSrc:      SpO2: 99% 99% 98% 98%  Weight:      Height:       Eyes: PERRL, lids and conjunctivae normal ENMT: Mucous membranes are moist. Posterior pharynx clear of any exudate or lesions.Normal dentition.  Neck: normal, supple, no masses, no thyromegaly Respiratory: Decreased air entry bilaterally with tachypnea, use of accessory muscles, Cardiovascular: Sinus tachycardia, no murmurs / rubs / gallops.  2+ extremity edema. 2+ pedal pulses. No carotid bruits.  Abdomen: no tenderness, no masses palpated. No hepatosplenomegaly. Bowel sounds positive.  Musculoskeletal: no clubbing / cyanosis. No joint deformity upper and lower extremities. Good ROM, no contractures. Normal muscle tone.  Skin: no rashes, lesions, ulcers. No induration Neurologic: CN 2-12 grossly intact. Sensation intact, DTR normal. Strength 5/5 in all 4.  Psychiatric: Normal judgment and insight. Alert and oriented x 3.  Anxious mood.     Labs on Admission: I have personally reviewed following labs and imaging studies  CBC: Recent Labs  Lab 07/25/2020 1724  WBC 11.5*  NEUTROABS 9.3*  HGB 14.8  HCT 43.0  MCV 92.9  PLT 444*   Basic Metabolic Panel: Recent Labs  Lab 08/17/2020 1724  NA 132*  K 4.1  CL 95*  CO2 26  GLUCOSE 105*  BUN 17  CREATININE 0.63  CALCIUM 9.1  MG 1.7   GFR: Estimated Creatinine Clearance: 51.2 mL/min (by C-G formula based on SCr of 0.63 mg/dL). Liver Function  Tests: Recent Labs  Lab 08/12/2020 1724  AST 17  ALT 15  ALKPHOS 86  BILITOT 1.1  PROT 5.6*  ALBUMIN 2.7*   No results for input(s): LIPASE, AMYLASE in the last 168 hours. No results for input(s): AMMONIA in the last 168 hours. Coagulation Profile: No results for input(s): INR, PROTIME in the last 168 hours. Cardiac Enzymes: No results for input(s): CKTOTAL, CKMB, CKMBINDEX, TROPONINI in the last 168 hours. BNP (last 3 results) No results for input(s): PROBNP in the last 8760 hours. HbA1C: No results for input(s): HGBA1C in the last 72 hours. CBG: No results for input(s): GLUCAP in the last 168 hours. Lipid  Profile: No results for input(s): CHOL, HDL, LDLCALC, TRIG, CHOLHDL, LDLDIRECT in the last 72 hours. Thyroid Function Tests: No results for input(s): TSH, T4TOTAL, FREET4, T3FREE, THYROIDAB in the last 72 hours. Anemia Panel: No results for input(s): VITAMINB12, FOLATE, FERRITIN, TIBC, IRON, RETICCTPCT in the last 72 hours. Urine analysis:    Component Value Date/Time   COLORURINE YELLOW (A) 08/08/2020 1702   APPEARANCEUR HAZY (A) 08/18/2020 1702   LABSPEC 1.019 08/06/2020 1702   PHURINE 5.0 08/14/2020 1702   GLUCOSEU NEGATIVE 08/06/2020 1702   HGBUR NEGATIVE 07/27/2020 1702   BILIRUBINUR NEGATIVE 08/05/2020 1702   KETONESUR 20 (A) 07/30/2020 1702   PROTEINUR NEGATIVE 07/27/2020 1702   NITRITE NEGATIVE 08/13/2020 1702   LEUKOCYTESUR NEGATIVE 08/01/2020 1702   Sepsis Labs: @LABRCNTIP (procalcitonin:4,lacticidven:4) ) Recent Results (from the past 240 hour(s))  Resp Panel by RT-PCR (Flu A&B, Covid) Nasopharyngeal Swab     Status: None   Collection Time: 07/27/2020  8:34 PM   Specimen: Nasopharyngeal Swab; Nasopharyngeal(NP) swabs in vial transport medium  Result Value Ref Range Status   SARS Coronavirus 2 by RT PCR NEGATIVE NEGATIVE Final    Comment: (NOTE) SARS-CoV-2 target nucleic acids are NOT DETECTED.  The SARS-CoV-2 RNA is generally detectable in upper  respiratory specimens during the acute phase of infection. The lowest concentration of SARS-CoV-2 viral copies this assay can detect is 138 copies/mL. A negative result does not preclude SARS-Cov-2 infection and should not be used as the sole basis for treatment or other patient management decisions. A negative result may occur with  improper specimen collection/handling, submission of specimen other than nasopharyngeal swab, presence of viral mutation(s) within the areas targeted by this assay, and inadequate number of viral copies(<138 copies/mL). A negative result must be combined with clinical observations, patient history, and epidemiological information. The expected result is Negative.  Fact Sheet for Patients:  09/27/20  Fact Sheet for Healthcare Providers:  BloggerCourse.com  This test is no t yet approved or cleared by the [employer] FDA and  has been authorized for detection and/or diagnosis of SARS-CoV-2 by FDA under an Emergency Use Authorization (EUA). This EUA will remain  in effect (meaning this test can be used) for the duration of the COVID-19 declaration under Section 564(b)(1) of the Act, 21 U.S.C.section 360bbb-3(b)(1), unless the authorization is terminated  or revoked sooner.       Influenza A by PCR NEGATIVE NEGATIVE Final   Influenza B by PCR NEGATIVE NEGATIVE Final    Comment: (NOTE) The Xpert Xpress SARS-CoV-2/FLU/RSV plus assay is intended as an aid in the diagnosis of influenza from Nasopharyngeal swab specimens and should not be used as a sole basis for treatment. Nasal washings and aspirates are unacceptable for Xpert Xpress SARS-CoV-2/FLU/RSV testing.  Fact Sheet for Patients: [place]  Fact Sheet for Healthcare Providers: BloggerCourse.com  This test is not yet approved or cleared by the [employer] FDA and has been  authorized for detection and/or diagnosis of SARS-CoV-2 by FDA under an Emergency Use Authorization (EUA). This EUA will remain in effect (meaning this test can be used) for the duration of the COVID-19 declaration under Section 564(b)(1) of the Act, 21 U.S.C. section 360bbb-3(b)(1), unless the authorization is terminated or revoked.  Performed at [facility], [address] [place], [place]      Radiological Exams on Admission: DG Chest 2 View  Result Date: 08/21/2020 CLINICAL DATA:  Generalized weakness and lethargy. EXAM: CHEST - 2 VIEW COMPARISON:  November 10, 2012 FINDINGS:  Chronic appearing increased lung markings are seen with mild to moderate severity atelectasis and/or infiltrate noted within the left lung base. Mild right basilar atelectasis and/or early infiltrate is also seen. Very small bilateral pleural effusions are noted. No pneumothorax is identified. The cardiac silhouette is moderately enlarged. Marked severity S-shaped scoliosis of the thoracolumbar spine is seen. IMPRESSION: 1. Cardiomegaly with bibasilar atelectasis and/or infiltrate, left greater than right. 2. Very small bilateral pleural effusions. Electronically Signed   By: Aram Candela M.D.   On: 07/31/20 18:00    EKG: Independently reviewed.  Shows normal sinus rhythm with a rate of 92, nonspecific ST changes.  Assessment/Plan Principal Problem:   Acute exacerbation of CHF (congestive heart failure) (HCC) Active Problems:   Anorexia   Hypercholesteremia   Benign hypertension   OP (osteoporosis)   Tobacco abuse   CAD (coronary artery disease)   COPD (chronic obstructive pulmonary disease) (HCC)     #1 new onset CHF with exacerbation: Patient will be admitted.  Get echocardiogram.  Aggressive diuresis.  Monitor on telemetry Roger Gutierrez.  Cardiology consultation in the morning after echocardiogram.  #2 coronary artery disease: Remote history of coronary artery disease with stenting.   No recent decompensation.  Continue to monitor.  #3 benign essential hypertension: Continue to monitor blood pressure. Use home regimen.  #4 tobacco abuse, counseling provided.  Offer nicotine patch.  #5 COPD: Mild exacerbation.  Continue breathing treatments  #6 scoliosis with chronic back pain: Continue home regimen   DVT prophylaxis: Lovenox Code Status: Full code Family Communication: Daughter at bedside Disposition Plan: To be determined Consults called: None Admission status: Inpatient  Severity of Illness: The appropriate patient status for this patient is INPATIENT. Inpatient status is judged to be reasonable and necessary in order to provide the required intensity of service to ensure the patient's safety. The patient's presenting symptoms, physical exam findings, and initial radiographic and laboratory data in the context of their chronic comorbidities is felt to place them at high risk for further clinical deterioration. Furthermore, it is not anticipated that the patient will be medically stable for discharge from the hospital within 2 midnights of admission. The following factors support the patient status of inpatient.   " The patient's presenting symptoms include shortness of breath. " The worrisome physical exam findings include bilateral lower extremity edema with tachypnea. " The initial radiographic and laboratory data are worrisome because of trace pleural effusions. " The chronic co-morbidities include coronary artery disease.   * I certify that at the point of admission it is my clinical judgment that the patient will require inpatient hospital care spanning beyond 2 midnights from the point of admission due to high intensity of service, high risk for further deterioration and high frequency of surveillance required.Lonia Blood MD [facility] Pager [phone]  If 7PM-7AM, please contact night-coverage [email or web address] Password [facility]  2020-07-31,  9:41 PM

## 2020-07-28 NOTE — ED Triage Notes (Signed)
Pt presents to the [facility] via EMS from home with c/o failure to thrive. Pt states that he injured his lower back 2 weeks ago while attempting to lift something. Pt has had poor appetite, generalized weakness, and lethargy since injury. Pt currently A&Ox4 with stable vital signs.

## 2020-07-28 NOTE — ED Provider Notes (Signed)
[facility] Emergency Department Provider Note   ____________________________________________   Event Date/Time   First MD Initiated Contact with Patient Aug 10, 2020 1653     (approximate)  I have reviewed the triage vital signs and the nursing notes.   HISTORY  Chief Complaint Failure To Thrive and Weakness    HPI Roger Gutierrez is a 80 y.o. male with past medical history of hypertension, hyperlipidemia, COPD, scoliosis who presents to the ED complaining of generalized weakness.  EMS reports that family is concerned patient has been weaker than usual, not eating or drinking as much as he should.  Patient states that he has had a poor appetite but he denies any abdominal pain, nausea, vomiting, or diarrhea.  He states he is feeling slightly weak but otherwise well with no fevers, cough, chest pain, shortness of breath, or dysuria.  He was recently prescribed pain medication for lower back pain following a fall, CT scan at that time was negative for acute fracture.  Patient reports pain in the left side of his back going down his left leg, worse with movement.  He denies any numbness or weakness in his lower extremities, has not had any difficulty going to the bathroom and denies any urinary retention or incontinence.        Past Medical History:  Diagnosis Date  . Allergy   . Cataract    removed  . COPD (chronic obstructive pulmonary disease) (HCC)   . Hypertension   . Myocardial infarction (HCC)   . Osteoporosis     Patient Active Problem List   Diagnosis Date Noted  . Lytic lesion of bone 11/01/2019  . Frequent headaches 11/01/2019  . Bilateral carotid artery stenosis 12/16/2016  . Inferior myocardial infarction (HCC) 12/16/2016  . Depression 10/29/2016  . Insomnia 10/29/2016  . Vitamin D deficiency 03/29/2016  . Cigar smoker 01/13/2015  . Arteriosclerosis of coronary artery 12/16/2014  . Anorexia 12/15/2014  . Long term current use of systemic  steroids 12/15/2014  . CAFL (chronic airflow limitation) (HCC) 12/15/2014  . Failure of erection 12/15/2014  . Hypercholesteremia 12/15/2014  . OP (osteoporosis) 12/15/2014  . Ischemic cardiomyopathy 06/03/2014  . Abnormal loss of weight 04/03/2009  . H/O acute poliomyelitis 12/20/2007  . Scoliosis 12/20/2007  . Benign hypertension 07/09/2007    Past Surgical History:  Procedure Laterality Date  . ANGIOPLASTY     cardiac; 3 stents  . ankel fusion Left 1958  . EYE SURGERY Bilateral 08/2011 and 09/2011   cataract extraction  . TONSILLECTOMY AND ADENOIDECTOMY  1950's    Prior to Admission medications   Medication Sig Start Date End Date Taking? Authorizing Provider  albuterol (VENTOLIN HFA) 108 (90 Base) MCG/ACT inhaler INHALE 2 PUFFS INTO THE LUNGS EVERY 6 HOURS AS NEEDED FOR WHEEZING OR SHORTNESS OF BREATH 08/11/19   Malva Limes, MD  aspirin 81 MG tablet Take 1 tablet by mouth daily.    [provider]  cyclobenzaprine (FLEXERIL) 5 MG tablet Take 1 tablet (5 mg total) by mouth 3 (three) times daily as needed for muscle spasms. 07/14/20   Malva Limes, MD  DULoxetine (CYMBALTA) 30 MG capsule Take 1 capsule (30 mg total) by mouth daily. 07/27/20   Malva Limes, MD  ibandronate (BONIVA) 150 MG tablet TAKE 1 TABLET(150 MG) BY MOUTH EVERY 30 DAYS Patient not taking: Reported on 03/03/2020 07/30/18   Malva Limes, MD  ibuprofen (ADVIL,MOTRIN) 200 MG tablet Take 400 mg by mouth as needed.  [provider]  oxyCODONE-acetaminophen (PERCOCET) 7.5-325 MG tablet Take 1 tablet by mouth every 4 (four) hours as needed for severe pain. 07/27/20   Malva LimesFisher, Donald E, MD  pravastatin (PRAVACHOL) 80 MG tablet Take 1 tablet (80 mg total) by mouth daily. 07/06/19   Malva LimesFisher, Donald E, MD  ramipril (ALTACE) 5 MG capsule TAKE 1 CAPSULE BY MOUTH EVERY DAY 08/20/2020   Malva LimesFisher, Donald E, MD  Vitamin D, Ergocalciferol, (DRISDOL) 1.25 MG (50000 UNIT) CAPS capsule TAKE 1 CAPSULE BY MOUTH EVERY  7 DAYS 03/05/20   Malva LimesFisher, Donald E, MD    Allergies Megace  [megestrol]  Family History  Problem Relation Age of Onset  . Hypertension Mother   . Healthy Sister     Social History Social History   Tobacco Use  . Smoking status: Current Some Day Smoker    Types: Cigars    Last attempt to quit: 03/25/2003    Years since quitting: 17.3  . Smokeless tobacco: Never Used  . Tobacco comment: QUIT CIGARETTES 2004 only smoked 4-5 years.   Vaping Use  . Vaping Use: Never used  Substance Use Topics  . Alcohol use: Yes    Comment: occasionally beer 1-2/monthly  . Drug use: No    Review of Systems  Constitutional: No fever/chills.  Positive for generalized weakness. Eyes: No visual changes. ENT: No sore throat. Cardiovascular: Denies chest pain. Respiratory: Denies shortness of breath. Gastrointestinal: No abdominal pain.  No nausea, no vomiting.  No diarrhea.  No constipation.  Positive for poor appetite. Genitourinary: Negative for dysuria. Musculoskeletal: Negative for back pain. Skin: Negative for rash. Neurological: Negative for headaches, focal weakness or numbness.  ____________________________________________   PHYSICAL EXAM:  VITAL SIGNS: ED Triage Vitals  Enc Vitals Group     BP 07/23/2020 1659 (!) 153/113     Pulse Rate 08/12/2020 1659 95     Resp 07/27/2020 1659 16     Temp 08/22/2020 1659 98.1 F (36.7 C)     Temp Source 08/01/2020 1659 Oral     SpO2 07/31/2020 1659 92 %     Weight 08/16/2020 1700 105 lb (47.6 kg)     Height 08/19/2020 1700 5\' 5"  (1.651 m)     Head Circumference --      Peak Flow --      Pain Score 08/18/2020 1700 4     Pain Loc --      Pain Edu? --      Excl. in GC? --     Constitutional: Alert and oriented.  Thin appearing. Eyes: Conjunctivae are normal. Head: Atraumatic. Nose: No congestion/rhinnorhea. Mouth/Throat: Mucous membranes are moist. Neck: Normal ROM Cardiovascular: Normal rate, regular rhythm. Grossly normal heart  sounds. Respiratory: Normal respiratory effort.  No retractions. Lungs CTAB. Gastrointestinal: Soft and nontender. No distention. Genitourinary: deferred Musculoskeletal: No lower extremity tenderness, 1+ pitting edema to shins bilaterally.  Severe scoliosis noted, no midline thoracic or lumbar spinal tenderness to palpation. Neurologic:  Normal speech and language. No gross focal neurologic deficits are appreciated. Skin:  Skin is warm, dry and intact. No rash noted. Psychiatric: Mood and affect are normal. Speech and behavior are normal.  ____________________________________________   LABS (all labs ordered are listed, but only abnormal results are displayed)  Labs Reviewed  CBC WITH DIFFERENTIAL/PLATELET - Abnormal; Notable for the following components:      Result Value   WBC 11.5 (*)    Platelets 444 (*)    Neutro Abs 9.3 (*)    Abs  Immature Granulocytes 0.12 (*)    All other components within normal limits  COMPREHENSIVE METABOLIC PANEL - Abnormal; Notable for the following components:   Sodium 132 (*)    Chloride 95 (*)    Glucose, Bld 105 (*)    Total Protein 5.6 (*)    Albumin 2.7 (*)    All other components within normal limits  URINALYSIS, COMPLETE (UACMP) WITH MICROSCOPIC - Abnormal; Notable for the following components:   Color, Urine YELLOW (*)    APPearance HAZY (*)    Ketones, ur 20 (*)    All other components within normal limits  BRAIN NATRIURETIC PEPTIDE - Abnormal; Notable for the following components:   B Natriuretic Peptide 185.0 (*)    All other components within normal limits  TROPONIN I (HIGH SENSITIVITY) - Abnormal; Notable for the following components:   Troponin I (High Sensitivity) 29 (*)    All other components within normal limits  RESP PANEL BY RT-PCR (FLU A&B, COVID) ARPGX2  MAGNESIUM   ____________________________________________  EKG  ED ECG REPORT I, Chesley Noon, the attending physician, personally viewed and interpreted this  ECG.   Date: 07/27/2020  EKG Time: 17:19  Rate: 92  Rhythm: normal sinus rhythm  Axis: Normal  Intervals:none  ST&T Change: None   PROCEDURES  Procedure(s) performed (including Critical Care):  .Critical Care Performed by: Chesley Noon, MD Authorized by: Chesley Noon, MD   Critical care provider statement:    Critical care time (minutes):  45   Critical care time was exclusive of:  Separately billable procedures and treating other patients and teaching time   Critical care was necessary to treat or prevent imminent or life-threatening deterioration of the following conditions:  Respiratory failure   Critical care was time spent personally by me on the following activities:  Discussions with consultants, evaluation of patient's response to treatment, examination of patient, ordering and performing treatments and interventions, ordering and review of laboratory studies, ordering and review of radiographic studies, pulse oximetry, re-evaluation of patient's condition, obtaining history from patient or surrogate and review of old charts   I assumed direction of critical care for this patient from another provider in my specialty: no     Care discussed with: admitting provider       ____________________________________________   INITIAL IMPRESSION / ASSESSMENT AND PLAN / ED COURSE       80 year old male with past medical history of hypertension, hyperlipidemia, COPD, and scoliosis who presents to the ED complaining of generalized weakness and poor appetite, was recently seen in the ED for fall with back pain and prescribed pain medication.  He is awake and alert on my evaluation with no focal neurologic deficits.  He denies any abdominal pain and has no tenderness on exam.  He does report 1 week of edema to his lower extremities along with poor appetite.  We will screen labs including electrolytes, LFTs, and lipase.  While he has some ongoing back pain, he is neurovascular  intact to his bilateral lower extremities and there are no symptoms or findings concerning for cauda equina.  Initial labs are unremarkable but patient noted to drop O2 sats to 86% on room air.  This subsequently improved on 2 L nasal cannula.  Chest x-ray reviewed by me and is concerning for cardiomegaly with infiltrate versus edema.  I doubt pneumonia given he does not have any cough or fever.  Troponin and BNP are both mildly elevated, we will diurese with Lasix, low suspicion for ACS.  Case discussed with hospitalist for admission.      ____________________________________________   FINAL CLINICAL IMPRESSION(S) / ED DIAGNOSES  Final diagnoses:  Acute respiratory failure with hypoxia (HCC)  Acute congestive heart failure, unspecified heart failure type [facility])     ED Discharge Orders    None       Note:  This document was prepared using Dragon voice recognition software and may include unintentional dictation errors.   Chesley Noon, MD 08/01/2020 2113

## 2020-07-29 ENCOUNTER — Inpatient Hospital Stay: Payer: Medicare PPO

## 2020-07-29 ENCOUNTER — Encounter: Payer: Self-pay | Admitting: Internal Medicine

## 2020-07-29 ENCOUNTER — Inpatient Hospital Stay (HOSPITAL_COMMUNITY)
Admit: 2020-07-29 | Discharge: 2020-07-29 | Disposition: A | Discharge status: Home / Self Care | Payer: Medicare PPO | Attending: Internal Medicine | Admitting: Internal Medicine

## 2020-07-29 DIAGNOSIS — R630 Anorexia: Secondary | ICD-10-CM

## 2020-07-29 DIAGNOSIS — I509 Heart failure, unspecified: Secondary | ICD-10-CM | POA: Diagnosis not present

## 2020-07-29 DIAGNOSIS — I5033 Acute on chronic diastolic (congestive) heart failure: Secondary | ICD-10-CM

## 2020-07-29 DIAGNOSIS — J9601 Acute respiratory failure with hypoxia: Secondary | ICD-10-CM

## 2020-07-29 DIAGNOSIS — I5031 Acute diastolic (congestive) heart failure: Secondary | ICD-10-CM | POA: Diagnosis not present

## 2020-07-29 LAB — ECHOCARDIOGRAM COMPLETE
AR max vel: 5.01 cm2
AV Area VTI: 5.61 cm2
AV Area mean vel: 5.13 cm2
AV Mean grad: 2 mmHg
AV Peak grad: 4 mmHg
Ao pk vel: 1 m/s
Height: 65 in
MV VTI: 5.87 cm2
S' Lateral: 2.74 cm
Weight: 1604.8 oz

## 2020-07-29 LAB — BASIC METABOLIC PANEL
Anion gap: 11 (ref 5–15)
BUN: 17 mg/dL (ref 8–23)
CO2: 29 mmol/L (ref 22–32)
Calcium: 8.5 mg/dL — ABNORMAL LOW (ref 8.9–10.3)
Chloride: 92 mmol/L — ABNORMAL LOW (ref 98–111)
Creatinine, Ser: 0.65 mg/dL (ref 0.61–1.24)
GFR, Estimated: >60 mL/min (ref >60)
Glucose, Bld: 78 mg/dL (ref 70–99)
Potassium: 3.3 mmol/L — ABNORMAL LOW (ref 3.5–5.1)
Sodium: 132 mmol/L — ABNORMAL LOW (ref 135–145)

## 2020-07-29 MED ORDER — ACETAMINOPHEN 325 MG PO TABS: 650.0000 mg | ORAL_TABLET | ORAL | Status: DC | PRN

## 2020-07-29 MED ORDER — SODIUM CHLORIDE 0.9% FLUSH
3.0000 mL | Freq: Two times a day (BID) | INTRAVENOUS | Status: DC
  Administered 2020-07-29 – 2020-08-01 (×8): 3 mL via INTRAVENOUS

## 2020-07-29 MED ORDER — POTASSIUM CHLORIDE CRYS ER 20 MEQ PO TBCR
40.0000 meq | EXTENDED_RELEASE_TABLET | Freq: Once | ORAL | Status: AC
  Administered 2020-07-29: 40 meq via ORAL
  Filled 2020-07-29: qty 2

## 2020-07-29 MED ORDER — FUROSEMIDE 10 MG/ML IJ SOLN
40.0000 mg | Freq: Two times a day (BID) | INTRAMUSCULAR | Status: DC
  Administered 2020-07-29 – 2020-08-01 (×7): 40 mg via INTRAVENOUS
  Filled 2020-07-29 (×7): qty 4

## 2020-07-29 MED ORDER — ONDANSETRON HCL 4 MG/2ML IJ SOLN
4.0000 mg | Freq: Four times a day (QID) | INTRAMUSCULAR | Status: DC | PRN
Start: 2020-07-29 — End: 2020-08-02

## 2020-07-29 MED ORDER — SODIUM CHLORIDE 0.9 % IV SOLN: 250.0000 mL | INTRAVENOUS | Status: DC | PRN

## 2020-07-29 MED ORDER — ENOXAPARIN SODIUM 40 MG/0.4ML IJ SOSY
40.0000 mg | PREFILLED_SYRINGE | INTRAMUSCULAR | Status: DC
  Administered 2020-07-29 – 2020-07-31 (×3): 40 mg via SUBCUTANEOUS
  Filled 2020-07-29 (×3): qty 0.4

## 2020-07-29 MED ORDER — SODIUM CHLORIDE 0.9% FLUSH: 3.0000 mL | INTRAVENOUS | Status: DC | PRN

## 2020-07-29 MED ORDER — IOHEXOL 350 MG/ML SOLN
75.0000 mL | Freq: Once | INTRAVENOUS | Status: AC | PRN
  Administered 2020-07-29: 75 mL via INTRAVENOUS

## 2020-07-29 MED ORDER — IPRATROPIUM-ALBUTEROL 0.5-2.5 (3) MG/3ML IN SOLN
3.0000 mL | Freq: Three times a day (TID) | RESPIRATORY_TRACT | Status: DC
  Administered 2020-07-29 – 2020-07-30 (×2): 3 mL via RESPIRATORY_TRACT
  Filled 2020-07-29 (×3): qty 3

## 2020-07-29 MED ORDER — ENSURE ENLIVE PO LIQD
237.0000 mL | Freq: Two times a day (BID) | ORAL | Status: DC
  Administered 2020-07-29: 237 mL via ORAL

## 2020-07-29 NOTE — Progress Notes (Signed)
[facility]                                                                              Patient Demographics  Roger Gutierrez, is a 80 y.o. male, DOB - [birth year], BJY:[id number]  Admit date - 08/12/2020   Admitting Physician Rometta EmeryMohammad L Garba, MD  Outpatient Primary MD for the patient is Fisher, Demetrios Isaacsonald E, MD  Outpatient specialists:   LOS - 1  days   Medical records reviewed and are as summarized below:    Chief Complaint  Patient presents with  . Failure To Thrive  . Weakness       Brief summary   Patient is a 80 year old male with history of COPD, ongoing tobacco use, hypertension, CAD status post prior stents 10 years ago, osteoporosis, scoliosis, chronic back pain presented to ED with progressive shortness of breath, coughing and wheezing for a week.  Patient also had generalized weakness, leg swelling.   Chest x-ray showed cardiomegaly with bibasilar atelectasis left greater than right, small bilateral pleural effusions.  Patient was admitted for acute CHF exacerbation.  BNP 185.  O2 sats 92% on 2 L   Assessment & Plan    Principal Problem:   Acute exacerbation of diastolic CHF (congestive heart failure) (HCC) -Underlying history of COPD, smoking, hypertension, CAD -Started on IV Lasix 40 mg BID for diuresis -Strict I's and O's and daily weights, follow renal panel -2D echo showed EF of 55 to 60% with grade 1 DD, right ventricular function normal, moderate to marked aortic dilation, recommending CTA -[facility], Dr Gwen PoundsKowalski, last visit in 2018, called cardiology consult -Potassium replaced, doppler US LE   Active Problems: History of coronary artery disease status post PCI -Currently no acute chest pain, continue aspirin, Lasix, ACEI, statin    Hypercholesteremia -Continue statin    Benign hypertension -BP currently stable, continue Lasix, ACE inhibitor    Tobacco abuse -Counseled on nicotine cessation      COPD (chronic obstructive pulmonary disease) (HCC) with mild exacerbation -Placed on scheduled nebs today  Scoliosis with chronic back pain Continue home regimen  Anorexia with hypoalbuminemia, cachectic appearance Estimated body mass index is 16.69 kg/m as calculated from the following:   Height as of this encounter: 5\' 5"  (1.651 m).   Weight as of this encounter: 45.5 kg.  -Registered dietitian consult, placed on nutritional supplements  Code Status full code DVT Prophylaxis:  enoxaparin (LOVENOX) injection 40 mg Start: 07/29/20 0800   Level of Care: Level of care: Progressive Cardiac Family Communication: Discussed all imaging results, lab results, explained to the patient    Disposition Plan:     Status is: Inpatient  Remains inpatient appropriate because:Inpatient level of care appropriate due to severity of illness   Dispo: The patient is from: Home              Anticipated d/c is to: Home              Patient currently is not medically stable to d/c.  On IV Lasix diuresis, work-up in progress   Difficult to place patient No  Time Spent in minutes    Procedures:  None   Consultants:   cardiology  Antimicrobials:   Anti-infectives (From admission, onward)   None          Medications  Scheduled Meds: . aspirin EC  81 mg Oral Daily  . DULoxetine  30 mg Oral Daily  . enoxaparin (LOVENOX) injection  40 mg Subcutaneous Q24H  . furosemide  40 mg Intravenous BID  . potassium chloride  40 mEq Oral Once  . pravastatin  80 mg Oral Daily  . ramipril  5 mg Oral Daily  . sodium chloride flush  3 mL Intravenous Q12H   Continuous Infusions: . sodium chloride     PRN Meds:.sodium chloride, acetaminophen, albuterol, cyclobenzaprine, ibuprofen, ondansetron (ZOFRAN) IV, oxyCODONE-acetaminophen, sodium chloride flush      Subjective:   Roger Gutierrez was seen and examined today.  Shortness of breath is improving, still has marked pedal edema.  No  chest pain.  Patient denies dizziness, abdominal pain, N/V/D/C, new weakness, numbess, tingling. No acute events overnight.    Objective:   Vitals:   08/01/2020 2030 08/04/2020 2347 07/29/20 0500 07/29/20 0749  BP: 139/90 (!) 133/93 118/82 130/85  Pulse: 93 98 85 82  Resp: 17   17  Temp:  98 F (36.7 C) 97.7 F (36.5 C) 98.1 F (36.7 C)  TempSrc:  Oral    SpO2: 98% 93% 100% 92%  Weight:  45.5 kg    Height:   (1.651 m)      Intake/Output Summary (Last 24 hours) at 07/29/2020 1037 Last data filed at 07/29/2020 1019 Gross per 24 hour  Intake 360 ml  Output 800 ml  Net -440 ml     Wt Readings from Last 3 Encounters:  08/21/2020 45.5 kg  07/15/20 47.6 kg  03/03/20 46.7 kg     Exam  General: Alert and oriented x 3, NAD, cachectic appearing  Cardiovascular: S1 S2 auscultated, no murmurs, RRR  Respiratory: Diminished breath sound at the bases  Gastrointestinal: Soft, nontender, nondistended, + bowel sounds  Ext: 2+ pedal edema bilaterally  Neuro: no new deficits  Musculoskeletal: No digital cyanosis, clubbing  Skin: No rashes  Psych: Normal affect and demeanor, alert and oriented x3    Data Reviewed:  I have personally reviewed following labs and imaging studies  Micro Results Recent Results (from the past 240 hour(s))  Resp Panel by RT-PCR (Flu A&B, Covid) Nasopharyngeal Swab     Status: None   Collection Time: 07/30/2020  8:34 PM   Specimen: Nasopharyngeal Swab; Nasopharyngeal(NP) swabs in vial transport medium  Result Value Ref Range Status   SARS Coronavirus 2 by RT PCR NEGATIVE NEGATIVE Final    Comment: (NOTE) SARS-CoV-2 target nucleic acids are NOT DETECTED.  The SARS-CoV-2 RNA is generally detectable in upper respiratory specimens during the acute phase of infection. The lowest concentration of SARS-CoV-2 viral copies this assay can detect is 138 copies/mL. A negative result does not preclude SARS-Cov-2 infection and should not be used as the sole  basis for treatment or other patient management decisions. A negative result may occur with  improper specimen collection/handling, submission of specimen other than nasopharyngeal swab, presence of viral mutation(s) within the areas targeted by this assay, and inadequate number of viral copies(<138 copies/mL). A negative result must be combined with clinical observations, patient history, and epidemiological information. The expected result is Negative.  Fact Sheet for Patients:  BloggerCourse.com  Fact Sheet for Healthcare Providers:  SeriousBroker.it  This test is no t yet approved or cleared by the [place] and  has been authorized for detection and/or diagnosis of SARS-CoV-2 by FDA under an Emergency Use Authorization (EUA). This EUA will remain  in effect (meaning this test can be used) for the duration of the COVID-19 declaration under Section 564(b)(1) of the Act, 21 U.S.C.section 360bbb-3(b)(1), unless the authorization is terminated  or revoked sooner.       Influenza A by PCR NEGATIVE NEGATIVE Final   Influenza B by PCR NEGATIVE NEGATIVE Final    Comment: (NOTE) The Xpert Xpress SARS-CoV-2/FLU/RSV plus assay is intended as an aid in the diagnosis of influenza from Nasopharyngeal swab specimens and should not be used as a sole basis for treatment. Nasal washings and aspirates are unacceptable for Xpert Xpress SARS-CoV-2/FLU/RSV testing.  Fact Sheet for Patients: BloggerCourse.com  Fact Sheet for Healthcare Providers: SeriousBroker.it  This test is not yet approved or cleared by the [place] FDA and has been authorized for detection and/or diagnosis of SARS-CoV-2 by FDA under an Emergency Use Authorization (EUA). This EUA will remain in effect (meaning this test can be used) for the duration of the COVID-19 declaration under Section 564(b)(1) of the Act,  21 U.S.C. section 360bbb-3(b)(1), unless the authorization is terminated or revoked.  Performed at [facility], [address] [place], [place] [address]     Radiology Reports DG Chest 2 View  Result Date: 07/26/2020 CLINICAL DATA:  Generalized weakness and lethargy. EXAM: CHEST - 2 VIEW COMPARISON:  November 10, 2012 FINDINGS: Chronic appearing increased lung markings are seen with mild to moderate severity atelectasis and/or infiltrate noted within the left lung base. Mild right basilar atelectasis and/or early infiltrate is also seen. Very small bilateral pleural effusions are noted. No pneumothorax is identified. The cardiac silhouette is moderately enlarged. Marked severity S-shaped scoliosis of the thoracolumbar spine is seen. IMPRESSION: 1. Cardiomegaly with bibasilar atelectasis and/or infiltrate, left greater than right. 2. Very small bilateral pleural effusions. Electronically Signed   By: Aram Candela M.D.   On: 08/07/2020 18:00   DG Lumbar Spine 2-3 Views  Result Date: 07/15/2020 CLINICAL DATA:  Pain after lifting injury 2 days ago. Pain radiates to the left side. EXAM: LUMBAR SPINE - 2-3 VIEW COMPARISON:  None. FINDINGS: No visible fracture, erosion, or bone lesion. Marked lumbar levoscoliosis. Generalized osteopenia. Prominent arterial calcification of the aorta. IMPRESSION: No acute finding, although sensitivity is degraded by osteopenia and marked levoscoliosis Electronically Signed   By: Marnee Spring M.D.   On: 07/15/2020 11:05   CT Lumbar Spine Wo Contrast  Result Date: 07/15/2020 CLINICAL DATA:  Low back pain.  Increased fracture risk. EXAM: CT LUMBAR SPINE WITHOUT CONTRAST TECHNIQUE: Multidetector CT imaging of the lumbar spine was performed without intravenous contrast administration. Multiplanar CT image reconstructions were also generated. COMPARISON:  Lumbar spine radiographs 07/15/2020. CT abdomen and pelvis 04/12/2009. FINDINGS: Segmentation: 5 lumbar  type vertebrae. Alignment: Severe levoscoliosis with apex at L3. No significant listhesis in the sagittal plane. Vertebrae: Diffuse osteopenia. No fracture or destructive osseous process. Subcentimeter sclerotic focus in the L3 vertebral body, also present in 2011 and likely reflecting a bone island. Paraspinal and other soft tissues: Extensive abdominal aortic atherosclerosis without aneurysm. Asymmetric right-sided paraspinal muscle atrophy. Disc levels: Advanced asymmetric right-sided disc space narrowing at L3-4 with endplate and facet spurring resulting in moderate right neural foraminal stenosis. Moderate right neural foraminal stenosis at L4-5 and mild-to-moderate left neural foraminal stenosis at L5-S1 due  to disc bulging and facet spurring. No evidence of significant lumbar spinal canal stenosis. IMPRESSION: 1. No evidence of acute osseous abnormality. 2. Severe lumbar levoscoliosis. 3. Moderate right neural foraminal stenosis at L3-4 and L4-5. No evidence of significant spinal stenosis. 4. Aortic Atherosclerosis (ICD10-I70.0). Electronically Signed   By: Sebastian Ache M.D.   On: 07/15/2020 12:36   ECHOCARDIOGRAM COMPLETE  Result Date: 07/29/2020    ECHOCARDIOGRAM REPORT   Patient Name:   Roger Gutierrez Date of Exam: 07/29/2020 Medical Rec #:  [id number]      Height:       65.0 in Accession #:    [id number]     Weight:       100.3 lb Date of Birth:  [birth year]      BSA:          1.475 m Patient Age:    80 years       BP:           133/93 mmHg Patient Gender: M              HR:           94 bpm. Exam Location:  [facility] Procedure: 2D Echo Indications:     Diastolic CHF 150.31  History:         Patient has no prior history of Echocardiogram examinations.                  CAD, COPD; Risk Factors:Hypertension.  Sonographer:     L Thornton-Maynard Referring Phys:  [id number] Rometta Emery Diagnosing Phys: Kristeen Miss MD  Sonographer Comments: Image acquisition challenging due to patient body habitus. IMPRESSIONS  1.  Left ventricular ejection fraction, by estimation, is 55 to 60%. The left ventricle has normal function. The left ventricle has no regional wall motion abnormalities. Left ventricular diastolic parameters are consistent with Grade I diastolic dysfunction (impaired relaxation).  2. Right ventricular systolic function is normal. The right ventricular size is normal.  3. The mitral valve was not well visualized. No evidence of mitral valve regurgitation.  4. The aortic valve is grossly normal. Aortic valve regurgitation is not visualized. No aortic stenosis is present.  5. The echo is technically difficult and the aorta is not seen very well but it appears to be moderately - markedly dilated. Suggest CTA for better imaging of the aorta . Marland Kitchen Aortic dilatation noted. There is moderate dilatation of the aortic root, measuring 46 mm. FINDINGS  Left Ventricle: Left ventricular ejection fraction, by estimation, is 55 to 60%. The left ventricle has normal function. The left ventricle has no regional wall motion abnormalities. The left ventricular internal cavity size was normal in size. There is  no left ventricular hypertrophy. Left ventricular diastolic parameters are consistent with Grade I diastolic dysfunction (impaired relaxation). Right Ventricle: The right ventricular size is normal. No increase in right ventricular wall thickness. Right ventricular systolic function is normal. Left Atrium: Left atrial size was normal in size. Right Atrium: Right atrial size was normal in size. Pericardium: There is no evidence of pericardial effusion. Mitral Valve: The mitral valve was not well visualized. No evidence of mitral valve regurgitation. MV peak gradient, 3.4 mmHg. The mean mitral valve gradient is 2.0 mmHg. Tricuspid Valve: The tricuspid valve is not well visualized. Tricuspid valve regurgitation is not demonstrated. Aortic Valve: The aortic valve is grossly normal. Aortic valve regurgitation is not visualized. No aortic  stenosis is present. Aortic valve mean gradient measures 2.0 mmHg.  Aortic valve peak gradient measures 4.0 mmHg. Aortic valve area, by VTI measures 5.61 cm. Pulmonic Valve: The pulmonic valve was not well visualized. Pulmonic valve regurgitation is not visualized. Aorta: The echo is technically difficult and the aorta is not seen very well but it appears to be moderately - markedly dilated. Suggest CTA for better imaging of the aorta. Aortic dilatation noted. There is moderate dilatation of the aortic root, measuring 46 mm. IAS/Shunts: The atrial septum is grossly normal.  LEFT VENTRICLE PLAX 2D LVIDd:         3.94 cm  Diastology LVIDs:         2.74 cm  LV e' medial:    4.35 cm/s LV PW:         0.92 cm  LV E/e' medial:  10.0 LV IVS:        1.20 cm  LV e' lateral:   5.87 cm/s LVOT diam:     2.50 cm  LV E/e' lateral: 7.4 LV SV:         90 LV SV Index:   61 LVOT Area:     4.91 cm  RIGHT VENTRICLE RV S prime:     15.80 cm/s TAPSE (M-mode): 3.0 cm LEFT ATRIUM             Index LA diam:        2.90 cm 1.97 cm/m LA Vol (A2C):   30.6 ml 20.74 ml/m LA Vol (A4C):   48.8 ml 33.08 ml/m LA Biplane Vol: 38.8 ml 26.30 ml/m  AORTIC VALVE                   PULMONIC VALVE AV Area (Vmax):    5.01 cm    PV Vmax:       0.98 m/s AV Area (Vmean):   5.13 cm    PV Peak grad:  3.8 mmHg AV Area (VTI):     5.61 cm AV Vmax:           100.00 cm/s AV Vmean:          70.400 cm/s AV VTI:            0.160 m AV Peak Grad:      4.0 mmHg AV Mean Grad:      2.0 mmHg LVOT Vmax:         102.00 cm/s LVOT Vmean:        73.600 cm/s LVOT VTI:          0.183 m LVOT/AV VTI ratio: 1.14  AORTA Ao Root diam: 4.60 cm MITRAL VALVE MV Area VTI:  5.87 cm     SHUNTS MV Peak grad: 3.4 mmHg     Systemic VTI:  0.18 m MV Mean grad: 2.0 mmHg     Systemic Diam: 2.50 cm MV Vmax:      0.93 m/s MV Vmean:     60.4 cm/s MV E velocity: 43.30 cm/s MV A velocity: 78.00 cm/s MV E/A ratio:  0.56 Kristeen Miss MD Electronically signed by Kristeen Miss MD Signature Date/Time:  07/29/2020/9:06:01 AM    Final     Lab Data:  CBC: Recent Labs  Lab 08/11/2020 1724  WBC 11.5*  NEUTROABS 9.3*  HGB 14.8  HCT 43.0  MCV 92.9  PLT 444*   Basic Metabolic Panel: Recent Labs  Lab 08/01/2020 1724 07/29/20 0436  NA 132* 132*  K 4.1 3.3*  CL 95* 92*  CO2 26 29  GLUCOSE 105* 78  BUN 17  17  CREATININE 0.63 0.65  CALCIUM 9.1 8.5*  MG 1.7  --    GFR: Estimated Creatinine Clearance: 49 mL/min (by C-G formula based on SCr of 0.65 mg/dL). Liver Function Tests: Recent Labs  Lab August 26, 2020 1724  AST 17  ALT 15  ALKPHOS 86  BILITOT 1.1  PROT 5.6*  ALBUMIN 2.7*   No results for input(s): LIPASE, AMYLASE in the last 168 hours. No results for input(s): AMMONIA in the last 168 hours. Coagulation Profile: No results for input(s): INR, PROTIME in the last 168 hours. Cardiac Enzymes: No results for input(s): CKTOTAL, CKMB, CKMBINDEX, TROPONINI in the last 168 hours. BNP (last 3 results) No results for input(s): PROBNP in the last 8760 hours. HbA1C: No results for input(s): HGBA1C in the last 72 hours. CBG: No results for input(s): GLUCAP in the last 168 hours. Lipid Profile: No results for input(s): CHOL, HDL, LDLCALC, TRIG, CHOLHDL, LDLDIRECT in the last 72 hours. Thyroid Function Tests: No results for input(s): TSH, T4TOTAL, FREET4, T3FREE, THYROIDAB in the last 72 hours. Anemia Panel: No results for input(s): VITAMINB12, FOLATE, FERRITIN, TIBC, IRON, RETICCTPCT in the last 72 hours. Urine analysis:    Component Value Date/Time   COLORURINE YELLOW (A) 26-Aug-2020 1702   APPEARANCEUR HAZY (A) 08-26-2020 1702   LABSPEC 1.019 08-26-20 1702   PHURINE 5.0 August 26, 2020 1702   GLUCOSEU NEGATIVE 2020-08-26 1702   HGBUR NEGATIVE 08/26/20 1702   BILIRUBINUR NEGATIVE Aug 26, 2020 1702   KETONESUR 20 (A) 08/26/2020 1702   PROTEINUR NEGATIVE 08-26-2020 1702   NITRITE NEGATIVE 08/26/2020 1702   LEUKOCYTESUR NEGATIVE Aug 26, 2020 1702     Jadine Brumley M.D. [facility] 07/29/2020, 10:37 AM  Available via Epic secure chat 7am-7pm After 7 pm, please refer to night coverage provider listed on amion.

## 2020-07-29 NOTE — Plan of Care (Signed)
I, RN, had brief discussion with patient and daughter - per patient's request.  Patient asked to be made full DNR - no exceptions.  Dr. Informed and requested orders to be changed from [facility].

## 2020-07-29 NOTE — Consult Note (Signed)
Cardiology Consultation Note    Patient ID: Roger Gutierrez, MRN: [id number], DOB/AGE: 1940-03-31 80 y.o. Admit date: 07/31/2020   Date of Consult: 07/29/2020 Primary Physician: Malva LimesFisher, Donald E, MD Primary Cardiologist: Dr. Gwen PoundsKowalski  Chief Complaint: sob Reason for Consultation: dilated aortic root Requesting MD: Dr. Isidoro Donningai  HPI: Roger Gutierrez is a 80 y.o. male with history of COPD, ongoing tobacco abuse, hypertension, coronary artery disease, osteoporosis, status post previous cardiac stents about 10 years ago, scoliosis with chronic back pain who presented to the ER with progressive shortness of breath cough and wheezing.  Patient apparently has had symptoms going on for over a week.  He started with generalized weakness.  He has not been able to eat or drink as usual.  He is having significant shortness of breath and leg swellings.  The leg swelling is all the way to his thigh.  There is slight movement makes it so short of breath that his function has gone down.  In the ER patient was noted to have fluid overload.  Symptoms suggestive of congestive heart failure.  No change in his diet recently.  Patient therefore be admitted to the hospital for further evaluation and treatment..CXR showed cardiomegaly with no pulmonary edema. Echo was read as showing normal lv function but felt to show dilated aortic root. Has ruled out for an mi with hstrop of 29 and no chest pain. Normal renal function. EKG normal sinus rhythm  Past Medical History:  Diagnosis Date  . Allergy   . Cataract    removed  . COPD (chronic obstructive pulmonary disease) (HCC)   . Hypertension   . Myocardial infarction (HCC)   . Osteoporosis       Surgical History:  Past Surgical History:  Procedure Laterality Date  . ANGIOPLASTY     cardiac; 3 stents  . ankel fusion Left 1958  . EYE SURGERY Bilateral 08/2011 and 09/2011   cataract extraction  . TONSILLECTOMY AND ADENOIDECTOMY  1950's     Home Meds: Prior to Admission  medications   Medication Sig Start Date End Date Taking? Authorizing Provider  albuterol (VENTOLIN HFA) 108 (90 Base) MCG/ACT inhaler INHALE 2 PUFFS INTO THE LUNGS EVERY 6 HOURS AS NEEDED FOR WHEEZING OR SHORTNESS OF BREATH 08/11/19  Yes Malva LimesFisher, Donald E, MD  aspirin 81 MG tablet Take 1 tablet by mouth daily.   Yes [provider]  cyclobenzaprine (FLEXERIL) 5 MG tablet Take 1 tablet (5 mg total) by mouth 3 (three) times daily as needed for muscle spasms. 07/14/20  Yes Malva LimesFisher, Donald E, MD  DULoxetine (CYMBALTA) 30 MG capsule Take 1 capsule (30 mg total) by mouth daily. 07/27/20  Yes Malva LimesFisher, Donald E, MD  ibuprofen (ADVIL,MOTRIN) 200 MG tablet Take 400 mg by mouth as needed.   Yes [provider]  oxyCODONE-acetaminophen (PERCOCET) 7.5-325 MG tablet Take 1 tablet by mouth every 4 (four) hours as needed for severe pain. 07/27/20  Yes Malva LimesFisher, Donald E, MD  pravastatin (PRAVACHOL) 80 MG tablet Take 1 tablet (80 mg total) by mouth daily. 07/06/19  Yes Malva LimesFisher, Donald E, MD  ramipril (ALTACE) 5 MG capsule TAKE 1 CAPSULE BY MOUTH EVERY DAY 08/18/2020  Yes Malva LimesFisher, Donald E, MD  Vitamin D, Ergocalciferol, (DRISDOL) 1.25 MG (50000 UNIT) CAPS capsule TAKE 1 CAPSULE BY MOUTH EVERY 7 DAYS 03/05/20  Yes Malva LimesFisher, Donald E, MD  ibandronate (BONIVA) 150 MG tablet TAKE 1 TABLET(150 MG) BY MOUTH EVERY 30 DAYS Patient not taking: No sig reported  07/30/18   Malva Limes, MD    Inpatient Medications:  . aspirin EC  81 mg Oral Daily  . DULoxetine  30 mg Oral Daily  . enoxaparin (LOVENOX) injection  40 mg Subcutaneous Q24H  . furosemide  40 mg Intravenous BID  . potassium chloride  40 mEq Oral Once  . pravastatin  80 mg Oral Daily  . ramipril  5 mg Oral Daily  . sodium chloride flush  3 mL Intravenous Q12H   . sodium chloride      Allergies:  Allergies  Allergen Reactions  . Megace  [Megestrol] Shortness Of Breath and Palpitations    Social History   Socioeconomic History  . Marital status:  Married    Spouse name: Not on file  . Number of children: 2  . Years of education: Not on file  . Highest education level: Some college, no degree  Occupational History  . Occupation: retired  Tobacco Use  . Smoking status: Current Some Day Smoker    Types: Cigars    Last attempt to quit: 03/25/2003    Years since quitting: 17.3  . Smokeless tobacco: Never Used  . Tobacco comment: QUIT CIGARETTES 2004 only smoked 4-5 years.   Vaping Use  . Vaping Use: Never used  Substance and Sexual Activity  . Alcohol use: Yes    Comment: occasionally beer 1-2/monthly  . Drug use: No  . Sexual activity: Not on file    Comment: 1 cigar daily  Other Topics Concern  . Not on file  Social History Narrative  . Not on file   Social Determinants of Health   Financial Resource Strain: Not on file  Food Insecurity: Not on file  Transportation Needs: Not on file  Physical Activity: Not on file  Stress: Not on file  Social Connections: Not on file  Intimate Partner Violence: Not on file     Family History  Problem Relation Age of Onset  . Hypertension Mother   . Healthy Sister      Review of Systems: A 12-system review of systems was performed and is negative except as noted in the HPI.  Labs: No results for input(s): CKTOTAL, CKMB, TROPONINI in the last 72 hours. Lab Results  Component Value Date   WBC 11.5 (H) 08/14/2020   HGB 14.8 08/03/2020   HCT 43.0 08/15/2020   MCV 92.9 08/22/2020   PLT 444 (H) 08/08/2020    Recent Labs  Lab 08/12/2020 1724 07/29/20 0436  NA 132* 132*  K 4.1 3.3*  CL 95* 92*  CO2 26 29  BUN 17 17  CREATININE 0.63 0.65  CALCIUM 9.1 8.5*  PROT 5.6*  --   BILITOT 1.1  --   ALKPHOS 86  --   ALT 15  --   AST 17  --   GLUCOSE 105* 78   Lab Results  Component Value Date   CHOL 178 03/03/2020   HDL 57 03/03/2020   LDLCALC 106 (H) 03/03/2020   TRIG 83 03/03/2020   No results found for: DDIMER  Radiology/Studies:  DG Chest 2 View  Result Date:  07/29/2020 CLINICAL DATA:  Generalized weakness and lethargy. EXAM: CHEST - 2 VIEW COMPARISON:  November 10, 2012 FINDINGS: Chronic appearing increased lung markings are seen with mild to moderate severity atelectasis and/or infiltrate noted within the left lung base. Mild right basilar atelectasis and/or early infiltrate is also seen. Very small bilateral pleural effusions are noted. No pneumothorax is identified. The cardiac silhouette is  moderately enlarged. Marked severity S-shaped scoliosis of the thoracolumbar spine is seen. IMPRESSION: 1. Cardiomegaly with bibasilar atelectasis and/or infiltrate, left greater than right. 2. Very small bilateral pleural effusions. Electronically Signed   By: Aram Candela M.D.   On: 08/06/2020 18:00   DG Lumbar Spine 2-3 Views  Result Date: 07/15/2020 CLINICAL DATA:  Pain after lifting injury 2 days ago. Pain radiates to the left side. EXAM: LUMBAR SPINE - 2-3 VIEW COMPARISON:  None. FINDINGS: No visible fracture, erosion, or bone lesion. Marked lumbar levoscoliosis. Generalized osteopenia. Prominent arterial calcification of the aorta. IMPRESSION: No acute finding, although sensitivity is degraded by osteopenia and marked levoscoliosis Electronically Signed   By: Marnee Spring M.D.   On: 07/15/2020 11:05   CT Lumbar Spine Wo Contrast  Result Date: 07/15/2020 CLINICAL DATA:  Low back pain.  Increased fracture risk. EXAM: CT LUMBAR SPINE WITHOUT CONTRAST TECHNIQUE: Multidetector CT imaging of the lumbar spine was performed without intravenous contrast administration. Multiplanar CT image reconstructions were also generated. COMPARISON:  Lumbar spine radiographs 07/15/2020. CT abdomen and pelvis 04/12/2009. FINDINGS: Segmentation: 5 lumbar type vertebrae. Alignment: Severe levoscoliosis with apex at L3. No significant listhesis in the sagittal plane. Vertebrae: Diffuse osteopenia. No fracture or destructive osseous process. Subcentimeter sclerotic focus in the L3  vertebral body, also present in 2011 and likely reflecting a bone island. Paraspinal and other soft tissues: Extensive abdominal aortic atherosclerosis without aneurysm. Asymmetric right-sided paraspinal muscle atrophy. Disc levels: Advanced asymmetric right-sided disc space narrowing at L3-4 with endplate and facet spurring resulting in moderate right neural foraminal stenosis. Moderate right neural foraminal stenosis at L4-5 and mild-to-moderate left neural foraminal stenosis at L5-S1 due to disc bulging and facet spurring. No evidence of significant lumbar spinal canal stenosis. IMPRESSION: 1. No evidence of acute osseous abnormality. 2. Severe lumbar levoscoliosis. 3. Moderate right neural foraminal stenosis at L3-4 and L4-5. No evidence of significant spinal stenosis. 4. Aortic Atherosclerosis (ICD10-I70.0). Electronically Signed   By: Sebastian Ache M.D.   On: 07/15/2020 12:36   ECHOCARDIOGRAM COMPLETE  Result Date: 07/29/2020    ECHOCARDIOGRAM REPORT   Patient Name:   DAIWIK BUFFALO Date of Exam: 07/29/2020 Medical Rec #:  [id number]      Height:       65.0 in Accession #:    [id number]     Weight:       100.3 lb Date of Birth:  07-02-1940      BSA:          1.475 m Patient Age:    80 years       BP:           133/93 mmHg Patient Gender: M              HR:           94 bpm. Exam Location:  [facility] Procedure: 2D Echo Indications:     Diastolic CHF 150.31  History:         Patient has no prior history of Echocardiogram examinations.                  CAD, COPD; Risk Factors:Hypertension.  Sonographer:     L Thornton-Maynard Referring Phys:  [id number] Rometta Emery Diagnosing Phys: Kristeen Miss MD  Sonographer Comments: Image acquisition challenging due to patient body habitus. IMPRESSIONS  1. Left ventricular ejection fraction, by estimation, is 55 to 60%. The left ventricle has normal function. The left ventricle has no regional wall motion  abnormalities. Left ventricular diastolic parameters are consistent with  Grade I diastolic dysfunction (impaired relaxation).  2. Right ventricular systolic function is normal. The right ventricular size is normal.  3. The mitral valve was not well visualized. No evidence of mitral valve regurgitation.  4. The aortic valve is grossly normal. Aortic valve regurgitation is not visualized. No aortic stenosis is present.  5. The echo is technically difficult and the aorta is not seen very well but it appears to be moderately - markedly dilated. Suggest CTA for better imaging of the aorta . Marland Kitchen Aortic dilatation noted. There is moderate dilatation of the aortic root, measuring 46 mm. FINDINGS  Left Ventricle: Left ventricular ejection fraction, by estimation, is 55 to 60%. The left ventricle has normal function. The left ventricle has no regional wall motion abnormalities. The left ventricular internal cavity size was normal in size. There is  no left ventricular hypertrophy. Left ventricular diastolic parameters are consistent with Grade I diastolic dysfunction (impaired relaxation). Right Ventricle: The right ventricular size is normal. No increase in right ventricular wall thickness. Right ventricular systolic function is normal. Left Atrium: Left atrial size was normal in size. Right Atrium: Right atrial size was normal in size. Pericardium: There is no evidence of pericardial effusion. Mitral Valve: The mitral valve was not well visualized. No evidence of mitral valve regurgitation. MV peak gradient, 3.4 mmHg. The mean mitral valve gradient is 2.0 mmHg. Tricuspid Valve: The tricuspid valve is not well visualized. Tricuspid valve regurgitation is not demonstrated. Aortic Valve: The aortic valve is grossly normal. Aortic valve regurgitation is not visualized. No aortic stenosis is present. Aortic valve mean gradient measures 2.0 mmHg. Aortic valve peak gradient measures 4.0 mmHg. Aortic valve area, by VTI measures 5.61 cm. Pulmonic Valve: The pulmonic valve was not well visualized. Pulmonic  valve regurgitation is not visualized. Aorta: The echo is technically difficult and the aorta is not seen very well but it appears to be moderately - markedly dilated. Suggest CTA for better imaging of the aorta. Aortic dilatation noted. There is moderate dilatation of the aortic root, measuring 46 mm. IAS/Shunts: The atrial septum is grossly normal.  LEFT VENTRICLE PLAX 2D LVIDd:         3.94 cm  Diastology LVIDs:         2.74 cm  LV e' medial:    4.35 cm/s LV PW:         0.92 cm  LV E/e' medial:  10.0 LV IVS:        1.20 cm  LV e' lateral:   5.87 cm/s LVOT diam:     2.50 cm  LV E/e' lateral: 7.4 LV SV:         90 LV SV Index:   61 LVOT Area:     4.91 cm  RIGHT VENTRICLE RV S prime:     15.80 cm/s TAPSE (M-mode): 3.0 cm LEFT ATRIUM             Index LA diam:        2.90 cm 1.97 cm/m LA Vol (A2C):   30.6 ml 20.74 ml/m LA Vol (A4C):   48.8 ml 33.08 ml/m LA Biplane Vol: 38.8 ml 26.30 ml/m  AORTIC VALVE                   PULMONIC VALVE AV Area (Vmax):    5.01 cm    PV Vmax:       0.98 m/s AV Area (Vmean):  5.13 cm    PV Peak grad:  3.8 mmHg AV Area (VTI):     5.61 cm AV Vmax:           100.00 cm/s AV Vmean:          70.400 cm/s AV VTI:            0.160 m AV Peak Grad:      4.0 mmHg AV Mean Grad:      2.0 mmHg LVOT Vmax:         102.00 cm/s LVOT Vmean:        73.600 cm/s LVOT VTI:          0.183 m LVOT/AV VTI ratio: 1.14  AORTA Ao Root diam: 4.60 cm MITRAL VALVE MV Area VTI:  5.87 cm     SHUNTS MV Peak grad: 3.4 mmHg     Systemic VTI:  0.18 m MV Mean grad: 2.0 mmHg     Systemic Diam: 2.50 cm MV Vmax:      0.93 m/s MV Vmean:     60.4 cm/s MV E velocity: 43.30 cm/s MV A velocity: 78.00 cm/s MV E/A ratio:  0.56 Kristeen Miss MD Electronically signed by Kristeen Miss MD Signature Date/Time: 07/29/2020/9:06:01 AM    Final     Wt Readings from Last 3 Encounters:  31-Jul-2020 45.5 kg  07/15/20 47.6 kg  03/03/20 46.7 kg    EKG: nsr with no ischemia  Physical Exam:  Blood pressure 130/85, pulse 82, temperature  98.1 F (36.7 C), resp. rate 17, height 5\' 5"  (1.651 m), weight 45.5 kg, SpO2 92 %. Body mass index is 16.69 kg/m. General: Well developed, well nourished, in no acute distress. Head: Normocephalic, atraumatic, sclera non-icteric, no xanthomas, nares are without discharge.  Neck: Negative for carotid bruits. JVD not elevated. Lungs: Scattered rhonchi bilaterally. Heart: RRR with S1 S2. No murmurs, rubs, or gallops appreciated. Abdomen: Soft, non-tender, non-distended with normoactive bowel sounds. No hepatomegaly. No rebound/guarding. No obvious abdominal masses. Msk:  Strength and tone appear normal for age. Extremities: No clubbing or cyanosis. No edema.  Distal pedal pulses are 2+ and equal bilaterally. Neuro: Alert and oriented X 3. No facial asymmetry. No focal deficit. Moves all extremities spontaneously. Psych:  Responds to questions appropriately with a normal affect.     Assessment and Plan  80 y.o. male with history of COPD, ongoing tobacco abuse, hypertension, coronary artery disease, osteoporosis, status post previous cardiac stents about 10 years ago, scoliosis with chronic back pain who presented to the ER with progressive shortness of breath cough and wheezing. Patient apparently has had symptoms going on for over a week. He started with generalized weakness. He has not been able to eat or drink as usual. He is having significant shortness of breath and leg swellings. The leg swelling is all the way to his thigh. There is slight movement makes it so short of breath that his function has gone down. In the ER patient was noted to have fluid overload. Symptoms suggestive of congestive heart failure. No change in his diet recently. Patient therefore be admitted to the hospital for further evaluation and treatment..CXR showed cardiomegaly with no pulmonary edema. Echo was read as showing normal lv function but felt to show dilated aortic root. Has ruled out for an mi with hstrop  of 29 and no chest pain. Normal renal function. EKG normal sinus rhythm  1.  Shortness of breath-chest x-ray suggest possible mild volume overload.  Echo showed normal  EF.  Would continue with careful diuresis as you are doing following renal function and hemodynamics and urine output.  Would continue ramipril at 5 mg daily.  2.  Dilated aortic root.  Felt to have dilated aortic root by echo.  We will proceed with CTA of chest and abdomen to better evaluate this since windows were somewhat difficult.  3.  Hyperlipidemia-remain on pravastatin.  Signed, Dalia Heading MD 07/29/2020, 10:50 AM Pager: [phone]

## 2020-07-29 NOTE — Progress Notes (Signed)
PT Cancellation Note  Patient Details Name: Roger Gutierrez MRN: [id number] DOB: June 10, 1940   Cancelled Treatment:    Reason Eval/Treat Not Completed: Other (comment);Patient not medically ready PT orders received, per chart, pt with orders for LE ultrasound. Will hold PT evaluation pending doppler results.   [name], PT, DPT 07/29/20, 11:21 AM    [name] 07/29/2020, 11:20 AM

## 2020-07-30 DIAGNOSIS — I5033 Acute on chronic diastolic (congestive) heart failure: Secondary | ICD-10-CM | POA: Diagnosis not present

## 2020-07-30 DIAGNOSIS — J9601 Acute respiratory failure with hypoxia: Secondary | ICD-10-CM | POA: Diagnosis not present

## 2020-07-30 DIAGNOSIS — I509 Heart failure, unspecified: Secondary | ICD-10-CM | POA: Diagnosis not present

## 2020-07-30 DIAGNOSIS — R630 Anorexia: Secondary | ICD-10-CM | POA: Diagnosis not present

## 2020-07-30 LAB — BASIC METABOLIC PANEL
Anion gap: 12 (ref 5–15)
BUN: 19 mg/dL (ref 8–23)
CO2: 31 mmol/L (ref 22–32)
Calcium: 9 mg/dL (ref 8.9–10.3)
Chloride: 94 mmol/L — ABNORMAL LOW (ref 98–111)
Creatinine, Ser: 0.54 mg/dL — ABNORMAL LOW (ref 0.61–1.24)
GFR, Estimated: >60 mL/min (ref >60)
Glucose, Bld: 97 mg/dL (ref 70–99)
Potassium: 3.7 mmol/L (ref 3.5–5.1)
Sodium: 137 mmol/L (ref 135–145)

## 2020-07-30 MED ORDER — ENSURE ENLIVE PO LIQD
237.0000 mL | Freq: Three times a day (TID) | ORAL | Status: DC
  Administered 2020-07-30 – 2020-07-31 (×5): 237 mL via ORAL

## 2020-07-30 NOTE — Progress Notes (Signed)
.   Patient Name: Roger Gutierrez Date of Encounter: 07/30/2020  Hospital Problem List     Principal Problem:   Acute exacerbation of CHF (congestive heart failure) (HCC) Active Problems:   Anorexia   Hypercholesteremia   Benign hypertension   OP (osteoporosis)   Tobacco abuse   CAD (coronary artery disease)   COPD (chronic obstructive pulmonary disease) [facility])    Patient Profile     80 y.o. male with history of COPD, ongoing tobacco abuse, hypertension, coronary artery disease, osteoporosis, status post previous cardiac stents about 10 years ago, scoliosis with chronic back pain who presented to the ER with progressive shortness of breath cough and wheezing. Patient apparently has had symptoms going on for over a week. He started with generalized weakness. He has not been able to eat or drink as usual. He is having significant shortness of breath and leg swellings. The leg swelling is all the way to his thigh. There is slight movement makes it so short of breath that his function has gone down. In the ER patient was noted to have fluid overload. Symptoms suggestive of congestive heart failure. No change in his diet recently. Patient therefore be admitted to the hospital for further evaluation and treatment..CXR showed cardiomegaly with no pulmonary edema. Echo was read as showing normal lv function but felt to show dilated aortic root. Has ruled out for an mi with hstrop of 29 and no chest pain. Normal renal function. EKG normal sinus rhythm.   Subjective   Still with mild sob  Inpatient Medications    . aspirin EC  81 mg Oral Daily  . DULoxetine  30 mg Oral Daily  . enoxaparin (LOVENOX) injection  40 mg Subcutaneous Q24H  . feeding supplement  237 mL Oral BID BM  . furosemide  40 mg Intravenous BID  . ipratropium-albuterol  3 mL Nebulization TID  . pravastatin  80 mg Oral Daily  . ramipril  5 mg Oral Daily  . sodium chloride flush  3 mL Intravenous Q12H    Vital Signs     Vitals:   07/30/20 0400 07/30/20 0500 07/30/20 0751 07/30/20 0828  BP: (!) 128/95  (!) 124/98   Pulse:   93 83  Resp:   16   Temp: 98.6 F (37 C)  98.5 F (36.9 C)   TempSrc: Oral     SpO2: 94%  (!) 89% (!) 89%  Weight:  43.1 kg    Height:        Intake/Output Summary (Last 24 hours) at 07/30/2020 0844 Last data filed at 07/29/2020 2317 Gross per 24 hour  Intake 750 ml  Output 3050 ml  Net -2300 ml   Filed Weights   09-Aug-2020 1700 08-09-20 2347 07/30/20 0500  Weight: 47.6 kg 45.5 kg 43.1 kg    Physical Exam    GEN: Thin caucasian male in no acute distress.  HEENT: normal.  Neck: Supple, no JVD, carotid bruits, or masses. Cardiac: RRR, no murmurs, rubs, or gallops Respiratory:  Respirations regular and unlabored, scattered rhonchi. Occasional wheeze MS: no deformity or atrophy. Skin: warm and dry, no rash. Neuro:  Strength and sensation are intact. Psych: Normal affect.  Labs    CBC Recent Labs    2020-08-09 1724  WBC 11.5*  NEUTROABS 9.3*  HGB 14.8  HCT 43.0  MCV 92.9  PLT 444*   Basic Metabolic Panel Recent Labs    66/59/93 1724 07/29/20 0436 07/30/20 0428  NA 132* 132* 137  K 4.1 3.3* 3.7  CL 95* 92* 94*  CO2 26 29 31   GLUCOSE 105* 78 97  BUN 17 17 19   CREATININE 0.63 0.65 0.54*  CALCIUM 9.1 8.5* 9.0  MG 1.7  --   --    Liver Function Tests Recent Labs    07/24/2020 1724  AST 17  ALT 15  ALKPHOS 86  BILITOT 1.1  PROT 5.6*  ALBUMIN 2.7*   No results for input(s): LIPASE, AMYLASE in the last 72 hours. Cardiac Enzymes No results for input(s): CKTOTAL, CKMB, CKMBINDEX, TROPONINI in the last 72 hours. BNP Recent Labs    08/19/2020 1724  BNP 185.0*   D-Dimer No results for input(s): DDIMER in the last 72 hours. Hemoglobin A1C No results for input(s): HGBA1C in the last 72 hours. Fasting Lipid Panel No results for input(s): CHOL, HDL, LDLCALC, TRIG, CHOLHDL, LDLDIRECT in the last 72 hours. Thyroid Function Tests No results for  input(s): TSH, T4TOTAL, T3FREE, THYROIDAB in the last 72 hours.  Invalid input(s): FREET3  Telemetry    NSR   ECG    nsr  Radiology    DG Chest 2 View  Result Date: 07/29/2020 CLINICAL DATA:  Generalized weakness and lethargy. EXAM: CHEST - 2 VIEW COMPARISON:  November 10, 2012 FINDINGS: Chronic appearing increased lung markings are seen with mild to moderate severity atelectasis and/or infiltrate noted within the left lung base. Mild right basilar atelectasis and/or early infiltrate is also seen. Very small bilateral pleural effusions are noted. No pneumothorax is identified. The cardiac silhouette is moderately enlarged. Marked severity S-shaped scoliosis of the thoracolumbar spine is seen. IMPRESSION: 1. Cardiomegaly with bibasilar atelectasis and/or infiltrate, left greater than right. 2. Very small bilateral pleural effusions. Electronically Signed   By: 09/27/2020 M.D.   On: 08/08/2020 18:00   DG Lumbar Spine 2-3 Views  Result Date: 07/15/2020 CLINICAL DATA:  Pain after lifting injury 2 days ago. Pain radiates to the left side. EXAM: LUMBAR SPINE - 2-3 VIEW COMPARISON:  None. FINDINGS: No visible fracture, erosion, or bone lesion. Marked lumbar levoscoliosis. Generalized osteopenia. Prominent arterial calcification of the aorta. IMPRESSION: No acute finding, although sensitivity is degraded by osteopenia and marked levoscoliosis Electronically Signed   By: 09/27/2020 M.D.   On: 07/15/2020 11:05   CT Lumbar Spine Wo Contrast  Result Date: 07/15/2020 CLINICAL DATA:  Low back pain.  Increased fracture risk. EXAM: CT LUMBAR SPINE WITHOUT CONTRAST TECHNIQUE: Multidetector CT imaging of the lumbar spine was performed without intravenous contrast administration. Multiplanar CT image reconstructions were also generated. COMPARISON:  Lumbar spine radiographs 07/15/2020. CT abdomen and pelvis 04/12/2009. FINDINGS: Segmentation: 5 lumbar type vertebrae. Alignment: Severe levoscoliosis  with apex at L3. No significant listhesis in the sagittal plane. Vertebrae: Diffuse osteopenia. No fracture or destructive osseous process. Subcentimeter sclerotic focus in the L3 vertebral body, also present in 2011 and likely reflecting a bone island. Paraspinal and other soft tissues: Extensive abdominal aortic atherosclerosis without aneurysm. Asymmetric right-sided paraspinal muscle atrophy. Disc levels: Advanced asymmetric right-sided disc space narrowing at L3-4 with endplate and facet spurring resulting in moderate right neural foraminal stenosis. Moderate right neural foraminal stenosis at L4-5 and mild-to-moderate left neural foraminal stenosis at L5-S1 due to disc bulging and facet spurring. No evidence of significant lumbar spinal canal stenosis. IMPRESSION: 1. No evidence of acute osseous abnormality. 2. Severe lumbar levoscoliosis. 3. Moderate right neural foraminal stenosis at L3-4 and L4-5. No evidence of significant spinal stenosis. 4. Aortic Atherosclerosis (ICD10-I70.0). Electronically  Signed   By: Sebastian AcheAllen  Grady M.D.   On: 07/15/2020 12:36   US Venous Img Lower Bilateral (DVT)  Result Date: 07/29/2020 CLINICAL DATA:  80 year old male with bilateral lower extremity edema for the past week EXAM: BILATERAL LOWER EXTREMITY VENOUS DOPPLER ULTRASOUND TECHNIQUE: Gray-scale sonography with graded compression, as well as color Doppler and duplex ultrasound were performed to evaluate the lower extremity deep venous systems from the level of the common femoral vein and including the common femoral, femoral, profunda femoral, popliteal and calf veins including the posterior tibial, peroneal and gastrocnemius veins when visible. The superficial great saphenous vein was also interrogated. Spectral Doppler was utilized to evaluate flow at rest and with distal augmentation maneuvers in the common femoral, femoral and popliteal veins. COMPARISON:  None. FINDINGS: RIGHT LOWER EXTREMITY Common Femoral Vein: No  evidence of thrombus. Normal compressibility, respiratory phasicity and response to augmentation. Saphenofemoral Junction: No evidence of thrombus. Normal compressibility and flow on color Doppler imaging. Profunda Femoral Vein: No evidence of thrombus. Normal compressibility and flow on color Doppler imaging. Femoral Vein: No evidence of thrombus. Normal compressibility, respiratory phasicity and response to augmentation. Popliteal Vein: No evidence of thrombus. Normal compressibility, respiratory phasicity and response to augmentation. Calf Veins: No evidence of thrombus. Normal compressibility and flow on color Doppler imaging. Superficial Great Saphenous Vein: No evidence of thrombus. Normal compressibility. Venous Reflux:  None. Other Findings:  None. LEFT LOWER EXTREMITY Common Femoral Vein: No evidence of thrombus. Normal compressibility, respiratory phasicity and response to augmentation. Saphenofemoral Junction: No evidence of thrombus. Normal compressibility and flow on color Doppler imaging. Profunda Femoral Vein: No evidence of thrombus. Normal compressibility and flow on color Doppler imaging. Femoral Vein: No evidence of thrombus. Normal compressibility, respiratory phasicity and response to augmentation. Popliteal Vein: No evidence of thrombus. Normal compressibility, respiratory phasicity and response to augmentation. Calf Veins: No evidence of thrombus. Normal compressibility and flow on color Doppler imaging. Superficial Great Saphenous Vein: No evidence of thrombus. Normal compressibility. Venous Reflux:  None. Other Findings:  None. IMPRESSION: No evidence of deep venous thrombosis in either lower extremity. Electronically Signed   By: Malachy MoanHeath  McCullough M.D.   On: 07/29/2020 13:34   ECHOCARDIOGRAM COMPLETE  Result Date: 07/29/2020    ECHOCARDIOGRAM REPORT   Patient Name:   Sandy SalaamUL M Ditmore Date of Exam: 07/29/2020 Medical Rec #:  [id number]      Height:       65.0 in Accession #:    [id number]      Weight:       100.3 lb Date of Birth:  12-29-1940      BSA:          1.475 m Patient Age:    89 years       BP:           133/93 mmHg Patient Gender: M              HR:           94 bpm. Exam Location:  [facility] Procedure: 2D Echo Indications:     Diastolic CHF 150.31  History:         Patient has no prior history of Echocardiogram examinations.                  CAD, COPD; Risk Factors:Hypertension.  Sonographer:     L Thornton-Maynard Referring Phys:  [id number] Rometta EmeryMOHAMMAD L GARBA Diagnosing Phys: Kristeen MissPhilip Nahser MD  Sonographer Comments: Image acquisition challenging due to patient body  habitus. IMPRESSIONS  1. Left ventricular ejection fraction, by estimation, is 55 to 60%. The left ventricle has normal function. The left ventricle has no regional wall motion abnormalities. Left ventricular diastolic parameters are consistent with Grade I diastolic dysfunction (impaired relaxation).  2. Right ventricular systolic function is normal. The right ventricular size is normal.  3. The mitral valve was not well visualized. No evidence of mitral valve regurgitation.  4. The aortic valve is grossly normal. Aortic valve regurgitation is not visualized. No aortic stenosis is present.  5. The echo is technically difficult and the aorta is not seen very well but it appears to be moderately - markedly dilated. Suggest CTA for better imaging of the aorta . Marland Kitchen Aortic dilatation noted. There is moderate dilatation of the aortic root, measuring 46 mm. FINDINGS  Left Ventricle: Left ventricular ejection fraction, by estimation, is 55 to 60%. The left ventricle has normal function. The left ventricle has no regional wall motion abnormalities. The left ventricular internal cavity size was normal in size. There is  no left ventricular hypertrophy. Left ventricular diastolic parameters are consistent with Grade I diastolic dysfunction (impaired relaxation). Right Ventricle: The right ventricular size is normal. No increase in right ventricular wall  thickness. Right ventricular systolic function is normal. Left Atrium: Left atrial size was normal in size. Right Atrium: Right atrial size was normal in size. Pericardium: There is no evidence of pericardial effusion. Mitral Valve: The mitral valve was not well visualized. No evidence of mitral valve regurgitation. MV peak gradient, 3.4 mmHg. The mean mitral valve gradient is 2.0 mmHg. Tricuspid Valve: The tricuspid valve is not well visualized. Tricuspid valve regurgitation is not demonstrated. Aortic Valve: The aortic valve is grossly normal. Aortic valve regurgitation is not visualized. No aortic stenosis is present. Aortic valve mean gradient measures 2.0 mmHg. Aortic valve peak gradient measures 4.0 mmHg. Aortic valve area, by VTI measures 5.61 cm. Pulmonic Valve: The pulmonic valve was not well visualized. Pulmonic valve regurgitation is not visualized. Aorta: The echo is technically difficult and the aorta is not seen very well but it appears to be moderately - markedly dilated. Suggest CTA for better imaging of the aorta. Aortic dilatation noted. There is moderate dilatation of the aortic root, measuring 46 mm. IAS/Shunts: The atrial septum is grossly normal.  LEFT VENTRICLE PLAX 2D LVIDd:         3.94 cm  Diastology LVIDs:         2.74 cm  LV e' medial:    4.35 cm/s LV PW:         0.92 cm  LV E/e' medial:  10.0 LV IVS:        1.20 cm  LV e' lateral:   5.87 cm/s LVOT diam:     2.50 cm  LV E/e' lateral: 7.4 LV SV:         90 LV SV Index:   61 LVOT Area:     4.91 cm  RIGHT VENTRICLE RV S prime:     15.80 cm/s TAPSE (M-mode): 3.0 cm LEFT ATRIUM             Index LA diam:        2.90 cm 1.97 cm/m LA Vol (A2C):   30.6 ml 20.74 ml/m LA Vol (A4C):   48.8 ml 33.08 ml/m LA Biplane Vol: 38.8 ml 26.30 ml/m  AORTIC VALVE                   PULMONIC  VALVE AV Area (Vmax):    5.01 cm    PV Vmax:       0.98 m/s AV Area (Vmean):   5.13 cm    PV Peak grad:  3.8 mmHg AV Area (VTI):     5.61 cm AV Vmax:            100.00 cm/s AV Vmean:          70.400 cm/s AV VTI:            0.160 m AV Peak Grad:      4.0 mmHg AV Mean Grad:      2.0 mmHg LVOT Vmax:         102.00 cm/s LVOT Vmean:        73.600 cm/s LVOT VTI:          0.183 m LVOT/AV VTI ratio: 1.14  AORTA Ao Root diam: 4.60 cm MITRAL VALVE MV Area VTI:  5.87 cm     SHUNTS MV Peak grad: 3.4 mmHg     Systemic VTI:  0.18 m MV Mean grad: 2.0 mmHg     Systemic Diam: 2.50 cm MV Vmax:      0.93 m/s MV Vmean:     60.4 cm/s MV E velocity: 43.30 cm/s MV A velocity: 78.00 cm/s MV E/A ratio:  0.56 Kristeen Miss MD Electronically signed by Kristeen Miss MD Signature Date/Time: 07/29/2020/9:06:01 AM    Final    CT Angio Chest/Abd/Pel for Dissection W and/or W/WO  Result Date: 07/29/2020 CLINICAL DATA:  80 year old male with suspected thoracic aortic aneurysm. EXAM: CT ANGIOGRAPHY CHEST, ABDOMEN AND PELVIS TECHNIQUE: A noncontrast chest CT was initially performed. Multidetector CT imaging through the chest, abdomen and pelvis was performed using the standard protocol during bolus administration of intravenous contrast. Multiplanar reconstructed images and MIPs were obtained and reviewed to evaluate the vascular anatomy. CONTRAST:  75mL OMNIPAQUE IOHEXOL 350 MG/ML SOLN COMPARISON:  CT lumbar spine from 07/15/2020, CT abdomen pelvis from 04/12/2009 FINDINGS: CTA CHEST FINDINGS VASCULAR Preferential opacification of the thoracic aorta. No evidence of thoracic aortic aneurysm or dissection. Scattered atherosclerotic calcifications. Normal heart size. No pericardial effusion. Sinues of Valsalva: 35 mm 36 x 34 mm Sinotubular Junction: 34 mm Ascending Aorta: 38 mm Aortic Arch: 25 mm Descending aorta: 25 mm at the level of the carina Branch vessels: Conventional branching pattern. No significant atherosclerotic changes. Coronary arteries: Normal origins and courses. Severe atherosclerotic calcifications with proximal LAD stent in place. Main pulmonary artery: 27 mm. No evidence of central  pulmonary embolism. Pulmonary veins: No anomalous pulmonary venous return. No evidence of left atrial appendage thrombus. Review of the MIP images confirms the above findings. NON VASCULAR Mediastinum/Nodes: No enlarged mediastinal, hilar, or axillary lymph nodes. Thyroid gland, trachea, and esophagus demonstrate no significant findings. Lungs/Pleura: Secretions are noted within the carina extending into the left mainstem and lobar bronchi. Severe centrilobular and paraseptal emphysema with multifocal bullous changes in the lingula and right middle lobe. No focal consolidations or suspicious pulmonary nodules. 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 Diffuse osteopenia. No acute osseous abnormality. CTA ABDOMEN AND PELVIS FINDINGS VASCULAR Aorta: Caliber and patent throughout with coarse circumferential fibrofatty and calcific atherosclerotic changes in the infrarenal portion. Celiac: Patent without evidence of aneurysm, dissection, vasculitis or significant stenosis. SMA: Patent without evidence of aneurysm, dissection, vasculitis or significant stenosis. Renals: Single bilateral renal arteries are patent without evidence of aneurysm, dissection, vasculitis, fibromuscular dysplasia or significant  stenosis. IMA: Patent without evidence of aneurysm, dissection, vasculitis or significant stenosis. Inflow: Scattered, nearly circumferential calcific atherosclerotic calcifications. At least moderate focal stenosis about the proximal left external iliac artery secondary to atherosclerotic plaque. Veins: No obvious venous abnormality within the limitations of this arterial phase study. Review of the MIP images confirms the above findings. NON-VASCULAR Hepatobiliary: No focal liver abnormality is seen. No gallstones, gallbladder wall thickening, or biliary dilatation. Pancreas: Unremarkable. No pancreatic ductal dilatation or surrounding  inflammatory changes. Spleen: Normal in size without focal abnormality. Adrenals/Urinary Tract: Adrenal glands are not definitively visualized. Focal cortical thinning about the anterior aspect of the left inferior pole renal cortex. Kidneys are otherwise normal, without renal calculi, focal lesion, or hydronephrosis. Bladder is unremarkable. Stomach/Bowel: Stomach is within normal limits. Appendix is not definitively visualized. Moderate colonic stool burden. No evidence of bowel wall thickening, distention, or inflammatory changes. Lymphatic: No abdominopelvic lymphadenopathy. Reproductive: Prostate is unremarkable. Other: No abdominal wall hernia or abnormality. No abdominopelvic ascites. Musculoskeletal: Interval development of compression deformity of the L5 vertebral body with approximately 60% vertebral body height loss. No evidence of significant retropulsion. Diffuse osteopenia. Similar appearing sigmoid curvature of the thoracolumbar spine. IMPRESSION: VASCULAR 1. No evidence of thoracic or abdominal aortic aneurysm. 2.  Aortic Atherosclerosis (ICD10-I70.0). NON VASCULAR 1. Acute to subacute compression fracture of the L5 vertebral body with approximately 60% height loss. No evidence of significant retropulsion. Consider MRI lumbar spine for further characterization. 2. Moderate colonic stool burden. 3. Secretions noted in the distal trachea and left proximal bronchi. No definite evidence of pneumonia. 4. Severe emphysema (ICD10-J43.9). These results will be called to the ordering clinician or representative by the Radiologist Assistant, and communication documented in the PACS or [employer]. Marliss Coots, MD [facility] Electronically Signed   By: Marliss Coots MD   On: 07/29/2020 14:59    Assessment & Plan    80 y.o.malewith history ofCOPD, ongoing tobacco abuse, hypertension, coronary artery disease, osteoporosis, status post  previous cardiac stents about 10 years ago, scoliosis with chronic back pain who presented to the ER with progressive shortness of breath cough and wheezing. Patient apparently has had symptoms going on for over a week. He started with generalized weakness. He has not been able to eat or drink as usual. He is having significant shortness of breath and leg swellings. The leg swelling is all the way to his thigh. There is slight movement makes it so short of breath that his function has gone down. In the ER patient was noted to have fluid overload. Symptoms suggestive of congestive heart failure. No change in his diet recently. Patient therefore be admitted to the hospital for further evaluation and treatment..CXR showed cardiomegaly with no pulmonary edema. Echo was read as showing normal lv function but felt to show dilated aortic root. Has ruled out for an mi with hstrop of 29 and no chest pain. Normal renal function. EKG normal sinus rhythm  1.  Shortness of breath-chest x-ray suggest possible mild volume overload.  Echo showed normal EF.  Would continue with careful diuresis as you are doing following renal function and hemodynamics and urine output.  Renal function is stable.Negative 3 liters since admission.  Would continue ramipril at 5 mg daily.  2.  Dilated aortic root.  Felt to have dilated aortic root by echo.  Chest and abdominal ct revealed normal thoracic and abdominal aorta. No aneurysm or dissection.   3.  Hyperlipidemia-remain on pravastatin.  Signed, Darlin Priestly Aundray Cartlidge MD 07/30/2020, 8:44 AM  Pager: [phone]

## 2020-07-30 NOTE — Progress Notes (Signed)
[facility]                                                                              Patient Demographics  Roger Gutierrez, is a 80 y.o. male, DOB - 1941/03/10, AVW:[id number]  Admit date - 08/20/20   Admitting Physician Rometta Emery, MD  Outpatient Primary MD for the patient is Fisher, Demetrios Isaacs, MD  Outpatient specialists:   LOS - 2  days   Medical records reviewed and are as summarized below:    Chief Complaint  Patient presents with  . Failure To Thrive  . Weakness       Brief summary   Patient is a 80 year old male with history of COPD, ongoing tobacco use, hypertension, CAD status post prior stents 10 years ago, osteoporosis, scoliosis, chronic back pain presented to ED with progressive shortness of breath, coughing and wheezing for a week.  Patient also had generalized weakness, leg swelling.   Chest x-ray showed cardiomegaly with bibasilar atelectasis left greater than right, small bilateral pleural effusions.  Patient was admitted for acute CHF exacerbation.  BNP 185.  O2 sats 92% on 2 L   Assessment & Plan    Principal Problem:   Acute exacerbation of diastolic CHF (congestive heart failure) (HCC) -Underlying history of COPD, smoking, hypertension, CAD -Continue IV Lasix, 40 mg every 12 hours, negative balance of 2.7 L. -2D echo showed EF of 55 to 60% with grade 1 DD, right ventricular function normal, moderate to marked aortic dilation.  CTA chest abdomen pelvis showed no aortic dilatation. -Cardiology following -Continue ramipril 5 mg daily   Active Problems: L5 vertebral body compression fracture -Incidentally seen on CT chest abdomen pelvis, acute to subacute fracture of L5 vertebral body with approximately 60% height loss -Patient reports pain in the lower lumbar region, PT recommended SNF -will consult Dr Rosita Kea in am for possible kyphoplasty  History of coronary artery disease status post PCI -No chest pain, continue aspirin,  Lasix, statin, ramipril     Hypercholesteremia -Continue statin    Benign hypertension -Stable, continue Lasix, ramipril    Tobacco abuse -Counseled on nicotine cessation    COPD (chronic obstructive pulmonary disease) (HCC) with mild exacerbation -Continue nebs  Scoliosis with chronic back pain Continue home pain regimen  Anorexia with hypoalbuminemia, cachectic appearance Estimated body mass index is 15.81 kg/m as calculated from the following:   Height as of this encounter:  (1.651 m).   Weight as of this encounter: 43.1 kg.  -Registered dietitian consulted, placed on nutritional supplements -sedentary life style per daughter, doesn't eat much    Code Status full code DVT Prophylaxis:  enoxaparin (LOVENOX) injection 40 mg Start: 07/29/20 0800   Level of Care: Level of care: Progressive Cardiac Family Communication: Discussed all imaging results, lab results, explained to the patient and patient's daughter, Taurus Alamo on phone [phone]    Disposition Plan:     Status is: Inpatient  Remains inpatient appropriate because:Inpatient level of care appropriate due to severity of illness   Dispo: The patient is from: Home  Anticipated d/c is to: SNF              Patient currently is not medically stable to d/c.  On IV Lasix diuresis   Difficult to place patient No   Time Spent in minutes 25 mins   Procedures:  2D echo   Consultants:   cardiology  Antimicrobials:   Anti-infectives (From admission, onward)   None         Medications  Scheduled Meds: . aspirin EC  81 mg Oral Daily  . DULoxetine  30 mg Oral Daily  . enoxaparin (LOVENOX) injection  40 mg Subcutaneous Q24H  . feeding supplement  237 mL Oral TID BM  . furosemide  40 mg Intravenous BID  . pravastatin  80 mg Oral Daily  . ramipril  5 mg Oral Daily  . sodium chloride flush  3 mL Intravenous Q12H   Continuous Infusions: . sodium chloride     PRN Meds:.sodium  chloride, acetaminophen, albuterol, cyclobenzaprine, ibuprofen, ondansetron (ZOFRAN) IV, oxyCODONE-acetaminophen, sodium chloride flush      Subjective:   Tramane Gorum was seen and examined today.  Shortness of breath is improving, does have lower back pain, states no recent falls.  Pedal edema +.  Patient denies dizziness, abdominal pain, N/V/D/C. No acute events overnight.  No red flags, no lower extremity weakness, numbness or tingling.  Objective:   Vitals:   07/30/20 0500 07/30/20 0751 07/30/20 0828 07/30/20 1205  BP:  (!) 124/98  108/83  Pulse:  93 83 99  Resp:  16  17  Temp:  98.5 F (36.9 C)  97.9 F (36.6 C)  TempSrc:  Oral  Oral  SpO2:  (!) 89% (!) 89% 98%  Weight: 43.1 kg     Height:        Intake/Output Summary (Last 24 hours) at 07/30/2020 1258 Last data filed at 07/30/2020 1207 Gross per 24 hour  Intake 870 ml  Output 2325 ml  Net -1455 ml     Wt Readings from Last 3 Encounters:  07/30/20 43.1 kg  07/15/20 47.6 kg  03/03/20 46.7 kg   Physical Exam  General: Alert and oriented x 3, NAD, cachectic appearing  Cardiovascular: S1 S2 clear, RRR.  2+ pedal edema b/l  Respiratory: CTAB, no wheezing, rales or rhonchi  Gastrointestinal: Soft, nontender, nondistended, NBS  Ext: 2+ pedal edema bilaterally  Neuro: no new deficits  Musculoskeletal: Scoliosis, no cyanosis or clubbing  skin: No rashes  Psych: Normal affect and demeanor, alert and oriented x3        Data Reviewed:  I have personally reviewed following labs and imaging studies  Micro Results Recent Results (from the past 240 hour(s))  Resp Panel by RT-PCR (Flu A&B, Covid) Nasopharyngeal Swab     Status: None   Collection Time: 08/17/2020  8:34 PM   Specimen: Nasopharyngeal Swab; Nasopharyngeal(NP) swabs in vial transport medium  Result Value Ref Range Status   SARS Coronavirus 2 by RT PCR NEGATIVE NEGATIVE Final    Comment: (NOTE) SARS-CoV-2 target nucleic acids are NOT  DETECTED.  The SARS-CoV-2 RNA is generally detectable in upper respiratory specimens during the acute phase of infection. The lowest concentration of SARS-CoV-2 viral copies this assay can detect is 138 copies/mL. A negative result does not preclude SARS-Cov-2 infection and should not be used as the sole basis for treatment or other patient management decisions. A negative result may occur with  improper specimen collection/handling, submission of specimen other than nasopharyngeal swab, presence of  viral mutation(s) within the areas targeted by this assay, and inadequate number of viral copies(<138 copies/mL). A negative result must be combined with clinical observations, patient history, and epidemiological information. The expected result is Negative.  Fact Sheet for Patients:  BloggerCourse.com  Fact Sheet for Healthcare Providers:  SeriousBroker.it  This test is no t yet approved or cleared by the [place] FDA and  has been authorized for detection and/or diagnosis of SARS-CoV-2 by FDA under an Emergency Use Authorization (EUA). This EUA will remain  in effect (meaning this test can be used) for the duration of the COVID-19 declaration under Section 564(b)(1) of the Act, 21 U.S.C.section 360bbb-3(b)(1), unless the authorization is terminated  or revoked sooner.       Influenza A by PCR NEGATIVE NEGATIVE Final   Influenza B by PCR NEGATIVE NEGATIVE Final    Comment: (NOTE) The Xpert Xpress SARS-CoV-2/FLU/RSV plus assay is intended as an aid in the diagnosis of influenza from Nasopharyngeal swab specimens and should not be used as a sole basis for treatment. Nasal washings and aspirates are unacceptable for Xpert Xpress SARS-CoV-2/FLU/RSV testing.  Fact Sheet for Patients: BloggerCourse.com  Fact Sheet for Healthcare Providers: SeriousBroker.it  This test is not yet  approved or cleared by the [place] FDA and has been authorized for detection and/or diagnosis of SARS-CoV-2 by FDA under an Emergency Use Authorization (EUA). This EUA will remain in effect (meaning this test can be used) for the duration of the COVID-19 declaration under Section 564(b)(1) of the Act, 21 U.S.C. section 360bbb-3(b)(1), unless the authorization is terminated or revoked.  Performed at [facility], [address], [place], [place] [address]     Radiology Reports DG Chest 2 View  Result Date: 08/17/2020 CLINICAL DATA:  Generalized weakness and lethargy. EXAM: CHEST - 2 VIEW COMPARISON:  November 10, 2012 FINDINGS: Chronic appearing increased lung markings are seen with mild to moderate severity atelectasis and/or infiltrate noted within the left lung base. Mild right basilar atelectasis and/or early infiltrate is also seen. Very small bilateral pleural effusions are noted. No pneumothorax is identified. The cardiac silhouette is moderately enlarged. Marked severity S-shaped scoliosis of the thoracolumbar spine is seen. IMPRESSION: 1. Cardiomegaly with bibasilar atelectasis and/or infiltrate, left greater than right. 2. Very small bilateral pleural effusions. Electronically Signed   By: Aram Candela M.D.   On: 08/13/2020 18:00   DG Lumbar Spine 2-3 Views  Result Date: 07/15/2020 CLINICAL DATA:  Pain after lifting injury 2 days ago. Pain radiates to the left side. EXAM: LUMBAR SPINE - 2-3 VIEW COMPARISON:  None. FINDINGS: No visible fracture, erosion, or bone lesion. Marked lumbar levoscoliosis. Generalized osteopenia. Prominent arterial calcification of the aorta. IMPRESSION: No acute finding, although sensitivity is degraded by osteopenia and marked levoscoliosis Electronically Signed   By: Marnee Spring M.D.   On: 07/15/2020 11:05   CT Lumbar Spine Wo Contrast  Result Date: 07/15/2020 CLINICAL DATA:  Low back pain.  Increased fracture risk. EXAM: CT LUMBAR  SPINE WITHOUT CONTRAST TECHNIQUE: Multidetector CT imaging of the lumbar spine was performed without intravenous contrast administration. Multiplanar CT image reconstructions were also generated. COMPARISON:  Lumbar spine radiographs 07/15/2020. CT abdomen and pelvis 04/12/2009. FINDINGS: Segmentation: 5 lumbar type vertebrae. Alignment: Severe levoscoliosis with apex at L3. No significant listhesis in the sagittal plane. Vertebrae: Diffuse osteopenia. No fracture or destructive osseous process. Subcentimeter sclerotic focus in the L3 vertebral body, also present in 2011 and likely reflecting a bone island. Paraspinal and other  soft tissues: Extensive abdominal aortic atherosclerosis without aneurysm. Asymmetric right-sided paraspinal muscle atrophy. Disc levels: Advanced asymmetric right-sided disc space narrowing at L3-4 with endplate and facet spurring resulting in moderate right neural foraminal stenosis. Moderate right neural foraminal stenosis at L4-5 and mild-to-moderate left neural foraminal stenosis at L5-S1 due to disc bulging and facet spurring. No evidence of significant lumbar spinal canal stenosis. IMPRESSION: 1. No evidence of acute osseous abnormality. 2. Severe lumbar levoscoliosis. 3. Moderate right neural foraminal stenosis at L3-4 and L4-5. No evidence of significant spinal stenosis. 4. Aortic Atherosclerosis (ICD10-I70.0). Electronically Signed   By: Sebastian Ache M.D.   On: 07/15/2020 12:36   US Venous Img Lower Bilateral (DVT)  Result Date: 07/29/2020 CLINICAL DATA:  80 year old male with bilateral lower extremity edema for the past week EXAM: BILATERAL LOWER EXTREMITY VENOUS DOPPLER ULTRASOUND TECHNIQUE: Gray-scale sonography with graded compression, as well as color Doppler and duplex ultrasound were performed to evaluate the lower extremity deep venous systems from the level of the common femoral vein and including the common femoral, femoral, profunda femoral, popliteal and calf veins  including the posterior tibial, peroneal and gastrocnemius veins when visible. The superficial great saphenous vein was also interrogated. Spectral Doppler was utilized to evaluate flow at rest and with distal augmentation maneuvers in the common femoral, femoral and popliteal veins. COMPARISON:  None. FINDINGS: RIGHT LOWER EXTREMITY Common Femoral Vein: No evidence of thrombus. Normal compressibility, respiratory phasicity and response to augmentation. Saphenofemoral Junction: No evidence of thrombus. Normal compressibility and flow on color Doppler imaging. Profunda Femoral Vein: No evidence of thrombus. Normal compressibility and flow on color Doppler imaging. Femoral Vein: No evidence of thrombus. Normal compressibility, respiratory phasicity and response to augmentation. Popliteal Vein: No evidence of thrombus. Normal compressibility, respiratory phasicity and response to augmentation. Calf Veins: No evidence of thrombus. Normal compressibility and flow on color Doppler imaging. Superficial Great Saphenous Vein: No evidence of thrombus. Normal compressibility. Venous Reflux:  None. Other Findings:  None. LEFT LOWER EXTREMITY Common Femoral Vein: No evidence of thrombus. Normal compressibility, respiratory phasicity and response to augmentation. Saphenofemoral Junction: No evidence of thrombus. Normal compressibility and flow on color Doppler imaging. Profunda Femoral Vein: No evidence of thrombus. Normal compressibility and flow on color Doppler imaging. Femoral Vein: No evidence of thrombus. Normal compressibility, respiratory phasicity and response to augmentation. Popliteal Vein: No evidence of thrombus. Normal compressibility, respiratory phasicity and response to augmentation. Calf Veins: No evidence of thrombus. Normal compressibility and flow on color Doppler imaging. Superficial Great Saphenous Vein: No evidence of thrombus. Normal compressibility. Venous Reflux:  None. Other Findings:  None.  IMPRESSION: No evidence of deep venous thrombosis in either lower extremity. Electronically Signed   By: Malachy Moan M.D.   On: 07/29/2020 13:34   ECHOCARDIOGRAM COMPLETE  Result Date: 07/29/2020    ECHOCARDIOGRAM REPORT   Patient Name:   CHRISTOHPER DUBE Date of Exam: 07/29/2020 Medical Rec #:  [id number]      Height:       65.0 in Accession #:    [id number]     Weight:       100.3 lb Date of Birth:  01-Nov-1940      BSA:          1.475 m Patient Age:    80 years       BP:           133/93 mmHg Patient Gender: M  HR:           94 bpm. Exam Location:  [facility] Procedure: 2D Echo Indications:     Diastolic CHF 150.31  History:         Patient has no prior history of Echocardiogram examinations.                  CAD, COPD; Risk Factors:Hypertension.  Sonographer:     L Thornton-Maynard Referring Phys:  [id number] Rometta Emery Diagnosing Phys: Kristeen Miss MD  Sonographer Comments: Image acquisition challenging due to patient body habitus. IMPRESSIONS  1. Left ventricular ejection fraction, by estimation, is 55 to 60%. The left ventricle has normal function. The left ventricle has no regional wall motion abnormalities. Left ventricular diastolic parameters are consistent with Grade I diastolic dysfunction (impaired relaxation).  2. Right ventricular systolic function is normal. The right ventricular size is normal.  3. The mitral valve was not well visualized. No evidence of mitral valve regurgitation.  4. The aortic valve is grossly normal. Aortic valve regurgitation is not visualized. No aortic stenosis is present.  5. The echo is technically difficult and the aorta is not seen very well but it appears to be moderately - markedly dilated. Suggest CTA for better imaging of the aorta . Marland Kitchen Aortic dilatation noted. There is moderate dilatation of the aortic root, measuring 46 mm. FINDINGS  Left Ventricle: Left ventricular ejection fraction, by estimation, is 55 to 60%. The left ventricle has normal function. The  left ventricle has no regional wall motion abnormalities. The left ventricular internal cavity size was normal in size. There is  no left ventricular hypertrophy. Left ventricular diastolic parameters are consistent with Grade I diastolic dysfunction (impaired relaxation). Right Ventricle: The right ventricular size is normal. No increase in right ventricular wall thickness. Right ventricular systolic function is normal. Left Atrium: Left atrial size was normal in size. Right Atrium: Right atrial size was normal in size. Pericardium: There is no evidence of pericardial effusion. Mitral Valve: The mitral valve was not well visualized. No evidence of mitral valve regurgitation. MV peak gradient, 3.4 mmHg. The mean mitral valve gradient is 2.0 mmHg. Tricuspid Valve: The tricuspid valve is not well visualized. Tricuspid valve regurgitation is not demonstrated. Aortic Valve: The aortic valve is grossly normal. Aortic valve regurgitation is not visualized. No aortic stenosis is present. Aortic valve mean gradient measures 2.0 mmHg. Aortic valve peak gradient measures 4.0 mmHg. Aortic valve area, by VTI measures 5.61 cm. Pulmonic Valve: The pulmonic valve was not well visualized. Pulmonic valve regurgitation is not visualized. Aorta: The echo is technically difficult and the aorta is not seen very well but it appears to be moderately - markedly dilated. Suggest CTA for better imaging of the aorta. Aortic dilatation noted. There is moderate dilatation of the aortic root, measuring 46 mm. IAS/Shunts: The atrial septum is grossly normal.  LEFT VENTRICLE PLAX 2D LVIDd:         3.94 cm  Diastology LVIDs:         2.74 cm  LV e' medial:    4.35 cm/s LV PW:         0.92 cm  LV E/e' medial:  10.0 LV IVS:        1.20 cm  LV e' lateral:   5.87 cm/s LVOT diam:     2.50 cm  LV E/e' lateral: 7.4 LV SV:         90 LV SV Index:   61 LVOT Area:  4.91 cm  RIGHT VENTRICLE RV S prime:     15.80 cm/s TAPSE (M-mode): 3.0 cm LEFT ATRIUM              Index LA diam:        2.90 cm 1.97 cm/m LA Vol (A2C):   30.6 ml 20.74 ml/m LA Vol (A4C):   48.8 ml 33.08 ml/m LA Biplane Vol: 38.8 ml 26.30 ml/m  AORTIC VALVE                   PULMONIC VALVE AV Area (Vmax):    5.01 cm    PV Vmax:       0.98 m/s AV Area (Vmean):   5.13 cm    PV Peak grad:  3.8 mmHg AV Area (VTI):     5.61 cm AV Vmax:           100.00 cm/s AV Vmean:          70.400 cm/s AV VTI:            0.160 m AV Peak Grad:      4.0 mmHg AV Mean Grad:      2.0 mmHg LVOT Vmax:         102.00 cm/s LVOT Vmean:        73.600 cm/s LVOT VTI:          0.183 m LVOT/AV VTI ratio: 1.14  AORTA Ao Root diam: 4.60 cm MITRAL VALVE MV Area VTI:  5.87 cm     SHUNTS MV Peak grad: 3.4 mmHg     Systemic VTI:  0.18 m MV Mean grad: 2.0 mmHg     Systemic Diam: 2.50 cm MV Vmax:      0.93 m/s MV Vmean:     60.4 cm/s MV E velocity: 43.30 cm/s MV A velocity: 78.00 cm/s MV E/A ratio:  0.56 Kristeen Miss MD Electronically signed by Kristeen Miss MD Signature Date/Time: 07/29/2020/9:06:01 AM    Final    CT Angio Chest/Abd/Pel for Dissection W and/or W/WO  Result Date: 07/29/2020 CLINICAL DATA:  80 year old male with suspected thoracic aortic aneurysm. EXAM: CT ANGIOGRAPHY CHEST, ABDOMEN AND PELVIS TECHNIQUE: A noncontrast chest CT was initially performed. Multidetector CT imaging through the chest, abdomen and pelvis was performed using the standard protocol during bolus administration of intravenous contrast. Multiplanar reconstructed images and MIPs were obtained and reviewed to evaluate the vascular anatomy. CONTRAST:  26mL OMNIPAQUE IOHEXOL 350 MG/ML SOLN COMPARISON:  CT lumbar spine from 07/15/2020, CT abdomen pelvis from 04/12/2009 FINDINGS: CTA CHEST FINDINGS VASCULAR Preferential opacification of the thoracic aorta. No evidence of thoracic aortic aneurysm or dissection. Scattered atherosclerotic calcifications. Normal heart size. No pericardial effusion. Sinues of Valsalva: 35 mm 36 x 34 mm Sinotubular Junction: 34 mm  Ascending Aorta: 38 mm Aortic Arch: 25 mm Descending aorta: 25 mm at the level of the carina Branch vessels: Conventional branching pattern. No significant atherosclerotic changes. Coronary arteries: Normal origins and courses. Severe atherosclerotic calcifications with proximal LAD stent in place. Main pulmonary artery: 27 mm. No evidence of central pulmonary embolism. Pulmonary veins: No anomalous pulmonary venous return. No evidence of left atrial appendage thrombus. Review of the MIP images confirms the above findings. NON VASCULAR Mediastinum/Nodes: No enlarged mediastinal, hilar, or axillary lymph nodes. Thyroid gland, trachea, and esophagus demonstrate no significant findings. Lungs/Pleura: Secretions are noted within the carina extending into the left mainstem and lobar bronchi. Severe centrilobular and paraseptal emphysema with multifocal bullous changes in the lingula and  right middle lobe. No focal consolidations or suspicious pulmonary nodules. 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 Diffuse osteopenia. No acute osseous abnormality. CTA ABDOMEN AND PELVIS FINDINGS VASCULAR Aorta: Caliber and patent throughout with coarse circumferential fibrofatty and calcific atherosclerotic changes in the infrarenal portion. Celiac: Patent without evidence of aneurysm, dissection, vasculitis or significant stenosis. SMA: Patent without evidence of aneurysm, dissection, vasculitis or significant stenosis. Renals: Single bilateral renal arteries are patent without evidence of aneurysm, dissection, vasculitis, fibromuscular dysplasia or significant stenosis. IMA: Patent without evidence of aneurysm, dissection, vasculitis or significant stenosis. Inflow: Scattered, nearly circumferential calcific atherosclerotic calcifications. At least moderate focal stenosis about the proximal left external iliac artery secondary to atherosclerotic  plaque. Veins: No obvious venous abnormality within the limitations of this arterial phase study. Review of the MIP images confirms the above findings. NON-VASCULAR Hepatobiliary: No focal liver abnormality is seen. No gallstones, gallbladder wall thickening, or biliary dilatation. Pancreas: Unremarkable. No pancreatic ductal dilatation or surrounding inflammatory changes. Spleen: Normal in size without focal abnormality. Adrenals/Urinary Tract: Adrenal glands are not definitively visualized. Focal cortical thinning about the anterior aspect of the left inferior pole renal cortex. Kidneys are otherwise normal, without renal calculi, focal lesion, or hydronephrosis. Bladder is unremarkable. Stomach/Bowel: Stomach is within normal limits. Appendix is not definitively visualized. Moderate colonic stool burden. No evidence of bowel wall thickening, distention, or inflammatory changes. Lymphatic: No abdominopelvic lymphadenopathy. Reproductive: Prostate is unremarkable. Other: No abdominal wall hernia or abnormality. No abdominopelvic ascites. Musculoskeletal: Interval development of compression deformity of the L5 vertebral body with approximately 60% vertebral body height loss. No evidence of significant retropulsion. Diffuse osteopenia. Similar appearing sigmoid curvature of the thoracolumbar spine. IMPRESSION: VASCULAR 1. No evidence of thoracic or abdominal aortic aneurysm. 2.  Aortic Atherosclerosis (ICD10-I70.0). NON VASCULAR 1. Acute to subacute compression fracture of the L5 vertebral body with approximately 60% height loss. No evidence of significant retropulsion. Consider MRI lumbar spine for further characterization. 2. Moderate colonic stool burden. 3. Secretions noted in the distal trachea and left proximal bronchi. No definite evidence of pneumonia. 4. Severe emphysema (ICD10-J43.9). These results will be called to the ordering clinician or representative by the Radiologist Assistant, and communication  documented in the PACS or [facility]ario Dashboard. Marliss Cootsylan Suttle, MD [facility] Electronically Signed   By: Marliss Cootsylan  Suttle MD   On: 07/29/2020 14:59    Lab Data:  CBC: Recent Labs  Lab 21-Oct-2020 1724  WBC 11.5*  NEUTROABS 9.3*  HGB 14.8  HCT 43.0  MCV 92.9  PLT 444*   Basic Metabolic Panel: Recent Labs  Lab 21-Oct-2020 1724 07/29/20 0436 07/30/20 0428  NA 132* 132* 137  K 4.1 3.3* 3.7  CL 95* 92* 94*  CO2 26 29 31   GLUCOSE 105* 78 97  BUN 17 17 19   CREATININE 0.63 0.65 0.54*  CALCIUM 9.1 8.5* 9.0  MG 1.7  --   --    GFR: Estimated Creatinine Clearance: 46.4 mL/min (A) (by C-G formula based on SCr of 0.54 mg/dL (L)). Liver Function Tests: Recent Labs  Lab 21-Oct-2020 1724  AST 17  ALT 15  ALKPHOS 86  BILITOT 1.1  PROT 5.6*  ALBUMIN 2.7*   No results for input(s): LIPASE, AMYLASE in the last 168 hours. No results for input(s): AMMONIA in the last 168 hours. Coagulation Profile: No results for input(s): INR, PROTIME in the last 168 hours. Cardiac Enzymes: No results for input(s): CKTOTAL,  CKMB, CKMBINDEX, TROPONINI in the last 168 hours. BNP (last 3 results) No results for input(s): PROBNP in the last 8760 hours. HbA1C: No results for input(s): HGBA1C in the last 72 hours. CBG: No results for input(s): GLUCAP in the last 168 hours. Lipid Profile: No results for input(s): CHOL, HDL, LDLCALC, TRIG, CHOLHDL, LDLDIRECT in the last 72 hours. Thyroid Function Tests: No results for input(s): TSH, T4TOTAL, FREET4, T3FREE, THYROIDAB in the last 72 hours. Anemia Panel: No results for input(s): VITAMINB12, FOLATE, FERRITIN, TIBC, IRON, RETICCTPCT in the last 72 hours. Urine analysis:    Component Value Date/Time   COLORURINE YELLOW (A) 08/10/2020 1702   APPEARANCEUR HAZY (A) 07/27/2020 1702   LABSPEC 1.019 07/27/2020 1702   PHURINE 5.0 07/26/2020 1702   GLUCOSEU NEGATIVE 08/14/2020 1702   HGBUR NEGATIVE 07/24/2020  1702   BILIRUBINUR NEGATIVE 07/27/2020 1702   KETONESUR 20 (A) 08/14/2020 1702   PROTEINUR NEGATIVE 08/13/2020 1702   NITRITE NEGATIVE 08/06/2020 1702   LEUKOCYTESUR NEGATIVE 07/29/2020 1702     Shiah Berhow M.D. [facility] 07/30/2020, 12:58 PM  Available via Epic secure chat 7am-7pm After 7 pm, please refer to night coverage provider listed on amion.

## 2020-07-30 NOTE — Evaluation (Signed)
Occupational Therapy Evaluation Patient Details Name: Roger Gutierrez MRN: [id number] DOB: 1940/09/03 Today's Date: 07/30/2020    History of Present Illness Pt is a 80 y/o M who presented to the ED with progressive SOB, cough & wheezing for over a week. Chest x-ray showed cardiomegaly with bibasilar atelectasis left greater than right, small bilateral pleural effusions.  Patient was admitted for acute CHF exacerbation. PMH: COPD, ongoing tobacco abuse, HTN, CAD, osteoporosis, cardiac stents about 10 years ago, scoliosis with chronic back pain, MI   Clinical Impression   Pt seen for OT evaluation this date. Prior to admission, pt was mod-independent in all ADLs and functional mobility with RW, living in a multi-story home with children and wife. Pt endorses 1 fall prior to admission. Pt currently becomes easily fatigued following minimal exertion, and this date, required MIN GUARD for using urinal while standing, MIN GUARD for standing hand hygiene, and MIN GUARD for functional mobility of short household distances (~34ft).   Of note, While on RA, SpO2 desat 85% after standing to use the urinal, washing hands sink-side, and walking ~10 ft. While sitting EOB, SpO2 remained 85-87% for 2 mins while sitting and performing PLB. Pt returned to long-sitting postion in bed, and SpO2 unable to maintain >88%. [place] donned (2L/min of O2), and SpO2 able to increase to 97% after 1 min. At end of session, [place] doffed, with pt able to maintain SpO2>95% for while resting on RA. RN informed.  Pt educated in energy conservation strategies including pursed lip breathing and activity pacing. Due to pt's decreased activity tolerance and current home set-up, recommend SNF upon discharge to maximize return of PLOF and to minimize risk of future falls, injury, caregiver burden, and readmission.    Follow Up Recommendations  SNF    Equipment Recommendations  Other (comment) (defer to next venue of care)       Precautions  / Restrictions Precautions Precautions: Fall Restrictions Weight Bearing Restrictions: No      Mobility Bed Mobility Overal bed mobility: Needs Assistance Bed Mobility: Supine to Sit;Sit to Supine     Supine to sit: Supervision;HOB elevated Sit to supine: Supervision;HOB elevated        Transfers Overall transfer level: Needs assistance   Transfers: Sit to/from Stand Sit to Stand: Supervision              Balance Overall balance assessment: Needs assistance Sitting-balance support: No upper extremity supported;Feet supported Sitting balance-Leahy Scale: Good Sitting balance - Comments: good static sitting balance at EOB   Standing balance support: No upper extremity supported;During functional activity Standing balance-Leahy Scale: Fair Standing balance comment: fair standing balance while performing standing hand hygiene                           ADL either performed or assessed with clinical judgement   ADL Overall ADL's : Needs assistance/impaired     Grooming: Wash/dry hands;Min guard;Standing                       Toileting- Clothing Manipulation and Hygiene: Min guard;Sit to/from stand Toileting - Clothing Manipulation Details (indicate cue type and reason): MIN GUARD for using urinal while standing     Functional mobility during ADLs: Min guard (to walk ~75ft w/out AD)                    Pertinent Vitals/Pain Pain Assessment: No/denies pain  Extremity/Trunk Assessment Upper Extremity Assessment Upper Extremity Assessment: Generalized weakness   Lower Extremity Assessment Lower Extremity Assessment: Generalized weakness   Cervical / Trunk Assessment Cervical / Trunk Assessment: Other exceptions Cervical / Trunk Exceptions: significant scoliosis with R thoracic concavity   Communication Communication Communication: No difficulties   Cognition Arousal/Alertness: Awake/alert Behavior During Therapy: Flat  affect Overall Cognitive Status: No family/caregiver present to determine baseline cognitive functioning                                 General Comments: Pt A&Ox2, stated it was June 2022 and was unable to recall why he was in the hospital. Pt pleasant and agreeable throughout. Able to follow 1-step commands consistently   General Comments  While on RA, SpO2 desat 85% after standing to use the urinal, washing hands sink-side, and walking ~10 ft. While sitting EOB, SpO2 remained 85-87% for 2 mins while sitting and performing PLB. Pt returned to long-sitting postion in bed, and SpO2 unable to maintain >88%. [place] donned (2L/min of O2), and SpO2 able to increase to 97% after 1 min. At end of session, [place] doffed, with pt able to maintain SpO2>95% for while resting on RA. RN informed            Home Living Family/patient expects to be discharged to:: Private residence Living Arrangements: Spouse/significant other;Children (son & daughter in Social worker) Available Help at Discharge: Family;Available 24 hours/day Type of Home: House Home Access: Stairs to enter [employer] of Steps: 2 Entrance Stairs-Rails: Left Home Layout: Multi-level Alternate Level Stairs-Number of Steps: flight Alternate Level Stairs-Rails: Left Bathroom Shower/Tub: Chief Strategy Officer: Standard     Home Equipment: Environmental consultant - 2 wheels;Shower seat;Grab bars - tub/shower          Prior Functioning/Environment Level of Independence: Independent with assistive device(s)        Comments: MOD-Ind for functional mobility with RW, 1 fall prior to admission. Ind with ADLs. Children assist with IADLs. Pt drives        OT Problem List: Decreased strength;Decreased activity tolerance;Impaired balance (sitting and/or standing);Cardiopulmonary status limiting activity;Increased edema      OT Treatment/Interventions: Self-care/ADL training;Therapeutic exercise;Energy conservation;DME and/or  AE instruction;Therapeutic activities;Patient/family education;Balance training    OT Goals(Current goals can be found in the care plan section) Acute Rehab OT Goals Patient Stated Goal: to feel better OT Goal Formulation: With patient Time For Goal Achievement: 08/13/20 Potential to Achieve Goals: Good ADL Goals Pt Will Perform Lower Body Bathing: with min assist;sit to/from stand Pt Will Transfer to Toilet: with min assist;ambulating;regular height toilet Pt Will Perform Toileting - Clothing Manipulation and hygiene: with min assist;sit to/from stand  OT Frequency: Min 1X/week    AM-PAC OT "6 Clicks" Daily Activity     Outcome Measure Help from another person eating meals?: None Help from another person taking care of personal grooming?: A Little Help from another person toileting, which includes using toliet, bedpan, or urinal?: A Little Help from another person bathing (including washing, rinsing, drying)?: A Lot Help from another person to put on and taking off regular upper body clothing?: A Little Help from another person to put on and taking off regular lower body clothing?: A Lot 6 Click Score: 17   End of Session Equipment Utilized During Treatment: Oxygen Nurse Communication: Mobility status;Other (comment) (SpO2)  Activity Tolerance: Patient tolerated treatment well Patient left: in bed;with call bell/phone  within reach;with bed alarm set  OT Visit Diagnosis: Unsteadiness on feet (R26.81);Muscle weakness (generalized) (M62.81)                Time: 1660-6301 OT Time Calculation (min): 18 min Charges:  OT General Charges $OT Visit: 1 Visit OT Evaluation $OT Eval Moderate Complexity: 1 Mod OT Treatments $Self Care/Home Management : 8-22 mins  Matthew Folks, OTR/L [facility] [phone]

## 2020-07-30 NOTE — Progress Notes (Signed)
Initial Nutrition Assessment  DOCUMENTATION CODES:   Underweight  INTERVENTION:   Ensure Enlive po TID, each supplement provides 350 kcal and 20 grams of protein  Recommend liberalizing diet to Regular given underweight status with probable malnutrition and pt mostly eating bites/sips   NUTRITION DIAGNOSIS:   Inadequate oral intake related to poor appetite as evidenced by meal completion < 25%.  GOAL:   Patient will meet greater than or equal to 90% of their needs   MONITOR:   PO intake,Supplement acceptance,Labs,Weight trends  REASON FOR ASSESSMENT:   Consult Assessment of nutrition requirement/status  ASSESSMENT:   80 yo male admitted with acute CHF. PMH includes COPD, HTN, CAD   Unable to reach pt via telephone.   Recorded po intake sips/bites to 50%. <25% on average. Per chart review, pt has not been able to eat and drink as usual over the past week. Pt with generalized weakness.   Pt is very underweight based on BMI. Needs nutrition focused physical exam on follow-up  Labs: reviewed Meds: lasix, KCl  Diet Order:   Diet Order            Diet regular Room service appropriate? Yes; Fluid consistency: Thin  Diet effective now                 EDUCATION NEEDS:   Not appropriate for education at this time  Skin:  Skin Assessment: Reviewed RN Assessment  Last BM:  no documented BM  Height:   Ht Readings from Last 1 Encounters:  08/17/2020 5\' 5"  (1.651 m)    Weight:   Wt Readings from Last 1 Encounters:  07/30/20 43.1 kg    BMI:  Body mass index is 15.81 kg/m.  Estimated Nutritional Needs:   Kcal:  1700-1900 kcals  Protein:  85-95 g  Fluid:  >/= 1.7 L  09/29/20 MS, RDN, LDN, CNSC Registered Dietitian III Clinical Nutrition RD Pager and On-Call Pager Number Located in [place]

## 2020-07-30 NOTE — Evaluation (Signed)
Physical Therapy Evaluation Patient Details Name: Roger Gutierrez MRN: [id number] DOB: June 30, 1940 Today's Date: 07/30/2020   History of Present Illness  Pt is a 80 y/o M who presented to the ED with progressive SOB, cough & wheezing for over a week. Chest x-ray showed cardiomegaly with bibasilar atelectasis left greater than right, small bilateral pleural effusions.  Patient was admitted for acute CHF exacerbation. PMH: COPD, ongoing tobacco abuse, HTN, CAD, osteoporosis, cardiac stents about 10 years ago, scoliosis with chronic back pain, MI    Clinical Impression  Pt seen for PT evaluation with pt reporting he is mod I for mobility with RW at home. Pt currently requires supervision & hospital bed features for bed mobility, supervision for transfers, & supervision for short distance gait. Pt reports he is limited by fatigue & SOB. Pt on room air upon PT arrival & throughout session SpO2 85-88% with pt unable to increase further even with pursed lip breathing. Nurse made aware & pt placed on 2L/min via nasal cannula & SPO2 >90%. Pt performs BLE strengthening exercises with instructional cuing for technique & frequency in which he should perform exercises throughout the day. Due to pt having multiple steps to access bed/bath at home and limited mobility at this time, currently recommending STR upon d/c to maximize independence with functional mobility & reduce fall risk prior to return home.     Follow Up Recommendations SNF    Equipment Recommendations  None recommended by PT    Recommendations for Other Services OT consult     Precautions / Restrictions Precautions Precautions: Fall Restrictions Weight Bearing Restrictions: No      Mobility  Bed Mobility Overal bed mobility: Needs Assistance Bed Mobility: Supine to Sit     Supine to sit: Supervision;HOB elevated     General bed mobility comments: supine>sit with HOB elevated but pt assisting BLE to EOB with UE     Transfers Overall transfer level: Needs assistance   Transfers: Sit to/from Stand Sit to Stand: Supervision            Ambulation/Gait Ambulation/Gait assistance: Supervision Gait Distance (Feet): 30 Feet Assistive device: Rolling walker (2 wheeled) Gait Pattern/deviations: Decreased step length - left;Decreased step length - right;Decreased stride length;Trunk flexed Gait velocity: decreased      Stairs            Wheelchair Mobility    Modified Rankin (Stroke Patients Only)       Balance Overall balance assessment: Needs assistance Sitting-balance support: Feet supported Sitting balance-Leahy Scale: Good       Standing balance-Leahy Scale: Fair                               Pertinent Vitals/Pain Pain Assessment: 0-10 Pain Score: 4  Pain Location: low back Pain Descriptors / Indicators: Aching Pain Intervention(s): Limited activity within patient's tolerance;Monitored during session (pt declined asking nurse for pain meds)    Home Living Family/patient expects to be discharged to:: Private residence Living Arrangements: Spouse/significant other Available Help at Discharge: Family;Available 24 hours/day Type of Home: House Home Access: Stairs to enter Entrance Stairs-Rails: Left Entrance Stairs-Number of Steps: 2 Home Layout: Multi-level Home Equipment: Walker - 2 wheels      Prior Function Level of Independence: Independent with assistive device(s)         Comments: ambulatory with RW, 1 fall prior to admission     Hand Dominance  Extremity/Trunk Assessment   Upper Extremity Assessment Upper Extremity Assessment: Generalized weakness    Lower Extremity Assessment Lower Extremity Assessment: Generalized weakness    Cervical / Trunk Assessment Cervical / Trunk Assessment:  (pt very thin, frail appearance, significant scoliosis with R thoracic concavity)  Communication      Cognition Arousal/Alertness:  Awake/alert Behavior During Therapy: WFL for tasks assessed/performed Overall Cognitive Status: Within Functional Limits for tasks assessed                                        General Comments General comments (skin integrity, edema, etc.): Max HR 130 bpm with gait    Exercises General Exercises - Lower Extremity Long Arc Quad: AROM;Strengthening;Both;10 reps;Seated Hip ABduction/ADduction: AROM;Strengthening;Both;10 reps;Seated (hip adduction pillow squeezes) Hip Flexion/Marching: AROM;Strengthening;Both;10 reps;Seated (more difficulty with RLE 2/2 weakness)   Assessment/Plan    PT Assessment Patient needs continued PT services  PT Problem List Decreased strength;Decreased mobility;Decreased activity tolerance;Decreased balance;Cardiopulmonary status limiting activity;Pain       PT Treatment Interventions DME instruction;Therapeutic activities;Modalities;Gait training;Therapeutic exercise;Patient/family education;Stair training;Balance training;Functional mobility training;Neuromuscular re-education;Manual techniques    PT Goals (Current goals can be found in the Care Plan section)  Acute Rehab PT Goals Patient Stated Goal: get better PT Goal Formulation: With patient Time For Goal Achievement: 08/13/20 Potential to Achieve Goals: Good    Frequency Min 2X/week   Barriers to discharge Inaccessible home environment multiple steps to access home & bed/bath on 2nd level    Co-evaluation               AM-PAC PT "6 Clicks" Mobility  Outcome Measure Help needed turning from your back to your side while in a flat bed without using bedrails?: None Help needed moving from lying on your back to sitting on the side of a flat bed without using bedrails?: A Little Help needed moving to and from a bed to a chair (including a wheelchair)?: A Little Help needed standing up from a chair using your arms (e.g., wheelchair or bedside chair)?: A Little Help needed to  walk in hospital room?: A Little Help needed climbing 3-5 steps with a railing? : A Little 6 Click Score: 19    End of Session Equipment Utilized During Treatment: Gait belt;Oxygen Activity Tolerance: Patient limited by fatigue;Other (comment) (limited by SOB) Patient left: in chair;with call bell/phone within reach;with chair alarm set;with family/visitor present Nurse Communication: Mobility status (SPO2 readings) PT Visit Diagnosis: Unsteadiness on feet (R26.81);Difficulty in walking, not elsewhere classified (R26.2);Muscle weakness (generalized) (M62.81)    Time: 6295-2841 PT Time Calculation (min) (ACUTE ONLY): 21 min   Charges:   PT Evaluation $PT Eval Low Complexity: 1 Low PT Treatments $Therapeutic Activity: 8-22 mins        [name], PT, DPT 07/30/20, 10:40 AM   [name] 07/30/2020, 10:23 AM

## 2020-07-31 ENCOUNTER — Encounter: Payer: Self-pay | Admitting: Internal Medicine

## 2020-07-31 DIAGNOSIS — J9601 Acute respiratory failure with hypoxia: Secondary | ICD-10-CM | POA: Diagnosis not present

## 2020-07-31 DIAGNOSIS — R630 Anorexia: Secondary | ICD-10-CM | POA: Diagnosis not present

## 2020-07-31 DIAGNOSIS — I509 Heart failure, unspecified: Secondary | ICD-10-CM | POA: Diagnosis not present

## 2020-07-31 LAB — BASIC METABOLIC PANEL
Anion gap: 11 (ref 5–15)
BUN: 23 mg/dL (ref 8–23)
CO2: 32 mmol/L (ref 22–32)
Calcium: 8.9 mg/dL (ref 8.9–10.3)
Chloride: 88 mmol/L — ABNORMAL LOW (ref 98–111)
Creatinine, Ser: 0.63 mg/dL (ref 0.61–1.24)
GFR, Estimated: >60 mL/min (ref >60)
Glucose, Bld: 117 mg/dL — ABNORMAL HIGH (ref 70–99)
Potassium: 3.1 mmol/L — ABNORMAL LOW (ref 3.5–5.1)
Sodium: 131 mmol/L — ABNORMAL LOW (ref 135–145)

## 2020-07-31 MED ORDER — POTASSIUM CHLORIDE CRYS ER 20 MEQ PO TBCR: 40.0000 meq | EXTENDED_RELEASE_TABLET | Freq: Once | ORAL | Status: DC

## 2020-07-31 NOTE — Consult Note (Signed)
Reason for Consult: L5 compression fracture Referring Physician: Dr. Laurena Bering Roger Gutierrez is an 80 y.o. male.  HPI: Patient reports about a 2-week history of severe pain.  He has pain in the low back that is made him need a walker to get around.  He feels like his pain is under fair control.  He denies prodromal symptoms.  Denies any bowel or bladder dysfunction.  Past Medical History:  Diagnosis Date  . Allergy   . Cataract    removed  . COPD (chronic obstructive pulmonary disease) (HCC)   . Hypertension   . Myocardial infarction (HCC)   . Osteoporosis     Past Surgical History:  Procedure Laterality Date  . ANGIOPLASTY     cardiac; 3 stents  . ankel fusion Left 1958  . EYE SURGERY Bilateral 08/2011 and 09/2011   cataract extraction  . TONSILLECTOMY AND ADENOIDECTOMY  1950's    Family History  Problem Relation Age of Onset  . Hypertension Mother   . Healthy Sister     Social History:  reports that he has been smoking cigars. He has never used smokeless tobacco. He reports current alcohol use. He reports that he does not use drugs.  Allergies:  Allergies  Allergen Reactions  . Megace  [Megestrol] Shortness Of Breath and Palpitations    Medications: I have reviewed the patient's current medications.  Results for orders placed or performed during the hospital encounter of 31-Jul-2020 (from the past 48 hour(s))  Basic metabolic panel     Status: Abnormal   Collection Time: 07/30/20  4:28 AM  Result Value Ref Range   Sodium 137 135 - 145 mmol/L   Potassium 3.7 3.5 - 5.1 mmol/L   Chloride 94 (L) 98 - 111 mmol/L   CO2 31 22 - 32 mmol/L   Glucose, Bld 97 70 - 99 mg/dL    Comment: Glucose reference range applies only to samples taken after fasting for at least 8 hours.   BUN 19 8 - 23 mg/dL   Creatinine, Ser 4.65 (L) 0.61 - 1.24 mg/dL   Calcium 9.0 8.9 - 03.5 mg/dL   GFR, Estimated >46 >56 mL/min    Comment: (NOTE) Calculated using the CKD-EPI Creatinine Equation (2021)     Anion gap 12 5 - 15    Comment: Performed at [facility], [address] [place], [place] [address]  Basic metabolic panel     Status: Abnormal   Collection Time: 07/31/20  5:56 AM  Result Value Ref Range   Sodium 131 (L) 135 - 145 mmol/L   Potassium 3.1 (L) 3.5 - 5.1 mmol/L   Chloride 88 (L) 98 - 111 mmol/L   CO2 32 22 - 32 mmol/L   Glucose, Bld 117 (H) 70 - 99 mg/dL    Comment: Glucose reference range applies only to samples taken after fasting for at least 8 hours.   BUN 23 8 - 23 mg/dL   Creatinine, Ser 1.70 0.61 - 1.24 mg/dL   Calcium 8.9 8.9 - 01.7 mg/dL   GFR, Estimated >49 >44 mL/min    Comment: (NOTE) Calculated using the CKD-EPI Creatinine Equation (2021)    Anion gap 11 5 - 15    Comment: Performed at [facility], [address], [place], [place] [address]    CT Angio Chest/Abd/Pel for Dissection W and/or W/WO  Result Date: 07/29/2020 CLINICAL DATA:  80 year old male with suspected thoracic aortic aneurysm. EXAM: CT ANGIOGRAPHY CHEST, ABDOMEN AND PELVIS TECHNIQUE: A  noncontrast chest CT was initially performed. Multidetector CT imaging through the chest, abdomen and pelvis was performed using the standard protocol during bolus administration of intravenous contrast. Multiplanar reconstructed images and MIPs were obtained and reviewed to evaluate the vascular anatomy. CONTRAST:  62mL OMNIPAQUE IOHEXOL 350 MG/ML SOLN COMPARISON:  CT lumbar spine from 07/15/2020, CT abdomen pelvis from 04/12/2009 FINDINGS: CTA CHEST FINDINGS VASCULAR Preferential opacification of the thoracic aorta. No evidence of thoracic aortic aneurysm or dissection. Scattered atherosclerotic calcifications. Normal heart size. No pericardial effusion. Sinues of Valsalva: 35 mm 36 x 34 mm Sinotubular Junction: 34 mm Ascending Aorta: 38 mm Aortic Arch: 25 mm Descending aorta: 25 mm at the level of the carina Branch vessels: Conventional branching pattern. No significant atherosclerotic  changes. Coronary arteries: Normal origins and courses. Severe atherosclerotic calcifications with proximal LAD stent in place. Main pulmonary artery: 27 mm. No evidence of central pulmonary embolism. Pulmonary veins: No anomalous pulmonary venous return. No evidence of left atrial appendage thrombus. Review of the MIP images confirms the above findings. NON VASCULAR Mediastinum/Nodes: No enlarged mediastinal, hilar, or axillary lymph nodes. Thyroid gland, trachea, and esophagus demonstrate no significant findings. Lungs/Pleura: Secretions are noted within the carina extending into the left mainstem and lobar bronchi. Severe centrilobular and paraseptal emphysema with multifocal bullous changes in the lingula and right middle lobe. No focal consolidations or suspicious pulmonary nodules. 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 Diffuse osteopenia. No acute osseous abnormality. CTA ABDOMEN AND PELVIS FINDINGS VASCULAR Aorta: Caliber and patent throughout with coarse circumferential fibrofatty and calcific atherosclerotic changes in the infrarenal portion. Celiac: Patent without evidence of aneurysm, dissection, vasculitis or significant stenosis. SMA: Patent without evidence of aneurysm, dissection, vasculitis or significant stenosis. Renals: Single bilateral renal arteries are patent without evidence of aneurysm, dissection, vasculitis, fibromuscular dysplasia or significant stenosis. IMA: Patent without evidence of aneurysm, dissection, vasculitis or significant stenosis. Inflow: Scattered, nearly circumferential calcific atherosclerotic calcifications. At least moderate focal stenosis about the proximal left external iliac artery secondary to atherosclerotic plaque. Veins: No obvious venous abnormality within the limitations of this arterial phase study. Review of the MIP images confirms the above findings. NON-VASCULAR  Hepatobiliary: No focal liver abnormality is seen. No gallstones, gallbladder wall thickening, or biliary dilatation. Pancreas: Unremarkable. No pancreatic ductal dilatation or surrounding inflammatory changes. Spleen: Normal in size without focal abnormality. Adrenals/Urinary Tract: Adrenal glands are not definitively visualized. Focal cortical thinning about the anterior aspect of the left inferior pole renal cortex. Kidneys are otherwise normal, without renal calculi, focal lesion, or hydronephrosis. Bladder is unremarkable. Stomach/Bowel: Stomach is within normal limits. Appendix is not definitively visualized. Moderate colonic stool burden. No evidence of bowel wall thickening, distention, or inflammatory changes. Lymphatic: No abdominopelvic lymphadenopathy. Reproductive: Prostate is unremarkable. Other: No abdominal wall hernia or abnormality. No abdominopelvic ascites. Musculoskeletal: Interval development of compression deformity of the L5 vertebral body with approximately 60% vertebral body height loss. No evidence of significant retropulsion. Diffuse osteopenia. Similar appearing sigmoid curvature of the thoracolumbar spine. IMPRESSION: VASCULAR 1. No evidence of thoracic or abdominal aortic aneurysm. 2.  Aortic Atherosclerosis (ICD10-I70.0). NON VASCULAR 1. Acute to subacute compression fracture of the L5 vertebral body with approximately 60% height loss. No evidence of significant retropulsion. Consider MRI lumbar spine for further characterization. 2. Moderate colonic stool burden. 3. Secretions noted in the distal trachea and left proximal bronchi. No definite evidence of pneumonia. 4. Severe emphysema (ICD10-J43.9). These results will be called to  the ordering clinician or representative by the Radiologist Assistant, and communication documented in the PACS or [employer]. Marliss Coots, MD [facility] Radiology Electronically Signed   By: Marliss Coots MD   On: 07/29/2020 14:59    Review of Systems Blood pressure (!) 133/95, pulse 99, temperature 98.4 F (36.9 C), temperature source Oral, resp. rate 17, height 5\' 5"  (1.651 m), weight 40.5 kg, SpO2 94 %. 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  Skin is intact.  Prominent spinous process at L5.  No clonus or neurodeficit. Assessment/Plan: L5 compression fracture. I discussed kyphoplasty procedure with him.  He thinks he may be discharged home today and I told him we can follow-up in a week and if his pain is not better perform kyphoplasty as an outpatient at that time.  07/31/2020, 12:24 PM

## 2020-07-31 NOTE — Consult Note (Addendum)
   Heart Failure Nurse Navigator Note  HFpEF 55-60%.  Grade 1 diastolic dysfunction.  Normal right ventricular systolic function.  Presented from home with complaints of shortness of breath, wheeze, and cough x1 week.    Comorbidities:  COPD Hypertension Coronary artery disease/stenting  Continued tobacco abuse  Medications:  Aspirin 81 mg daily Furosemide 40 mg IV 2 times a day Pravastatin 80 mg daily Ramipril 5 mg daily  Labs:  Sodium 131, potassium 3.1, chloride 88, CO2 32, BUN 23, creatinine 0.63, BNP on admission was 185, albumin 2.7 Intake 870 cc Output 225 cc  Assessment:  General- frail gentleman lying in bed in no acute distress.  HEENT- pupils are equal, non icteric  Cardiac- is of regular rate and rhythm no murmurs rubs or gallops appreciated.  Chest- clear to anterior auscultation  Abdomen- flat/soft nontender  Musculoskeletal- ankle edema, 1+  Psych-is pleasant and appropriate makes eye contact.  Neurologic-speech is clear   Initial meeting with patient.  He states that he lives at home with his wife son and daughter-in-law.  Son and daughter-in-law are the ones that do the cooking.  He states that he occasionally will add salt at the table.  He states that he came in with shortness of breath but denied any PND or orthopnea prior to admission.  He states that he has had not much of an appetite, currently his lunch tray is sitting on the bedside table he is not touched and he states that he is not hungry.  And asked how much liquids he drinks throughout the day, he estimated approximately 30 ounces.  Discussed weighing daily and recording.  Also discussed symptoms to report to physician.  Went over living with heart failure teaching booklet.  Also discussed that outpatient heart failure clinic.  Question if edema is more due to low albumin.  His symptoms of weakness and no appetite go along with that.   Tresa Endo RN [facility]

## 2020-07-31 NOTE — TOC Progression Note (Signed)
Transition of Care [facility]) - Progression Note    Patient Details  Name: Roger Gutierrez MRN: [id number] Date of Birth: Apr 01, 1940  [facility] CM/SW Contact  Hetty Ely, RN Phone Number: 07/31/2020, 4:25 PM  Clinical Narrative: Called patient daughter, Roger Gutierrez to discuss health care power of attorney paperwork. This is done in the presence of Equities trader, daughter voices understanding. Referal placed for Chaplain to call and schedule visit to have paperwork completed.           Expected Discharge Plan and Services                                                 Social Determinants of Health (SDOH) Interventions    Readmission Risk Interventions No flowsheet data found.

## 2020-07-31 NOTE — Progress Notes (Signed)
[facility]                                                                              Patient Demographics  Roger Gutierrez, is a 80 y.o. male, DOB - 1940-04-21, ZOX:[id number]  Admit date - 08-24-20   Admitting Physician Rometta Emery, MD  Outpatient Primary MD for the patient is Fisher, Demetrios Isaacs, MD  Outpatient specialists:   LOS - 3  days   Medical records reviewed and are as summarized below:    Chief Complaint  Patient presents with  . Failure To Thrive  . Weakness       Brief summary   Patient is a 80 year old male with history of COPD, ongoing tobacco use, hypertension, CAD status post prior stents 10 years ago, osteoporosis, scoliosis, chronic back pain presented to ED with progressive shortness of breath, coughing and wheezing for a week.  Patient also had generalized weakness, leg swelling.   Chest x-ray showed cardiomegaly with bibasilar atelectasis left greater than right, small bilateral pleural effusions.  Patient was admitted for acute CHF exacerbation.  BNP 185.  O2 sats 92% on 2 L   Assessment & Plan    Principal Problem:   Acute exacerbation of diastolic CHF (congestive heart failure) (HCC) -Underlying history of COPD, smoking, hypertension, CAD -Currently on IV Lasix 40 mg every 12 hours, negative balance of 4.6 L  -2D echo showed EF of 55 to 60% with grade 1 DD, right ventricular function normal, moderate to marked aortic dilation.  CTA chest abdomen pelvis showed no aortic dilatation. -Continue ramipril 5 mg daily.  Cardiology following   Active Problems: L5 vertebral body compression fracture -Incidentally seen on CT chest abdomen pelvis, acute to subacute fracture of L5 vertebral body with approximately 60% height loss -Patient reports pain in the lower lumbar region, PT recommended SNF -Dr Rosita Kea was consulted, patient told him that he is being discharged and was recommended follow-up in 1 week, outpatient kyphoplasty.     History of coronary artery disease status post PCI -No chest pain, continue aspirin, Lasix, statin, ramipril     Hypercholesteremia -Continue statin    Benign hypertension -Stable, continue Lasix, ramipril    Tobacco abuse -Counseled on nicotine cessation    COPD (chronic obstructive pulmonary disease) (HCC) with mild exacerbation -Continue nebs  Scoliosis with chronic back pain Continue home pain regimen  Anorexia with hypoalbuminemia, cachectic appearance Estimated body mass index is 14.86 kg/m as calculated from the following:   Height as of this encounter:  (1.651 m).   Weight as of this encounter: 40.5 kg.  -Registered dietitian consulted, placed on nutritional supplements -sedentary life style per daughter, doesn't eat much    Goals of care  - discussed with daughter, she is okay with outpatient kyphoplasty and insisted on SNF as patient is not able to care for himself. Lives with wife who has dementia and has full time care giver.    Code Status full code DVT Prophylaxis:  enoxaparin (LOVENOX) injection 40 mg Start: 07/29/20 0800   Level of Care: Level of care: Progressive Cardiac Family Communication: Discussed all imaging results, lab results,  explained to the patient and patient's daughter, Hipolito Martinezlopez on phone [phone]    Disposition Plan:     Status is: Inpatient  Remains inpatient appropriate because:Inpatient level of care appropriate due to severity of illness   Dispo: The patient is from: Home              Anticipated d/c is to: SNF              Patient currently is not medically stable to d/c.  On IV Lasix diuresis   Difficult to place patient No   Time Spent in minutes 25 mins   Procedures:  2D echo   Consultants:   cardiology  Antimicrobials:   Anti-infectives (From admission, onward)   None         Medications  Scheduled Meds: . aspirin EC  81 mg Oral Daily  . DULoxetine  30 mg Oral Daily  . enoxaparin (LOVENOX)  injection  40 mg Subcutaneous Q24H  . feeding supplement  237 mL Oral TID BM  . furosemide  40 mg Intravenous BID  . potassium chloride  40 mEq Oral Once  . pravastatin  80 mg Oral Daily  . ramipril  5 mg Oral Daily  . sodium chloride flush  3 mL Intravenous Q12H   Continuous Infusions: . sodium chloride     PRN Meds:.sodium chloride, acetaminophen, albuterol, cyclobenzaprine, ibuprofen, ondansetron (ZOFRAN) IV, oxyCODONE-acetaminophen, sodium chloride flush      Subjective:   Roger Gutierrez was seen and examined today.  Shortness of breath is improving, patient asking when he can go home.  Still has some back pain, states 3/10.  No acute events overnight.  No lower extremity weakness, numbness or tingling.  No nausea vomiting or diarrhea.   Objective:   Vitals:   07/30/20 1618 07/30/20 2019 07/31/20 0500 07/31/20 0900  BP:  (!) 133/95  138/88  Pulse:  99    Resp:  17    Temp:  98.4 F (36.9 C)  98.6 F (37 C)  TempSrc:  Oral    SpO2: 90% 94%  92%  Weight:   40.5 kg   Height:        Intake/Output Summary (Last 24 hours) at 07/31/2020 1557 Last data filed at 07/31/2020 1400 Gross per 24 hour  Intake 150 ml  Output 2025 ml  Net -1875 ml     Wt Readings from Last 3 Encounters:  07/31/20 40.5 kg  07/15/20 47.6 kg  03/03/20 46.7 kg   Physical Exam  General: Alert and oriented x 3, NAD, cachectic appearing  Cardiovascular: S1 S2 clear, RRR.  1+ pedal edema b/l  Respiratory: Diminished breath sounds bases  Gastrointestinal: Soft, nontender, nondistended, NBS  Ext: 1+ pedal edema bilaterally  Neuro: no new deficits  Musculoskeletal: No cyanosis, clubbing  Skin: No rashes  Psych: flat affect   Data Reviewed:  I have personally reviewed following labs and imaging studies  Micro Results Recent Results (from the past 240 hour(s))  Resp Panel by RT-PCR (Flu A&B, Covid) Nasopharyngeal Swab     Status: None   Collection Time: Jul 31, 2020  8:34 PM   Specimen:  Nasopharyngeal Swab; Nasopharyngeal(NP) swabs in vial transport medium  Result Value Ref Range Status   SARS Coronavirus 2 by RT PCR NEGATIVE NEGATIVE Final    Comment: (NOTE) SARS-CoV-2 target nucleic acids are NOT DETECTED.  The SARS-CoV-2 RNA is generally detectable in upper respiratory specimens during the acute phase of infection. The lowest concentration of SARS-CoV-2 viral  copies this assay can detect is 138 copies/mL. A negative result does not preclude SARS-Cov-2 infection and should not be used as the sole basis for treatment or other patient management decisions. A negative result may occur with  improper specimen collection/handling, submission of specimen other than nasopharyngeal swab, presence of viral mutation(s) within the areas targeted by this assay, and inadequate number of viral copies(<138 copies/mL). A negative result must be combined with clinical observations, patient history, and epidemiological information. The expected result is Negative.  Fact Sheet for Patients:  BloggerCourse.com  Fact Sheet for Healthcare Providers:  SeriousBroker.it  This test is no t yet approved or cleared by the [place] FDA and  has been authorized for detection and/or diagnosis of SARS-CoV-2 by FDA under an Emergency Use Authorization (EUA). This EUA will remain  in effect (meaning this test can be used) for the duration of the COVID-19 declaration under Section 564(b)(1) of the Act, 21 U.S.C.section 360bbb-3(b)(1), unless the authorization is terminated  or revoked sooner.       Influenza A by PCR NEGATIVE NEGATIVE Final   Influenza B by PCR NEGATIVE NEGATIVE Final    Comment: (NOTE) The Xpert Xpress SARS-CoV-2/FLU/RSV plus assay is intended as an aid in the diagnosis of influenza from Nasopharyngeal swab specimens and should not be used as a sole basis for treatment. Nasal washings and aspirates are unacceptable for  Xpert Xpress SARS-CoV-2/FLU/RSV testing.  Fact Sheet for Patients: BloggerCourse.com  Fact Sheet for Healthcare Providers: SeriousBroker.it  This test is not yet approved or cleared by the [place] FDA and has been authorized for detection and/or diagnosis of SARS-CoV-2 by FDA under an Emergency Use Authorization (EUA). This EUA will remain in effect (meaning this test can be used) for the duration of the COVID-19 declaration under Section 564(b)(1) of the Act, 21 U.S.C. section 360bbb-3(b)(1), unless the authorization is terminated or revoked.  Performed at [facility], [address] [place], [place] [address]     Radiology Reports DG Chest 2 View  Result Date: 08/04/2020 CLINICAL DATA:  Generalized weakness and lethargy. EXAM: CHEST - 2 VIEW COMPARISON:  November 10, 2012 FINDINGS: Chronic appearing increased lung markings are seen with mild to moderate severity atelectasis and/or infiltrate noted within the left lung base. Mild right basilar atelectasis and/or early infiltrate is also seen. Very small bilateral pleural effusions are noted. No pneumothorax is identified. The cardiac silhouette is moderately enlarged. Marked severity S-shaped scoliosis of the thoracolumbar spine is seen. IMPRESSION: 1. Cardiomegaly with bibasilar atelectasis and/or infiltrate, left greater than right. 2. Very small bilateral pleural effusions. Electronically Signed   By: Aram Candela M.D.   On: 08/08/2020 18:00   DG Lumbar Spine 2-3 Views  Result Date: 07/15/2020 CLINICAL DATA:  Pain after lifting injury 2 days ago. Pain radiates to the left side. EXAM: LUMBAR SPINE - 2-3 VIEW COMPARISON:  None. FINDINGS: No visible fracture, erosion, or bone lesion. Marked lumbar levoscoliosis. Generalized osteopenia. Prominent arterial calcification of the aorta. IMPRESSION: No acute finding, although sensitivity is degraded by osteopenia and marked  levoscoliosis Electronically Signed   By: Marnee Spring M.D.   On: 07/15/2020 11:05   CT Lumbar Spine Wo Contrast  Result Date: 07/15/2020 CLINICAL DATA:  Low back pain.  Increased fracture risk. EXAM: CT LUMBAR SPINE WITHOUT CONTRAST TECHNIQUE: Multidetector CT imaging of the lumbar spine was performed without intravenous contrast administration. Multiplanar CT image reconstructions were also generated. COMPARISON:  Lumbar spine radiographs 07/15/2020. CT abdomen and pelvis  04/12/2009. FINDINGS: Segmentation: 5 lumbar type vertebrae. Alignment: Severe levoscoliosis with apex at L3. No significant listhesis in the sagittal plane. Vertebrae: Diffuse osteopenia. No fracture or destructive osseous process. Subcentimeter sclerotic focus in the L3 vertebral body, also present in 2011 and likely reflecting a bone island. Paraspinal and other soft tissues: Extensive abdominal aortic atherosclerosis without aneurysm. Asymmetric right-sided paraspinal muscle atrophy. Disc levels: Advanced asymmetric right-sided disc space narrowing at L3-4 with endplate and facet spurring resulting in moderate right neural foraminal stenosis. Moderate right neural foraminal stenosis at L4-5 and mild-to-moderate left neural foraminal stenosis at L5-S1 due to disc bulging and facet spurring. No evidence of significant lumbar spinal canal stenosis. IMPRESSION: 1. No evidence of acute osseous abnormality. 2. Severe lumbar levoscoliosis. 3. Moderate right neural foraminal stenosis at L3-4 and L4-5. No evidence of significant spinal stenosis. 4. Aortic Atherosclerosis (ICD10-I70.0). Electronically Signed   By: Sebastian Ache M.D.   On: 07/15/2020 12:36   US Venous Img Lower Bilateral (DVT)  Result Date: 07/29/2020 CLINICAL DATA:  80 year old male with bilateral lower extremity edema for the past week EXAM: BILATERAL LOWER EXTREMITY VENOUS DOPPLER ULTRASOUND TECHNIQUE: Gray-scale sonography with graded compression, as well as color Doppler  and duplex ultrasound were performed to evaluate the lower extremity deep venous systems from the level of the common femoral vein and including the common femoral, femoral, profunda femoral, popliteal and calf veins including the posterior tibial, peroneal and gastrocnemius veins when visible. The superficial great saphenous vein was also interrogated. Spectral Doppler was utilized to evaluate flow at rest and with distal augmentation maneuvers in the common femoral, femoral and popliteal veins. COMPARISON:  None. FINDINGS: RIGHT LOWER EXTREMITY Common Femoral Vein: No evidence of thrombus. Normal compressibility, respiratory phasicity and response to augmentation. Saphenofemoral Junction: No evidence of thrombus. Normal compressibility and flow on color Doppler imaging. Profunda Femoral Vein: No evidence of thrombus. Normal compressibility and flow on color Doppler imaging. Femoral Vein: No evidence of thrombus. Normal compressibility, respiratory phasicity and response to augmentation. Popliteal Vein: No evidence of thrombus. Normal compressibility, respiratory phasicity and response to augmentation. Calf Veins: No evidence of thrombus. Normal compressibility and flow on color Doppler imaging. Superficial Great Saphenous Vein: No evidence of thrombus. Normal compressibility. Venous Reflux:  None. Other Findings:  None. LEFT LOWER EXTREMITY Common Femoral Vein: No evidence of thrombus. Normal compressibility, respiratory phasicity and response to augmentation. Saphenofemoral Junction: No evidence of thrombus. Normal compressibility and flow on color Doppler imaging. Profunda Femoral Vein: No evidence of thrombus. Normal compressibility and flow on color Doppler imaging. Femoral Vein: No evidence of thrombus. Normal compressibility, respiratory phasicity and response to augmentation. Popliteal Vein: No evidence of thrombus. Normal compressibility, respiratory phasicity and response to augmentation. Calf Veins: No  evidence of thrombus. Normal compressibility and flow on color Doppler imaging. Superficial Great Saphenous Vein: No evidence of thrombus. Normal compressibility. Venous Reflux:  None. Other Findings:  None. IMPRESSION: No evidence of deep venous thrombosis in either lower extremity. Electronically Signed   By: Malachy Moan M.D.   On: 07/29/2020 13:34   ECHOCARDIOGRAM COMPLETE  Result Date: 07/29/2020    ECHOCARDIOGRAM REPORT   Patient Name:   Roger Gutierrez Date of Exam: 07/29/2020 Medical Rec #:  [id number]      Height:       65.0 in Accession #:    [id number]     Weight:       100.3 lb Date of Birth:  June 17, 1940      BSA:  1.475 m Patient Age:    80 years       BP:           133/93 mmHg Patient Gender: M              HR:           94 bpm. Exam Location:  [facility] Procedure: 2D Echo Indications:     Diastolic CHF 150.31  History:         Patient has no prior history of Echocardiogram examinations.                  CAD, COPD; Risk Factors:Hypertension.  Sonographer:     L Thornton-Maynard Referring Phys:  [id number] Rometta EmeryMOHAMMAD L GARBA Diagnosing Phys: Kristeen MissPhilip Nahser MD  Sonographer Comments: Image acquisition challenging due to patient body habitus. IMPRESSIONS  1. Left ventricular ejection fraction, by estimation, is 55 to 60%. The left ventricle has normal function. The left ventricle has no regional wall motion abnormalities. Left ventricular diastolic parameters are consistent with Grade I diastolic dysfunction (impaired relaxation).  2. Right ventricular systolic function is normal. The right ventricular size is normal.  3. The mitral valve was not well visualized. No evidence of mitral valve regurgitation.  4. The aortic valve is grossly normal. Aortic valve regurgitation is not visualized. No aortic stenosis is present.  5. The echo is technically difficult and the aorta is not seen very well but it appears to be moderately - markedly dilated. Suggest CTA for better imaging of the aorta . Marland Kitchen. Aortic dilatation  noted. There is moderate dilatation of the aortic root, measuring 46 mm. FINDINGS  Left Ventricle: Left ventricular ejection fraction, by estimation, is 55 to 60%. The left ventricle has normal function. The left ventricle has no regional wall motion abnormalities. The left ventricular internal cavity size was normal in size. There is  no left ventricular hypertrophy. Left ventricular diastolic parameters are consistent with Grade I diastolic dysfunction (impaired relaxation). Right Ventricle: The right ventricular size is normal. No increase in right ventricular wall thickness. Right ventricular systolic function is normal. Left Atrium: Left atrial size was normal in size. Right Atrium: Right atrial size was normal in size. Pericardium: There is no evidence of pericardial effusion. Mitral Valve: The mitral valve was not well visualized. No evidence of mitral valve regurgitation. MV peak gradient, 3.4 mmHg. The mean mitral valve gradient is 2.0 mmHg. Tricuspid Valve: The tricuspid valve is not well visualized. Tricuspid valve regurgitation is not demonstrated. Aortic Valve: The aortic valve is grossly normal. Aortic valve regurgitation is not visualized. No aortic stenosis is present. Aortic valve mean gradient measures 2.0 mmHg. Aortic valve peak gradient measures 4.0 mmHg. Aortic valve area, by VTI measures 5.61 cm. Pulmonic Valve: The pulmonic valve was not well visualized. Pulmonic valve regurgitation is not visualized. Aorta: The echo is technically difficult and the aorta is not seen very well but it appears to be moderately - markedly dilated. Suggest CTA for better imaging of the aorta. Aortic dilatation noted. There is moderate dilatation of the aortic root, measuring 46 mm. IAS/Shunts: The atrial septum is grossly normal.  LEFT VENTRICLE PLAX 2D LVIDd:         3.94 cm  Diastology LVIDs:         2.74 cm  LV e' medial:    4.35 cm/s LV PW:         0.92 cm  LV E/e' medial:  10.0 LV IVS:  1.20 cm  LV e'  lateral:   5.87 cm/s LVOT diam:     2.50 cm  LV E/e' lateral: 7.4 LV SV:         90 LV SV Index:   61 LVOT Area:     4.91 cm  RIGHT VENTRICLE RV S prime:     15.80 cm/s TAPSE (M-mode): 3.0 cm LEFT ATRIUM             Index LA diam:        2.90 cm 1.97 cm/m LA Vol (A2C):   30.6 ml 20.74 ml/m LA Vol (A4C):   48.8 ml 33.08 ml/m LA Biplane Vol: 38.8 ml 26.30 ml/m  AORTIC VALVE                   PULMONIC VALVE AV Area (Vmax):    5.01 cm    PV Vmax:       0.98 m/s AV Area (Vmean):   5.13 cm    PV Peak grad:  3.8 mmHg AV Area (VTI):     5.61 cm AV Vmax:           100.00 cm/s AV Vmean:          70.400 cm/s AV VTI:            0.160 m AV Peak Grad:      4.0 mmHg AV Mean Grad:      2.0 mmHg LVOT Vmax:         102.00 cm/s LVOT Vmean:        73.600 cm/s LVOT VTI:          0.183 m LVOT/AV VTI ratio: 1.14  AORTA Ao Root diam: 4.60 cm MITRAL VALVE MV Area VTI:  5.87 cm     SHUNTS MV Peak grad: 3.4 mmHg     Systemic VTI:  0.18 m MV Mean grad: 2.0 mmHg     Systemic Diam: 2.50 cm MV Vmax:      0.93 m/s MV Vmean:     60.4 cm/s MV E velocity: 43.30 cm/s MV A velocity: 78.00 cm/s MV E/A ratio:  0.56 Kristeen Miss MD Electronically signed by Kristeen Miss MD Signature Date/Time: 07/29/2020/9:06:01 AM    Final    CT Angio Chest/Abd/Pel for Dissection W and/or W/WO  Result Date: 07/29/2020 CLINICAL DATA:  80 year old male with suspected thoracic aortic aneurysm. EXAM: CT ANGIOGRAPHY CHEST, ABDOMEN AND PELVIS TECHNIQUE: A noncontrast chest CT was initially performed. Multidetector CT imaging through the chest, abdomen and pelvis was performed using the standard protocol during bolus administration of intravenous contrast. Multiplanar reconstructed images and MIPs were obtained and reviewed to evaluate the vascular anatomy. CONTRAST:  75mL OMNIPAQUE IOHEXOL 350 MG/ML SOLN COMPARISON:  CT lumbar spine from 07/15/2020, CT abdomen pelvis from 04/12/2009 FINDINGS: CTA CHEST FINDINGS VASCULAR Preferential opacification of the thoracic  aorta. No evidence of thoracic aortic aneurysm or dissection. Scattered atherosclerotic calcifications. Normal heart size. No pericardial effusion. Sinues of Valsalva: 35 mm 36 x 34 mm Sinotubular Junction: 34 mm Ascending Aorta: 38 mm Aortic Arch: 25 mm Descending aorta: 25 mm at the level of the carina Branch vessels: Conventional branching pattern. No significant atherosclerotic changes. Coronary arteries: Normal origins and courses. Severe atherosclerotic calcifications with proximal LAD stent in place. Main pulmonary artery: 27 mm. No evidence of central pulmonary embolism. Pulmonary veins: No anomalous pulmonary venous return. No evidence of left atrial appendage thrombus. Review of the MIP images confirms the above findings. NON VASCULAR  Mediastinum/Nodes: No enlarged mediastinal, hilar, or axillary lymph nodes. Thyroid gland, trachea, and esophagus demonstrate no significant findings. Lungs/Pleura: Secretions are noted within the carina extending into the left mainstem and lobar bronchi. Severe centrilobular and paraseptal emphysema with multifocal bullous changes in the lingula and right middle lobe. No focal consolidations or suspicious pulmonary nodules. 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 Diffuse osteopenia. No acute osseous abnormality. CTA ABDOMEN AND PELVIS FINDINGS VASCULAR Aorta: Caliber and patent throughout with coarse circumferential fibrofatty and calcific atherosclerotic changes in the infrarenal portion. Celiac: Patent without evidence of aneurysm, dissection, vasculitis or significant stenosis. SMA: Patent without evidence of aneurysm, dissection, vasculitis or significant stenosis. Renals: Single bilateral renal arteries are patent without evidence of aneurysm, dissection, vasculitis, fibromuscular dysplasia or significant stenosis. IMA: Patent without evidence of aneurysm, dissection, vasculitis or  significant stenosis. Inflow: Scattered, nearly circumferential calcific atherosclerotic calcifications. At least moderate focal stenosis about the proximal left external iliac artery secondary to atherosclerotic plaque. Veins: No obvious venous abnormality within the limitations of this arterial phase study. Review of the MIP images confirms the above findings. NON-VASCULAR Hepatobiliary: No focal liver abnormality is seen. No gallstones, gallbladder wall thickening, or biliary dilatation. Pancreas: Unremarkable. No pancreatic ductal dilatation or surrounding inflammatory changes. Spleen: Normal in size without focal abnormality. Adrenals/Urinary Tract: Adrenal glands are not definitively visualized. Focal cortical thinning about the anterior aspect of the left inferior pole renal cortex. Kidneys are otherwise normal, without renal calculi, focal lesion, or hydronephrosis. Bladder is unremarkable. Stomach/Bowel: Stomach is within normal limits. Appendix is not definitively visualized. Moderate colonic stool burden. No evidence of bowel wall thickening, distention, or inflammatory changes. Lymphatic: No abdominopelvic lymphadenopathy. Reproductive: Prostate is unremarkable. Other: No abdominal wall hernia or abnormality. No abdominopelvic ascites. Musculoskeletal: Interval development of compression deformity of the L5 vertebral body with approximately 60% vertebral body height loss. No evidence of significant retropulsion. Diffuse osteopenia. Similar appearing sigmoid curvature of the thoracolumbar spine. IMPRESSION: VASCULAR 1. No evidence of thoracic or abdominal aortic aneurysm. 2.  Aortic Atherosclerosis (ICD10-I70.0). NON VASCULAR 1. Acute to subacute compression fracture of the L5 vertebral body with approximately 60% height loss. No evidence of significant retropulsion. Consider MRI lumbar spine for further characterization. 2. Moderate colonic stool burden. 3. Secretions noted in the distal trachea and left  proximal bronchi. No definite evidence of pneumonia. 4. Severe emphysema (ICD10-J43.9). These results will be called to the ordering clinician or representative by the Radiologist Assistant, and communication documented in the PACS or [employer]. Marliss Coots, MD [facility] Radiology Electronically Signed   By: Marliss Coots MD   On: 07/29/2020 14:59    Lab Data:  CBC: Recent Labs  Lab 08/14/2020 1724  WBC 11.5*  NEUTROABS 9.3*  HGB 14.8  HCT 43.0  MCV 92.9  PLT 444*   Basic Metabolic Panel: Recent Labs  Lab 08/01/2020 1724 07/29/20 0436 07/30/20 0428 07/31/20 0556  NA 132* 132* 137 131*  K 4.1 3.3* 3.7 3.1*  CL 95* 92* 94* 88*  CO2 26 29 31  32  GLUCOSE 105* 78 97 117*  BUN 17 17 19 23   CREATININE 0.63 0.65 0.54* 0.63  CALCIUM 9.1 8.5* 9.0 8.9  MG 1.7  --   --   --    GFR: Estimated Creatinine Clearance: 43.6 mL/min (by C-G formula based on SCr of 0.63 mg/dL). Liver Function Tests: Recent Labs  Lab 08/17/2020 1724  AST 17  ALT 15  ALKPHOS 86  BILITOT 1.1  PROT 5.6*  ALBUMIN 2.7*   No results for input(s): LIPASE, AMYLASE in the last 168 hours. No results for input(s): AMMONIA in the last 168 hours. Coagulation Profile: No results for input(s): INR, PROTIME in the last 168 hours. Cardiac Enzymes: No results for input(s): CKTOTAL, CKMB, CKMBINDEX, TROPONINI in the last 168 hours. BNP (last 3 results) No results for input(s): PROBNP in the last 8760 hours. HbA1C: No results for input(s): HGBA1C in the last 72 hours. CBG: No results for input(s): GLUCAP in the last 168 hours. Lipid Profile: No results for input(s): CHOL, HDL, LDLCALC, TRIG, CHOLHDL, LDLDIRECT in the last 72 hours. Thyroid Function Tests: No results for input(s): TSH, T4TOTAL, FREET4, T3FREE, THYROIDAB in the last 72 hours. Anemia Panel: No results for input(s): VITAMINB12, FOLATE, FERRITIN, TIBC, IRON, RETICCTPCT in the last 72 hours. Urine  analysis:    Component Value Date/Time   COLORURINE YELLOW (A) 07/27/2020 1702   APPEARANCEUR HAZY (A) 07/26/2020 1702   LABSPEC 1.019 08/13/2020 1702   PHURINE 5.0 08/01/2020 1702   GLUCOSEU NEGATIVE 08/13/2020 1702   HGBUR NEGATIVE 07/30/2020 1702   BILIRUBINUR NEGATIVE 08/03/2020 1702   KETONESUR 20 (A) 08/22/2020 1702   PROTEINUR NEGATIVE 08/21/2020 1702   NITRITE NEGATIVE 08/12/2020 1702   LEUKOCYTESUR NEGATIVE 07/27/2020 1702     Tessa Seaberry M.D. [facility] 07/31/2020, 3:57 PM  Available via Epic secure chat 7am-7pm After 7 pm, please refer to night coverage provider listed on amion.

## 2020-07-31 NOTE — Care Management Important Message (Signed)
Important Message  Patient Details  Name: Roger Gutierrez MRN: [id number] Date of Birth: 1940/10/15   Medicare Important Message Given:  Yes     Johnell Comings 07/31/2020, 12:03 PM

## 2020-07-31 NOTE — Progress Notes (Signed)
Physical Therapy Treatment Patient Details Name: Roger Gutierrez MRN: [id number] DOB: [birth year] Today's Date: 07/31/2020    History of Present Illness Pt is a 80 y/o M who presented to the ED with progressive SOB, cough & wheezing for over a week. Chest x-ray showed cardiomegaly with bibasilar atelectasis left greater than right, small bilateral pleural effusions.  Patient was admitted for acute CHF exacerbation. PMH: COPD, ongoing tobacco abuse, HTN, CAD, osteoporosis, cardiac stents about 10 years ago, scoliosis with chronic back pain, MI    PT Comments    Pt seen for skilled PT treatment. Pt noted to have SpO2 <90% on room air so placed on 2L/min & nurse made aware. Pt is able to complete bed mobility & transfers with mod I. Pt is able to ambulate twice with increased distances compared to yesterday (60 ft + 76 ft) with RW & supervision. Pt is unable to recall exercises from yesterday & requires max cuing to perform BLE strengthening exercises. Pt reports his wife has dementia but his son & daughter & law can provide assistance to him & his wife & he can sleep on first floor of home until he becomes stronger & can negotiate flight of stairs to access bedroom on 2nd level; due to this & pt's improving functional mobility will upgrade d/c recommendations to HHPT with supervision for OOB/mobility. Will continue to follow pt acutely to progress gait with LRAD, for stair negotiation, & strengthening.    Follow Up Recommendations  Home health PT;Supervision for mobility/OOB     Equipment Recommendations  None recommended by PT    Recommendations for Other Services       Precautions / Restrictions Precautions Precautions: Fall Restrictions Weight Bearing Restrictions: No    Mobility  Bed Mobility Overal bed mobility: Modified Independent       Supine to sit: Modified independent (Device/Increase time);HOB elevated          Transfers Overall transfer level: Modified  independent Equipment used: Rolling walker (2 wheeled) Transfers: Sit to/from Stand Sit to Stand: Modified independent (Device/Increase time)            Ambulation/Gait Ambulation/Gait assistance: Supervision Gait Distance (Feet): 60 Feet (+ 76 ft) Assistive device: Rolling walker (2 wheeled) Gait Pattern/deviations: Decreased step length - left;Decreased step length - right;Decreased stride length;Trunk flexed Gait velocity: decreased       Stairs             Wheelchair Mobility    Modified Rankin (Stroke Patients Only)       Balance Overall balance assessment: Needs assistance Sitting-balance support: No upper extremity supported;Feet supported Sitting balance-Leahy Scale: Good     Standing balance support: No upper extremity supported;During functional activity Standing balance-Leahy Scale: Fair                              Cognition Arousal/Alertness: Awake/alert Behavior During Therapy: Flat affect Overall Cognitive Status: No family/caregiver present to determine baseline cognitive functioning                                        Exercises General Exercises - Lower Extremity Long Arc Quad: AROM;Strengthening;Both;10 reps;Seated Hip ABduction/ADduction: AROM;Strengthening;Both;10 reps;Seated (hip adduction squeezes) Hip Flexion/Marching: AROM;Strengthening;Both;10 reps;Seated    General Comments General comments (skin integrity, edema, etc.): Pt received on room air & SpO2 88%, only able to  increase to 89% with pursed lip breathing (pt still requires ongoing cuing for proper technique). Pt placed on 2L/min & left on 2L throughout & at end of session with SpO2 >90% & nurse made aware. Max HR during session 114 bpm      Pertinent Vitals/Pain Pain Assessment: No/denies pain    Home Living                      Prior Function            PT Goals (current goals can now be found in the care plan section)  Acute Rehab PT Goals Patient Stated Goal: to feel better PT Goal Formulation: With patient Time For Goal Achievement: 08/13/20 Potential to Achieve Goals: Good Progress towards PT goals: Progressing toward goals    Frequency    Min 2X/week      PT Plan Discharge plan needs to be updated    Co-evaluation              AM-PAC PT "6 Clicks" Mobility   Outcome Measure  Help needed turning from your back to your side while in a flat bed without using bedrails?: None Help needed moving from lying on your back to sitting on the side of a flat bed without using bedrails?: None Help needed moving to and from a bed to a chair (including a wheelchair)?: None Help needed standing up from a chair using your arms (e.g., wheelchair or bedside chair)?: None Help needed to walk in hospital room?: A Little Help needed climbing 3-5 steps with a railing? : A Little 6 Click Score: 22    End of Session Equipment Utilized During Treatment: Gait belt;Oxygen Activity Tolerance: Patient tolerated treatment well Patient left: in chair;with chair alarm set;with call bell/phone within reach Nurse Communication:  (O2) PT Visit Diagnosis: Unsteadiness on feet (R26.81);Difficulty in walking, not elsewhere classified (R26.2);Muscle weakness (generalized) (M62.81)     Time: 3536-1443 PT Time Calculation (min) (ACUTE ONLY): 23 min  Charges:  $Therapeutic Activity: 23-37 mins                     Roger Gutierrez, PT, DPT 07/31/20, 12:10 PM    Roger Gutierrez 07/31/2020, 12:08 PM

## 2020-08-01 DIAGNOSIS — I5023 Acute on chronic systolic (congestive) heart failure: Secondary | ICD-10-CM

## 2020-08-01 DIAGNOSIS — J9601 Acute respiratory failure with hypoxia: Secondary | ICD-10-CM | POA: Diagnosis not present

## 2020-08-01 DIAGNOSIS — I509 Heart failure, unspecified: Secondary | ICD-10-CM | POA: Diagnosis not present

## 2020-08-01 LAB — BASIC METABOLIC PANEL
Anion gap: 13 (ref 5–15)
BUN: 34 mg/dL — ABNORMAL HIGH (ref 8–23)
CO2: 35 mmol/L — ABNORMAL HIGH (ref 22–32)
Calcium: 8.7 mg/dL — ABNORMAL LOW (ref 8.9–10.3)
Chloride: 87 mmol/L — ABNORMAL LOW (ref 98–111)
Creatinine, Ser: 0.62 mg/dL (ref 0.61–1.24)
GFR, Estimated: >60 mL/min (ref >60)
Glucose, Bld: 133 mg/dL — ABNORMAL HIGH (ref 70–99)
Potassium: 3.1 mmol/L — ABNORMAL LOW (ref 3.5–5.1)
Sodium: 135 mmol/L (ref 135–145)

## 2020-08-01 MED ORDER — FUROSEMIDE 40 MG PO TABS: 40.0000 mg | ORAL_TABLET | Freq: Every day | ORAL | Status: DC

## 2020-08-01 MED ORDER — POTASSIUM CHLORIDE CRYS ER 20 MEQ PO TBCR
40.0000 meq | EXTENDED_RELEASE_TABLET | Freq: Once | ORAL | Status: AC
  Administered 2020-08-01: 40 meq via ORAL
  Filled 2020-08-01: qty 2

## 2020-08-01 NOTE — NC FL2 (Signed)
[place] MEDICAID FL2 LEVEL OF CARE SCREENING TOOL     IDENTIFICATION  Patient Name: Roger Gutierrez Birthdate: May 03, 1940 Sex: male Admission Date (Current Location): 08/11/20  [place] and [place] Number:  Chiropodist and Address:  [facility], [address] [place], [place] [address]      Provider Number: [id number]  Attending Physician Name and Address:  Cathren Harsh, MD  Relative Name and Phone Number:  Tavonte Seybold [phone] Daughter    Current Level of Care: Hospital Recommended Level of Care: Skilled Nursing Facility Prior Approval Number:    Date Approved/Denied:   PASRR Number: [id number]  Discharge Plan: SNF    Current Diagnoses: Patient Active Problem List   Diagnosis Date Noted  . COPD (chronic obstructive pulmonary disease) (HCC) 08/11/20  . Acute exacerbation of CHF (congestive heart failure) (HCC) 08-11-20  . Lytic lesion of bone 11/01/2019  . Frequent headaches 11/01/2019  . Bilateral carotid artery stenosis 12/16/2016  . Inferior myocardial infarction (HCC) 12/16/2016  . Depression 10/29/2016  . Insomnia 10/29/2016  . Vitamin D deficiency 03/29/2016  . Cigar smoker 01/13/2015  . CAD (coronary artery disease) 12/16/2014  . Anorexia 12/15/2014  . Long term current use of systemic steroids 12/15/2014  . CAFL (chronic airflow limitation) (HCC) 12/15/2014  . Failure of erection 12/15/2014  . Hypercholesteremia 12/15/2014  . OP (osteoporosis) 12/15/2014  . Ischemic cardiomyopathy 06/03/2014  . Abnormal loss of weight 04/03/2009  . H/O acute poliomyelitis 12/20/2007  . Scoliosis 12/20/2007  . Benign hypertension 07/09/2007  . Tobacco abuse 07/09/2007    Orientation RESPIRATION BLADDER Height & Weight     Self,Place  (S) O2 (2L [place]) Continent Weight: 39.8 kg Height:  5\' 5"  (165.1 cm)  BEHAVIORAL SYMPTOMS/MOOD NEUROLOGICAL BOWEL NUTRITION STATUS      Continent Diet  AMBULATORY STATUS  COMMUNICATION OF NEEDS Skin   Limited Assist Verbally Other (Comment) (Ecchymosis arm)                       Personal Care Assistance Level of Assistance  Bathing,Feeding,Dressing Bathing Assistance: Limited assistance Feeding assistance: Independent Dressing Assistance: Limited assistance     Functional Limitations Info  Sight,Hearing,Speech Sight Info: Adequate Hearing Info: Adequate Speech Info: Adequate    SPECIAL CARE FACTORS FREQUENCY  PT (By licensed PT),OT (By licensed OT)     PT Frequency: 5E WEEK OT Frequency: 5X WEEK            Contractures Contractures Info: Not present    Additional Factors Info  Code Status,Allergies Code Status Info: DNR Allergies Info: Megace           Current Medications (08/01/2020):  This is the current hospital active medication list Current Facility-Administered Medications  Medication Dose Route Frequency Provider Last Rate Last Admin  . 0.9 %  sodium chloride infusion  250 mL Intravenous PRN 10/01/2020, MD      . acetaminophen (TYLENOL) tablet 650 mg  650 mg Oral Q4H PRN Garba, Mohammad L, MD      . albuterol (VENTOLIN HFA) 108 (90 Base) MCG/ACT inhaler 2 puff  2 puff Inhalation Q6H PRN Rometta Emery, MD      . aspirin EC tablet 81 mg  81 mg Oral Daily Rometta Emery L, MD   81 mg at 08/01/20 0819  . cyclobenzaprine (FLEXERIL) tablet 5 mg  5 mg Oral TID PRN 10/01/20, MD      . DULoxetine (CYMBALTA) DR  capsule 30 mg  30 mg Oral Daily Rometta Emery, MD   30 mg at 08/01/20 6384  . feeding supplement (ENSURE ENLIVE / ENSURE PLUS) liquid 237 mL  237 mL Oral TID BM Rai, Ripudeep K, MD   237 mL at 07/31/20 2118  . furosemide (LASIX) injection 40 mg  40 mg Intravenous BID Rometta Emery, MD   40 mg at 08/01/20 0819  . ibuprofen (ADVIL) tablet 400 mg  400 mg Oral PRN Earlie Lou L, MD      . ondansetron (ZOFRAN) injection 4 mg  4 mg Intravenous Q6H PRN Rometta Emery, MD      .  oxyCODONE-acetaminophen (PERCOCET) 7.5-325 MG per tablet 1 tablet  1 tablet Oral Q4H PRN Rometta Emery, MD   1 tablet at 08/01/20 0820  . potassium chloride SA (KLOR-CON) CR tablet 40 mEq  40 mEq Oral Once Rai, Ripudeep K, MD      . pravastatin (PRAVACHOL) tablet 80 mg  80 mg Oral Daily Earlie Lou L, MD   80 mg at 08/01/20 0820  . ramipril (ALTACE) capsule 5 mg  5 mg Oral Daily Rometta Emery, MD   5 mg at 08/01/20 0820  . sodium chloride flush (NS) 0.9 % injection 3 mL  3 mL Intravenous Q12H Rometta Emery, MD   3 mL at 08/01/20 6659  . sodium chloride flush (NS) 0.9 % injection 3 mL  3 mL Intravenous PRN Rometta Emery, MD         Discharge Medications: Please see discharge summary for a list of discharge medications.  Relevant Imaging Results:  Relevant Lab Results:   Additional Information SS# [id number]  Hetty Ely, RN

## 2020-08-01 NOTE — Progress Notes (Signed)
Occupational Therapy Treatment Patient Details Name: Roger Gutierrez MRN: [id number] DOB: [birth year] Today's Date: 08/01/2020    History of present illness Pt is a 80 y/o M who presented to the ED with progressive SOB, cough & wheezing for over a week. Chest x-ray showed cardiomegaly with bibasilar atelectasis left greater than right, small bilateral pleural effusions.  Patient was admitted for acute CHF exacerbation. PMH: COPD, ongoing tobacco abuse, HTN, CAD, osteoporosis, cardiac stents about 10 years ago, scoliosis with chronic back pain, MI   OT comments  Pt seen for OT treatment on this date. Upon arrival to room, pt awake seated upright in bed. Pt initially hesitant to work with OT this AM, however agreeable following MIN encouragement. Pt received on 2L/min. While sitting EOB, SpO2 88% and unable to increase to >90% following pursed lip breathing (of note, pt still requires ongoing cuing for proper technique). D/t R thoracic concavity, pt encouraged to reposition torso by standing however SpO2 continued to decrease to 86%. Pt returned to sitting EOB and placed on 3L/min & left on 3L throughout session with SpO2 88-91%.   While sitting EOB, pt required SUPERVISION/SET-UP to perform UB dressing and MIN A to perform LB dressing. While sitting EOB to obtain SpO2 reading, pt with R lateral lean and reporting feeling fatigued. Pt returned to supine and O2 titrated to 2L. Following 2 mins of activity cessation, SpO2 89% and pt left in no acute distress. RN made aware. Of note, max HR during session 127 bpm while standing. Pt continues to present with decreased activity tolerance, strength, and balance, and would continue to benefit from skilled OT services to maximize return to PLOF and minimize risk of future falls, injury, caregiver burden, and readmission. Will continue to follow POC. Discharge recommendation remains appropriate.    Follow Up Recommendations  SNF    Equipment Recommendations   Other (comment) (defer to next venue of care)       Precautions / Restrictions Precautions Precautions: Fall Restrictions Weight Bearing Restrictions: No       Mobility Bed Mobility Overal bed mobility: Modified Independent Bed Mobility: Supine to Sit;Sit to Supine     Supine to sit: Modified independent (Device/Increase time);HOB elevated Sit to supine: Supervision;HOB elevated        Transfers Overall transfer level: Needs assistance Equipment used: Rolling walker (2 wheeled) Transfers: Sit to/from Stand Sit to Stand: Min guard         General transfer comment: Required verbal cues for safe hand placement with RW use    Balance Overall balance assessment: Needs assistance Sitting-balance support: No upper extremity supported;Feet supported Sitting balance-Leahy Scale: Fair Sitting balance - Comments: After sitting EOB for 3 mins, pt with L lateral lean d/t fatigue. Able to self-correct following verbal cues from this author   Standing balance support: No upper extremity supported;During functional activity Standing balance-Leahy Scale: Fair Standing balance comment: fair standing balance while standing with BUE on RW                           ADL either performed or assessed with clinical judgement   ADL Overall ADL's : Needs assistance/impaired     Grooming: Wash/dry face;Supervision/safety;Set up;Bed level           Upper Body Dressing : Supervision/safety;Set up;Sitting Upper Body Dressing Details (indicate cue type and reason): supervision while sitting EOB to doff overhead shirt and don hospital gown Lower Body Dressing: Minimal assistance;Sitting/lateral leans  Lower Body Dressing Details (indicate cue type and reason): to don socks while sitting EOB                               Cognition Arousal/Alertness: Awake/alert Behavior During Therapy: Flat affect Overall Cognitive Status: No family/caregiver present to determine  baseline cognitive functioning                                                General Comments Pt received on 2L/min. While sitting EOB, SpO2 88% and unable to increase to >90% following pursed lip breathing (of note, pt still requires ongoing cuing for proper technique). D/t R thoracic concavity, pt encouraged to reposition torso by standing however SpO2 continued to decrease to 86%. Pt returned to sitting EOB and placed on 3L/min & left on 3L throughout session with SpO2 88-91%. SpO2 unable to increase to >92%. At end of session, pt returned to bed on 2L of O2 with SpO2 89% and RN made aware. Max HR during session 127 bpm    Pertinent Vitals/ Pain       Pain Assessment: Faces Faces Pain Scale: Hurts little more Pain Location: low back Pain Descriptors / Indicators: Aching Pain Intervention(s): Premedicated before session;Monitored during session;Limited activity within patient's tolerance         Frequency  Min 1X/week        Progress Toward Goals  OT Goals(current goals can now be found in the care plan section)  Progress towards OT goals: Progressing toward goals  Acute Rehab OT Goals Patient Stated Goal: to feel better OT Goal Formulation: With patient Time For Goal Achievement: 08/13/20 Potential to Achieve Goals: Good  Plan Discharge plan remains appropriate;Frequency remains appropriate       AM-PAC OT "6 Clicks" Daily Activity     Outcome Measure   Help from another person eating meals?: None Help from another person taking care of personal grooming?: A Little Help from another person toileting, which includes using toliet, bedpan, or urinal?: A Little Help from another person bathing (including washing, rinsing, drying)?: A Lot Help from another person to put on and taking off regular upper body clothing?: A Little Help from another person to put on and taking off regular lower body clothing?: A Lot 6 Click Score: 17    End of Session  Equipment Utilized During Treatment: Gait belt;Rolling walker;Oxygen  OT Visit Diagnosis: Unsteadiness on feet (R26.81);Muscle weakness (generalized) (M62.81)   Activity Tolerance Patient tolerated treatment well   Patient Left in bed;with call bell/phone within reach;with bed alarm set   Nurse Communication Mobility status;Other (comment) (SpO2)        Time: 6606-3016 OT Time Calculation (min): 24 min  Charges: OT General Charges $OT Visit: 1 Visit OT Treatments $Self Care/Home Management : 23-37 mins   Matthew Folks, OTR/L [facility] [phone]

## 2020-08-01 NOTE — Progress Notes (Signed)
Physical Therapy Treatment Patient Details Name: Roger Gutierrez MRN: [id number] DOB: 1940-03-29 Today's Date: 08/01/2020    History of Present Illness Pt is a 80 y/o M who presented to the ED with progressive SOB, cough & wheezing for over a week. Chest x-ray showed cardiomegaly with bibasilar atelectasis left greater than right, small bilateral pleural effusions.  Patient was admitted for acute CHF exacerbation. PMH: COPD, ongoing tobacco abuse, HTN, CAD, osteoporosis, cardiac stents about 10 years ago, scoliosis with chronic back pain, MI    PT Comments    Pt was supine in bed with HOB elevated upon arriving. On 3L with sao2 90%. He agrees to PT session and is cooperative but has flat affect. Was able to exit bed, stand to RW, and ambulate however severely limited by fatigue. HR elevated to 130s with minimal distance. Pt does have unsteadiness during ambulation and is high fall risk. Highly recommend DC to SNF to assist pt to PLOF.   Follow Up Recommendations  SNF;Supervision/Assistance - 24 hour;Supervision for mobility/OOB     Equipment Recommendations  Other (comment) (defer to next level of care)       Precautions / Restrictions Precautions Precautions: Fall Restrictions Weight Bearing Restrictions: No    Mobility  Bed Mobility Overal bed mobility: Modified Independent Bed Mobility: Supine to Sit;Sit to Supine     Supine to sit: Modified independent (Device/Increase time);HOB elevated Sit to supine: Supervision;HOB elevated   General bed mobility comments: Increased time to perform with [facility] elevated    Transfers Overall transfer level: Needs assistance Equipment used: Rolling walker (2 wheeled) Transfers: Sit to/from Stand Sit to Stand: Min guard         General transfer comment: CGA for safety  Ambulation/Gait Ambulation/Gait assistance: Min guard;Min assist Gait Distance (Feet): 60 Feet Assistive device: Rolling walker (2 wheeled) Gait Pattern/deviations:  Decreased step length - left;Decreased step length - right;Decreased stride length;Trunk flexed Gait velocity: decreased   General Gait Details: Pt is very deconditioned. He c/o fatigue after ambulation ~ 30 ft while ambulating on 2 L o2 [place]. sao2 90% with HR elevation to 133bpm. quickly recovers once returned to bed to 105bpm.       Balance Overall balance assessment: Needs assistance Sitting-balance support: No upper extremity supported;Feet supported Sitting balance-Leahy Scale: Fair Sitting balance - Comments: After sitting EOB for 3 mins, pt with L lateral lean d/t fatigue. Able to self-correct following verbal cues from this author   Standing balance support: During functional activity;Bilateral upper extremity supported Standing balance-Leahy Scale: Fair Standing balance comment: pt does have several occasions of unsteadiness even with BUE support on RW       Cognition Arousal/Alertness: Awake/alert Behavior During Therapy: Flat affect Overall Cognitive Status: No family/caregiver present to determine baseline cognitive functioning      General Comments: Pt is A but oriented to self only. Was able to perform all desired task with increased time to process.         General Comments General comments (skin integrity, edema, etc.): Pt received on 2L/min. While sitting EOB, SpO2 88% and unable to increase to >90% following pursed lip breathing (of note, pt still requires ongoing cuing for proper technique). D/t R thoracic concavity, pt encouraged to reposition torso by standing however SpO2 continued to decrease to 86%. Pt returned to sitting EOB and placed on 3L/min & left on 3L throughout session with SpO2 88-91%. SpO2 unable to increase to >92%. At end of session, pt returned to bed on 2L  of O2 with SpO2 89% and RN made aware. Max HR during session 127 bpm      Pertinent Vitals/Pain Pain Assessment: No/denies pain Pain Score: 0-No pain Faces Pain Scale: Hurts little more Pain  Location: low back Pain Descriptors / Indicators: Aching Pain Intervention(s): Premedicated before session;Monitored during session;Limited activity within patient's tolerance           PT Goals (current goals can now be found in the care plan section) Acute Rehab PT Goals Patient Stated Goal: to feel better Progress towards PT goals: Progressing toward goals    Frequency    Min 2X/week      PT Plan Discharge plan needs to be updated       AM-PAC PT "6 Clicks" Mobility   Outcome Measure  Help needed turning from your back to your side while in a flat bed without using bedrails?: None Help needed moving from lying on your back to sitting on the side of a flat bed without using bedrails?: None Help needed moving to and from a bed to a chair (including a wheelchair)?: None Help needed standing up from a chair using your arms (e.g., wheelchair or bedside chair)?: None Help needed to walk in hospital room?: A Little Help needed climbing 3-5 steps with a railing? : A Little 6 Click Score: 22    End of Session Equipment Utilized During Treatment: Gait belt;Oxygen Activity Tolerance: Patient limited by fatigue Patient left: in bed;with call bell/phone within reach;with bed alarm set Nurse Communication: Mobility status PT Visit Diagnosis: Unsteadiness on feet (R26.81);Difficulty in walking, not elsewhere classified (R26.2);Muscle weakness (generalized) (M62.81)     Time: 6948-5462 PT Time Calculation (min) (ACUTE ONLY): 12 min  Charges:  $Gait Training: 8-22 mins                     Jetta Lout PTA 08/01/20, 3:58 PM

## 2020-08-01 NOTE — Progress Notes (Signed)
Mobility Specialist - Progress Note   08/01/20 1500  Mobility  Activity Refused mobility  Mobility performed by Mobility specialist    Pt politely declined mobility this date, states he just finished ambulating and would like to rest at this time. Will attempt session at another date/time.    Filiberto Pinks Mobility Specialist 08/01/20, 3:28 PM

## 2020-08-01 NOTE — ACP (Advance Care Planning) (Signed)
Chaplain Kaylee Trivett completed and AD for [name]. Healthcare Agent is Fleming Prill ph:[phone]. Jai Steil ph:[phone].

## 2020-08-01 NOTE — Progress Notes (Signed)
   08/01/20 1451  Advance Directives (For Healthcare)  Does Patient Have a Medical Advance Directive? Yes  Does patient want to make changes to medical advance directive? No - Patient declined  Type of Advance Directive Healthcare Power of Attorney  Copy of Healthcare Power of Attorney in Chart? Yes - validated most recent copy scanned in chart (See row information)  Mental Health Advance Directives  Does Patient Have a Mental Health Advance Directive? No  Chaplain Daylani Deblois completed an AD for [name], room 2A-254. Healthcare agent is, Sanjiv Castorena cell# [phone], and Irby Fails cell #-[phone].

## 2020-08-01 NOTE — TOC Progression Note (Signed)
Transition of Care [facility]) - Progression Note    Patient Details  Name: Roger Gutierrez MRN: [id number] Date of Birth: 1941-02-20  [facility] CM/SW Contact  Hetty Ely, RN Phone Number: 08/01/2020, 10:28 AM  Clinical Narrative:  Called and spoke with daughter, Najir Roop about SNF recommendation. Daughter consents with proceeding with SNF placement, no preference, to do search and then discuss acceptance.          Expected Discharge Plan and Services                                                 Social Determinants of Health (SDOH) Interventions    Readmission Risk Interventions No flowsheet data found.

## 2020-08-01 NOTE — Progress Notes (Signed)
[facility]                                                                              Patient Demographics  Krikor Willet, is a 80 y.o. male, DOB - [birth year], ZOX:[id number]  Admit date - 07/31/2020   Admitting Physician Rometta Emery, MD  Outpatient Primary MD for the patient is Fisher, Demetrios Isaacs, MD  Outpatient specialists:   LOS - 4  days   Medical records reviewed and are as summarized below:    Chief Complaint  Patient presents with  . Failure To Thrive  . Weakness       Brief summary   Patient is a 80 year old male with history of COPD, ongoing tobacco use, hypertension, CAD status post prior stents 10 years ago, osteoporosis, scoliosis, chronic back pain presented to ED with progressive shortness of breath, coughing and wheezing for a week.  Patient also had generalized weakness, leg swelling.   Chest x-ray showed cardiomegaly with bibasilar atelectasis left greater than right, small bilateral pleural effusions.  Patient was admitted for acute CHF exacerbation.  BNP 185.  O2 sats 92% on 2 L  08/01/2020-08/01/2020 Patient was admitted with acute CHF. -Placed on IV Lasix for diuresis, negative balance of 5.8 L, transitioned to oral Lasix -Patient has L5 vertebral compression fracture, wants to do kyphoplasty outpatient and will follow up with Dr. Rosita Kea -Severe protein calorie malnutrition with failure to thrive with poor oral intake.  Placed on nutritional supplements  Pending skilled nursing facility once bed available  Assessment & Plan    Principal Problem:   Acute exacerbation of diastolic CHF (congestive heart failure) (HCC) -Underlying history of COPD, smoking, hypertension, CAD -2D echo showed EF of 55 to 60% with grade 1 DD, right ventricular function normal, moderate to marked aortic dilation.  CTA chest abdomen pelvis showed no aortic dilatation. -Cardiology following, continue ramipril 5 mg daily -Currently on IV Lasix 40 mg every 12  hours, negative balance of 5.8L will transition to oral Lasix  daily   Active Problems: L5 vertebral body compression fracture -Incidentally seen on CT chest abdomen pelvis, acute to subacute fracture of L5 vertebral body with approximately 60% height loss -Patient reports pain in the lower lumbar region, PT recommended SNF -Dr Rosita Kea was consulted, patient requested outpatient kyphoplasty, will follow in 1 to 2 weeks after discharge  History of coronary artery disease status post PCI -No chest pain, continue aspirin, Lasix, statin, ramipril     Hypercholesteremia -Continue statin    Benign hypertension -Stable, continue Lasix, ramipril    Tobacco abuse -Counseled on nicotine cessation    COPD (chronic obstructive pulmonary disease) (HCC) with mild exacerbation -Continue nebs  Scoliosis with chronic back pain Continue home pain regimen  Anorexia with hypoalbuminemia, cachectic appearance.  severe Protein calorie malnutrition Estimated body mass index is 14.6 kg/m as calculated from the following:   Height as of this encounter:  (1.651 m).   Weight as of this encounter: 39.8 kg.  -Registered dietitian consulted, placed on nutritional supplements -sedentary life style per daughter, doesn't eat much    Goals of care  - discussed with  daughter, she is okay with outpatient kyphoplasty and insisted on SNF as patient is not able to care for himself. Lives with wife who has dementia and has full time care giver.     Code Status full code DVT Prophylaxis:     Level of Care: Level of care: Progressive Cardiac Family Communication: Discussed all imaging results, lab results, explained to the patient and patient's daughter, Freedom Peddy on phone [phone] on 5/9   Disposition Plan:     Status is: Inpatient  Remains inpatient appropriate because:Inpatient level of care appropriate due to severity of illness   Dispo: The patient is from: Home              Anticipated  d/c is to: SNF              Patient currently is not medically stable to d/c.  Needs skilled nursing facility   Difficult to place patient No   Time Spent in minutes 25 mins   Procedures:  2D echo   Consultants:   cardiology  Antimicrobials:   Anti-infectives (From admission, onward)   None         Medications  Scheduled Meds: . aspirin EC  81 mg Oral Daily  . DULoxetine  30 mg Oral Daily  . feeding supplement  237 mL Oral TID BM  . furosemide  40 mg Intravenous BID  . potassium chloride  40 mEq Oral Once  . pravastatin  80 mg Oral Daily  . ramipril  5 mg Oral Daily  . sodium chloride flush  3 mL Intravenous Q12H   Continuous Infusions: . sodium chloride     PRN Meds:.sodium chloride, acetaminophen, albuterol, cyclobenzaprine, ibuprofen, ondansetron (ZOFRAN) IV, oxyCODONE-acetaminophen, sodium chloride flush      Subjective:   Magic Mohler was seen and examined today.  Shortness of breath is improving, no acute issues, still has back pain, currently controlled.  No chest pain, nausea vomiting or abdominal pain  Objective:   Vitals:   07/31/20 2227 08/01/20 0405 08/01/20 0746 08/01/20 1221  BP: 121/87 120/85 123/86 105/77  Pulse: 100 100 100 (!) 109  Resp: 19 19 16 16   Temp: 98.4 F (36.9 C) 98.2 F (36.8 C) 98.1 F (36.7 C) 97.8 F (36.6 C)  TempSrc: Oral Oral Oral   SpO2: 91% 92% 92% 90%  Weight:  39.8 kg    Height:        Intake/Output Summary (Last 24 hours) at 08/01/2020 1423 Last data filed at 08/01/2020 1330 Gross per 24 hour  Intake 0 ml  Output 1125 ml  Net -1125 ml     Wt Readings from Last 3 Encounters:  08/01/20 39.8 kg  07/15/20 47.6 kg  03/03/20 46.7 kg    Physical Exam  General: Alert and oriented x 3, NAD, frail and cachectic appearing  Cardiovascular: S1 S2 clear, RRR.  No pedal edema b/l  Respiratory: Diminished breath sound at the bases  Gastrointestinal: Soft, nontender, nondistended, NBS  Ext: no pedal edema  bilaterally  Neuro: no new deficits  Musculoskeletal: No cyanosis, clubbing  Skin: No rashes  Psych: flat affect   Data Reviewed:  I have personally reviewed following labs and imaging studies  Micro Results Recent Results (from the past 240 hour(s))  Resp Panel by RT-PCR (Flu A&B, Covid) Nasopharyngeal Swab     Status: None   Collection Time: Aug 26, 2020  8:34 PM   Specimen: Nasopharyngeal Swab; Nasopharyngeal(NP) swabs in vial transport medium  Result Value Ref Range  Status   SARS Coronavirus 2 by RT PCR NEGATIVE NEGATIVE Final    Comment: (NOTE) SARS-CoV-2 target nucleic acids are NOT DETECTED.  The SARS-CoV-2 RNA is generally detectable in upper respiratory specimens during the acute phase of infection. The lowest concentration of SARS-CoV-2 viral copies this assay can detect is 138 copies/mL. A negative result does not preclude SARS-Cov-2 infection and should not be used as the sole basis for treatment or other patient management decisions. A negative result may occur with  improper specimen collection/handling, submission of specimen other than nasopharyngeal swab, presence of viral mutation(s) within the areas targeted by this assay, and inadequate number of viral copies(<138 copies/mL). A negative result must be combined with clinical observations, patient history, and epidemiological information. The expected result is Negative.  Fact Sheet for Patients:  BloggerCourse.comhttps://www.fda.gov/media/152166/download  Fact Sheet for Healthcare Providers:  SeriousBroker.ithttps://www.fda.gov/media/152162/download  This test is no t yet approved or cleared by the [place] FDA and  has been authorized for detection and/or diagnosis of SARS-CoV-2 by FDA under an Emergency Use Authorization (EUA). This EUA will remain  in effect (meaning this test can be used) for the duration of the COVID-19 declaration under Section 564(b)(1) of the Act, 21 U.S.C.section 360bbb-3(b)(1), unless the authorization is  terminated  or revoked sooner.       Influenza A by PCR NEGATIVE NEGATIVE Final   Influenza B by PCR NEGATIVE NEGATIVE Final    Comment: (NOTE) The Xpert Xpress SARS-CoV-2/FLU/RSV plus assay is intended as an aid in the diagnosis of influenza from Nasopharyngeal swab specimens and should not be used as a sole basis for treatment. Nasal washings and aspirates are unacceptable for Xpert Xpress SARS-CoV-2/FLU/RSV testing.  Fact Sheet for Patients: BloggerCourse.comhttps://www.fda.gov/media/152166/download  Fact Sheet for Healthcare Providers: SeriousBroker.ithttps://www.fda.gov/media/152162/download  This test is not yet approved or cleared by the [place] FDA and has been authorized for detection and/or diagnosis of SARS-CoV-2 by FDA under an Emergency Use Authorization (EUA). This EUA will remain in effect (meaning this test can be used) for the duration of the COVID-19 declaration under Section 564(b)(1) of the Act, 21 U.S.C. section 360bbb-3(b)(1), unless the authorization is terminated or revoked.  Performed at [facility], [address] [place], [place] [address]     Radiology Reports DG Chest 2 View  Result Date: 08/16/2020 CLINICAL DATA:  Generalized weakness and lethargy. EXAM: CHEST - 2 VIEW COMPARISON:  November 10, 2012 FINDINGS: Chronic appearing increased lung markings are seen with mild to moderate severity atelectasis and/or infiltrate noted within the left lung base. Mild right basilar atelectasis and/or early infiltrate is also seen. Very small bilateral pleural effusions are noted. No pneumothorax is identified. The cardiac silhouette is moderately enlarged. Marked severity S-shaped scoliosis of the thoracolumbar spine is seen. IMPRESSION: 1. Cardiomegaly with bibasilar atelectasis and/or infiltrate, left greater than right. 2. Very small bilateral pleural effusions. Electronically Signed   By: Aram Candelahaddeus  Houston M.D.   On: 08/19/2020 18:00   DG Lumbar Spine 2-3 Views  Result  Date: 07/15/2020 CLINICAL DATA:  Pain after lifting injury 2 days ago. Pain radiates to the left side. EXAM: LUMBAR SPINE - 2-3 VIEW COMPARISON:  None. FINDINGS: No visible fracture, erosion, or bone lesion. Marked lumbar levoscoliosis. Generalized osteopenia. Prominent arterial calcification of the aorta. IMPRESSION: No acute finding, although sensitivity is degraded by osteopenia and marked levoscoliosis Electronically Signed   By: Marnee SpringJonathon  Watts M.D.   On: 07/15/2020 11:05   CT Lumbar Spine Wo Contrast  Result Date: 07/15/2020  CLINICAL DATA:  Low back pain.  Increased fracture risk. EXAM: CT LUMBAR SPINE WITHOUT CONTRAST TECHNIQUE: Multidetector CT imaging of the lumbar spine was performed without intravenous contrast administration. Multiplanar CT image reconstructions were also generated. COMPARISON:  Lumbar spine radiographs 07/15/2020. CT abdomen and pelvis 04/12/2009. FINDINGS: Segmentation: 5 lumbar type vertebrae. Alignment: Severe levoscoliosis with apex at L3. No significant listhesis in the sagittal plane. Vertebrae: Diffuse osteopenia. No fracture or destructive osseous process. Subcentimeter sclerotic focus in the L3 vertebral body, also present in 2011 and likely reflecting a bone island. Paraspinal and other soft tissues: Extensive abdominal aortic atherosclerosis without aneurysm. Asymmetric right-sided paraspinal muscle atrophy. Disc levels: Advanced asymmetric right-sided disc space narrowing at L3-4 with endplate and facet spurring resulting in moderate right neural foraminal stenosis. Moderate right neural foraminal stenosis at L4-5 and mild-to-moderate left neural foraminal stenosis at L5-S1 due to disc bulging and facet spurring. No evidence of significant lumbar spinal canal stenosis. IMPRESSION: 1. No evidence of acute osseous abnormality. 2. Severe lumbar levoscoliosis. 3. Moderate right neural foraminal stenosis at L3-4 and L4-5. No evidence of significant spinal stenosis. 4. Aortic  Atherosclerosis (ICD10-I70.0). Electronically Signed   By: Sebastian Ache M.D.   On: 07/15/2020 12:36   US Venous Img Lower Bilateral (DVT)  Result Date: 07/29/2020 CLINICAL DATA:  80 year old male with bilateral lower extremity edema for the past week EXAM: BILATERAL LOWER EXTREMITY VENOUS DOPPLER ULTRASOUND TECHNIQUE: Gray-scale sonography with graded compression, as well as color Doppler and duplex ultrasound were performed to evaluate the lower extremity deep venous systems from the level of the common femoral vein and including the common femoral, femoral, profunda femoral, popliteal and calf veins including the posterior tibial, peroneal and gastrocnemius veins when visible. The superficial great saphenous vein was also interrogated. Spectral Doppler was utilized to evaluate flow at rest and with distal augmentation maneuvers in the common femoral, femoral and popliteal veins. COMPARISON:  None. FINDINGS: RIGHT LOWER EXTREMITY Common Femoral Vein: No evidence of thrombus. Normal compressibility, respiratory phasicity and response to augmentation. Saphenofemoral Junction: No evidence of thrombus. Normal compressibility and flow on color Doppler imaging. Profunda Femoral Vein: No evidence of thrombus. Normal compressibility and flow on color Doppler imaging. Femoral Vein: No evidence of thrombus. Normal compressibility, respiratory phasicity and response to augmentation. Popliteal Vein: No evidence of thrombus. Normal compressibility, respiratory phasicity and response to augmentation. Calf Veins: No evidence of thrombus. Normal compressibility and flow on color Doppler imaging. Superficial Great Saphenous Vein: No evidence of thrombus. Normal compressibility. Venous Reflux:  None. Other Findings:  None. LEFT LOWER EXTREMITY Common Femoral Vein: No evidence of thrombus. Normal compressibility, respiratory phasicity and response to augmentation. Saphenofemoral Junction: No evidence of thrombus. Normal  compressibility and flow on color Doppler imaging. Profunda Femoral Vein: No evidence of thrombus. Normal compressibility and flow on color Doppler imaging. Femoral Vein: No evidence of thrombus. Normal compressibility, respiratory phasicity and response to augmentation. Popliteal Vein: No evidence of thrombus. Normal compressibility, respiratory phasicity and response to augmentation. Calf Veins: No evidence of thrombus. Normal compressibility and flow on color Doppler imaging. Superficial Great Saphenous Vein: No evidence of thrombus. Normal compressibility. Venous Reflux:  None. Other Findings:  None. IMPRESSION: No evidence of deep venous thrombosis in either lower extremity. Electronically Signed   By: Malachy Moan M.D.   On: 07/29/2020 13:34   ECHOCARDIOGRAM COMPLETE  Result Date: 07/29/2020    ECHOCARDIOGRAM REPORT   Patient Name:   BLANCHE GALLIEN Date of Exam: 07/29/2020 Medical Rec #:  [id number]      Height:       65.0 in Accession #:    [id number]     Weight:       100.3 lb Date of Birth:  03-Apr-1940      BSA:          1.475 m Patient Age:    61 years       BP:           133/93 mmHg Patient Gender: M              HR:           94 bpm. Exam Location:  [facility] Procedure: 2D Echo Indications:     Diastolic CHF 150.31  History:         Patient has no prior history of Echocardiogram examinations.                  CAD, COPD; Risk Factors:Hypertension.  Sonographer:     L Thornton-Maynard Referring Phys:  [id number] Rometta Emery Diagnosing Phys: Kristeen Miss MD  Sonographer Comments: Image acquisition challenging due to patient body habitus. IMPRESSIONS  1. Left ventricular ejection fraction, by estimation, is 55 to 60%. The left ventricle has normal function. The left ventricle has no regional wall motion abnormalities. Left ventricular diastolic parameters are consistent with Grade I diastolic dysfunction (impaired relaxation).  2. Right ventricular systolic function is normal. The right ventricular size is  normal.  3. The mitral valve was not well visualized. No evidence of mitral valve regurgitation.  4. The aortic valve is grossly normal. Aortic valve regurgitation is not visualized. No aortic stenosis is present.  5. The echo is technically difficult and the aorta is not seen very well but it appears to be moderately - markedly dilated. Suggest CTA for better imaging of the aorta . Marland Kitchen Aortic dilatation noted. There is moderate dilatation of the aortic root, measuring 46 mm. FINDINGS  Left Ventricle: Left ventricular ejection fraction, by estimation, is 55 to 60%. The left ventricle has normal function. The left ventricle has no regional wall motion abnormalities. The left ventricular internal cavity size was normal in size. There is  no left ventricular hypertrophy. Left ventricular diastolic parameters are consistent with Grade I diastolic dysfunction (impaired relaxation). Right Ventricle: The right ventricular size is normal. No increase in right ventricular wall thickness. Right ventricular systolic function is normal. Left Atrium: Left atrial size was normal in size. Right Atrium: Right atrial size was normal in size. Pericardium: There is no evidence of pericardial effusion. Mitral Valve: The mitral valve was not well visualized. No evidence of mitral valve regurgitation. MV peak gradient, 3.4 mmHg. The mean mitral valve gradient is 2.0 mmHg. Tricuspid Valve: The tricuspid valve is not well visualized. Tricuspid valve regurgitation is not demonstrated. Aortic Valve: The aortic valve is grossly normal. Aortic valve regurgitation is not visualized. No aortic stenosis is present. Aortic valve mean gradient measures 2.0 mmHg. Aortic valve peak gradient measures 4.0 mmHg. Aortic valve area, by VTI measures 5.61 cm. Pulmonic Valve: The pulmonic valve was not well visualized. Pulmonic valve regurgitation is not visualized. Aorta: The echo is technically difficult and the aorta is not seen very well but it appears to  be moderately - markedly dilated. Suggest CTA for better imaging of the aorta. Aortic dilatation noted. There is moderate dilatation of the aortic root, measuring 46 mm. IAS/Shunts: The atrial septum is grossly normal.  LEFT VENTRICLE PLAX 2D LVIDd:  3.94 cm  Diastology LVIDs:         2.74 cm  LV e' medial:    4.35 cm/s LV PW:         0.92 cm  LV E/e' medial:  10.0 LV IVS:        1.20 cm  LV e' lateral:   5.87 cm/s LVOT diam:     2.50 cm  LV E/e' lateral: 7.4 LV SV:         90 LV SV Index:   61 LVOT Area:     4.91 cm  RIGHT VENTRICLE RV S prime:     15.80 cm/s TAPSE (M-mode): 3.0 cm LEFT ATRIUM             Index LA diam:        2.90 cm 1.97 cm/m LA Vol (A2C):   30.6 ml 20.74 ml/m LA Vol (A4C):   48.8 ml 33.08 ml/m LA Biplane Vol: 38.8 ml 26.30 ml/m  AORTIC VALVE                   PULMONIC VALVE AV Area (Vmax):    5.01 cm    PV Vmax:       0.98 m/s AV Area (Vmean):   5.13 cm    PV Peak grad:  3.8 mmHg AV Area (VTI):     5.61 cm AV Vmax:           100.00 cm/s AV Vmean:          70.400 cm/s AV VTI:            0.160 m AV Peak Grad:      4.0 mmHg AV Mean Grad:      2.0 mmHg LVOT Vmax:         102.00 cm/s LVOT Vmean:        73.600 cm/s LVOT VTI:          0.183 m LVOT/AV VTI ratio: 1.14  AORTA Ao Root diam: 4.60 cm MITRAL VALVE MV Area VTI:  5.87 cm     SHUNTS MV Peak grad: 3.4 mmHg     Systemic VTI:  0.18 m MV Mean grad: 2.0 mmHg     Systemic Diam: 2.50 cm MV Vmax:      0.93 m/s MV Vmean:     60.4 cm/s MV E velocity: 43.30 cm/s MV A velocity: 78.00 cm/s MV E/A ratio:  0.56 Kristeen Miss MD Electronically signed by Kristeen Miss MD Signature Date/Time: 07/29/2020/9:06:01 AM    Final    CT Angio Chest/Abd/Pel for Dissection W and/or W/WO  Result Date: 07/29/2020 CLINICAL DATA:  80 year old male with suspected thoracic aortic aneurysm. EXAM: CT ANGIOGRAPHY CHEST, ABDOMEN AND PELVIS TECHNIQUE: A noncontrast chest CT was initially performed. Multidetector CT imaging through the chest, abdomen and pelvis was  performed using the standard protocol during bolus administration of intravenous contrast. Multiplanar reconstructed images and MIPs were obtained and reviewed to evaluate the vascular anatomy. CONTRAST:  58mL OMNIPAQUE IOHEXOL 350 MG/ML SOLN COMPARISON:  CT lumbar spine from 07/15/2020, CT abdomen pelvis from 04/12/2009 FINDINGS: CTA CHEST FINDINGS VASCULAR Preferential opacification of the thoracic aorta. No evidence of thoracic aortic aneurysm or dissection. Scattered atherosclerotic calcifications. Normal heart size. No pericardial effusion. Sinues of Valsalva: 35 mm 36 x 34 mm Sinotubular Junction: 34 mm Ascending Aorta: 38 mm Aortic Arch: 25 mm Descending aorta: 25 mm at the level of the carina Branch vessels: Conventional branching pattern. No significant atherosclerotic changes. Coronary  arteries: Normal origins and courses. Severe atherosclerotic calcifications with proximal LAD stent in place. Main pulmonary artery: 27 mm. No evidence of central pulmonary embolism. Pulmonary veins: No anomalous pulmonary venous return. No evidence of left atrial appendage thrombus. Review of the MIP images confirms the above findings. NON VASCULAR Mediastinum/Nodes: No enlarged mediastinal, hilar, or axillary lymph nodes. Thyroid gland, trachea, and esophagus demonstrate no significant findings. Lungs/Pleura: Secretions are noted within the carina extending into the left mainstem and lobar bronchi. Severe centrilobular and paraseptal emphysema with multifocal bullous changes in the lingula and right middle lobe. No focal consolidations or suspicious pulmonary nodules. 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 Diffuse osteopenia. No acute osseous abnormality. CTA ABDOMEN AND PELVIS FINDINGS VASCULAR Aorta: Caliber and patent throughout with coarse circumferential fibrofatty and calcific atherosclerotic changes in the infrarenal portion.  Celiac: Patent without evidence of aneurysm, dissection, vasculitis or significant stenosis. SMA: Patent without evidence of aneurysm, dissection, vasculitis or significant stenosis. Renals: Single bilateral renal arteries are patent without evidence of aneurysm, dissection, vasculitis, fibromuscular dysplasia or significant stenosis. IMA: Patent without evidence of aneurysm, dissection, vasculitis or significant stenosis. Inflow: Scattered, nearly circumferential calcific atherosclerotic calcifications. At least moderate focal stenosis about the proximal left external iliac artery secondary to atherosclerotic plaque. Veins: No obvious venous abnormality within the limitations of this arterial phase study. Review of the MIP images confirms the above findings. NON-VASCULAR Hepatobiliary: No focal liver abnormality is seen. No gallstones, gallbladder wall thickening, or biliary dilatation. Pancreas: Unremarkable. No pancreatic ductal dilatation or surrounding inflammatory changes. Spleen: Normal in size without focal abnormality. Adrenals/Urinary Tract: Adrenal glands are not definitively visualized. Focal cortical thinning about the anterior aspect of the left inferior pole renal cortex. Kidneys are otherwise normal, without renal calculi, focal lesion, or hydronephrosis. Bladder is unremarkable. Stomach/Bowel: Stomach is within normal limits. Appendix is not definitively visualized. Moderate colonic stool burden. No evidence of bowel wall thickening, distention, or inflammatory changes. Lymphatic: No abdominopelvic lymphadenopathy. Reproductive: Prostate is unremarkable. Other: No abdominal wall hernia or abnormality. No abdominopelvic ascites. Musculoskeletal: Interval development of compression deformity of the L5 vertebral body with approximately 60% vertebral body height loss. No evidence of significant retropulsion. Diffuse osteopenia. Similar appearing sigmoid curvature of the thoracolumbar spine. IMPRESSION:  VASCULAR 1. No evidence of thoracic or abdominal aortic aneurysm. 2.  Aortic Atherosclerosis (ICD10-I70.0). NON VASCULAR 1. Acute to subacute compression fracture of the L5 vertebral body with approximately 60% height loss. No evidence of significant retropulsion. Consider MRI lumbar spine for further characterization. 2. Moderate colonic stool burden. 3. Secretions noted in the distal trachea and left proximal bronchi. No definite evidence of pneumonia. 4. Severe emphysema (ICD10-J43.9). These results will be called to the ordering clinician or representative by the Radiologist Assistant, and communication documented in the PACS or [employer]. Marliss Coots, MD [facility] Radiology Electronically Signed   By: Marliss Coots MD   On: 07/29/2020 14:59    Lab Data:  CBC: Recent Labs  Lab 07/29/2020 1724  WBC 11.5*  NEUTROABS 9.3*  HGB 14.8  HCT 43.0  MCV 92.9  PLT 444*   Basic Metabolic Panel: Recent Labs  Lab 08/04/2020 1724 07/29/20 0436 07/30/20 0428 07/31/20 0556 08/01/20 0517  NA 132* 132* 137 131* 135  K 4.1 3.3* 3.7 3.1* 3.1*  CL 95* 92* 94* 88* 87*  CO2 26 29 31  32 35*  GLUCOSE 105* 78 97 117* 133*  BUN 17  34*  CREATININE 0.63 0.65 0.54* 0.63 0.62  CALCIUM 9.1 8.5* 9.0 8.9 8.7*  MG 1.7  --   --   --   --    GFR: Estimated Creatinine Clearance: 42.8 mL/min (by C-G formula based on SCr of 0.62 mg/dL). Liver Function Tests: Recent Labs  Lab Aug 08, 2020 1724  AST 17  ALT 15  ALKPHOS 86  BILITOT 1.1  PROT 5.6*  ALBUMIN 2.7*   No results for input(s): LIPASE, AMYLASE in the last 168 hours. No results for input(s): AMMONIA in the last 168 hours. Coagulation Profile: No results for input(s): INR, PROTIME in the last 168 hours. Cardiac Enzymes: No results for input(s): CKTOTAL, CKMB, CKMBINDEX, TROPONINI in the last 168 hours. BNP (last 3 results) No results for input(s): PROBNP in the last 8760  hours. HbA1C: No results for input(s): HGBA1C in the last 72 hours. CBG: No results for input(s): GLUCAP in the last 168 hours. Lipid Profile: No results for input(s): CHOL, HDL, LDLCALC, TRIG, CHOLHDL, LDLDIRECT in the last 72 hours. Thyroid Function Tests: No results for input(s): TSH, T4TOTAL, FREET4, T3FREE, THYROIDAB in the last 72 hours. Anemia Panel: No results for input(s): VITAMINB12, FOLATE, FERRITIN, TIBC, IRON, RETICCTPCT in the last 72 hours. Urine analysis:    Component Value Date/Time   COLORURINE YELLOW (A) 08-08-20 1702   APPEARANCEUR HAZY (A) 08-Aug-2020 1702   LABSPEC 1.019 08/08/20 1702   PHURINE 5.0 Aug 08, 2020 1702   GLUCOSEU NEGATIVE 08-08-20 1702   HGBUR NEGATIVE Aug 08, 2020 1702   BILIRUBINUR NEGATIVE Aug 08, 2020 1702   KETONESUR 20 (A) 08-08-2020 1702   PROTEINUR NEGATIVE Aug 08, 2020 1702   NITRITE NEGATIVE 08-08-20 1702   LEUKOCYTESUR NEGATIVE 08-Aug-2020 1702     Jedidiah Demartini M.D. [facility] 08/01/2020, 2:23 PM  Available via Epic secure chat 7am-7pm After 7 pm, please refer to night coverage provider listed on amion.

## 2020-08-01 NOTE — Progress Notes (Signed)
   08/01/20 1330  Clinical Encounter Type  Visited With Patient and family together  Visit Type Initial;Follow-up  Referral From Nurse  Consult/Referral To Chaplain  Spiritual Encounters  Spiritual Needs Brochure  Chaplain Janard Culp and [place] completed one AD for room 254, Pt Roger Gutierrez.

## 2020-08-02 ENCOUNTER — Inpatient Hospital Stay: Payer: Medicare PPO

## 2020-08-02 DIAGNOSIS — J9601 Acute respiratory failure with hypoxia: Secondary | ICD-10-CM

## 2020-08-02 DIAGNOSIS — J690 Pneumonitis due to inhalation of food and vomit: Secondary | ICD-10-CM

## 2020-08-02 DIAGNOSIS — R6520 Severe sepsis without septic shock: Secondary | ICD-10-CM

## 2020-08-02 DIAGNOSIS — A419 Sepsis, unspecified organism: Secondary | ICD-10-CM

## 2020-08-02 LAB — CBC
HCT: 47.6 % (ref 39.0–52.0)
Hemoglobin: 15.9 g/dL (ref 13.0–17.0)
MCH: 31.7 pg (ref 26.0–34.0)
MCHC: 33.4 g/dL (ref 30.0–36.0)
MCV: 94.8 fL (ref 80.0–100.0)
Platelets: 702 10*3/uL — ABNORMAL HIGH (ref 150–400)
RBC: 5.02 MIL/uL (ref 4.22–5.81)
RDW: 14.1 % (ref 11.5–15.5)
WBC: 27.8 10*3/uL — ABNORMAL HIGH (ref 4.0–10.5)
nRBC: 0 % (ref 0.0–0.2)

## 2020-08-02 LAB — BASIC METABOLIC PANEL
Anion gap: 17 — ABNORMAL HIGH (ref 5–15)
BUN: 79 mg/dL — ABNORMAL HIGH (ref 8–23)
CO2: 32 mmol/L (ref 22–32)
Calcium: 9.3 mg/dL (ref 8.9–10.3)
Chloride: 86 mmol/L — ABNORMAL LOW (ref 98–111)
Creatinine, Ser: 1.86 mg/dL — ABNORMAL HIGH (ref 0.61–1.24)
GFR, Estimated: 37 mL/min — ABNORMAL LOW (ref >60)
Glucose, Bld: 159 mg/dL — ABNORMAL HIGH (ref 70–99)
Potassium: 4.5 mmol/L (ref 3.5–5.1)
Sodium: 135 mmol/L (ref 135–145)

## 2020-08-02 LAB — GLUCOSE, CAPILLARY: Glucose-Capillary: 163 mg/dL — ABNORMAL HIGH (ref 70–99)

## 2020-08-02 LAB — CBC WITH DIFFERENTIAL/PLATELET
Abs Immature Granulocytes: 0.12 10*3/uL — ABNORMAL HIGH (ref 0.00–0.07)
Basophils Absolute: 0.1 10*3/uL (ref 0.0–0.1)
Basophils Relative: 0 %
Eosinophils Absolute: 0.1 10*3/uL (ref 0.0–0.5)
Eosinophils Relative: 0 %
HCT: 46.9 % (ref 39.0–52.0)
Hemoglobin: 15.8 g/dL (ref 13.0–17.0)
Immature Granulocytes: 0 %
Lymphocytes Relative: 2 %
Lymphs Abs: 0.5 10*3/uL — ABNORMAL LOW (ref 0.7–4.0)
MCH: 32.3 pg (ref 26.0–34.0)
MCHC: 33.7 g/dL (ref 30.0–36.0)
MCV: 95.9 fL (ref 80.0–100.0)
Monocytes Absolute: 0.7 10*3/uL (ref 0.1–1.0)
Monocytes Relative: 3 %
Neutro Abs: 26.6 10*3/uL — ABNORMAL HIGH (ref 1.7–7.7)
Neutrophils Relative %: 95 %
Platelets: 689 10*3/uL — ABNORMAL HIGH (ref 150–400)
RBC: 4.89 MIL/uL (ref 4.22–5.81)
RDW: 14 % (ref 11.5–15.5)
Smear Review: NORMAL
WBC: 28.1 10*3/uL — ABNORMAL HIGH (ref 4.0–10.5)
nRBC: 0 % (ref 0.0–0.2)

## 2020-08-02 LAB — LACTIC ACID, PLASMA: Lactic Acid, Venous: 4.5 mmol/L (ref 0.5–1.9)

## 2020-08-02 LAB — PHOSPHORUS: Phosphorus: 4.7 mg/dL — ABNORMAL HIGH (ref 2.5–4.6)

## 2020-08-02 LAB — TROPONIN I (HIGH SENSITIVITY): Troponin I (High Sensitivity): 15 ng/L (ref <18)

## 2020-08-02 LAB — BRAIN NATRIURETIC PEPTIDE: B Natriuretic Peptide: 68 pg/mL (ref 0.0–100.0)

## 2020-08-02 LAB — MAGNESIUM: Magnesium: 2.2 mg/dL (ref 1.7–2.4)

## 2020-08-02 MED ORDER — VANCOMYCIN HCL 1000 MG/200ML IV SOLN
1000.0000 mg | Freq: Once | INTRAVENOUS | Status: AC
  Administered 2020-08-02: 1000 mg via INTRAVENOUS
  Filled 2020-08-02: qty 200

## 2020-08-02 MED ORDER — FUROSEMIDE 10 MG/ML IJ SOLN: 40.0000 mg | Freq: Once | INTRAMUSCULAR | Status: DC

## 2020-08-02 MED ORDER — VANCOMYCIN HCL 750 MG/150ML IV SOLN: 750.0000 mg | INTRAVENOUS | Status: DC

## 2020-08-02 MED ORDER — SODIUM CHLORIDE 0.9 % IV BOLUS
1140.0000 mL | Freq: Once | INTRAVENOUS | Status: AC
  Administered 2020-08-02: 1140 mL via INTRAVENOUS

## 2020-08-02 MED ORDER — SODIUM CHLORIDE 0.9 % IV SOLN
3.0000 g | Freq: Four times a day (QID) | INTRAVENOUS | Status: DC
  Administered 2020-08-02: 3 g via INTRAVENOUS
  Filled 2020-08-02 (×2): qty 8

## 2020-08-02 MED ORDER — SODIUM CHLORIDE 0.9 % IV SOLN
500.0000 mg | INTRAVENOUS | Status: DC
  Administered 2020-08-02: 500 mg via INTRAVENOUS
  Filled 2020-08-02 (×2): qty 500

## 2020-08-02 MED ORDER — METHYLPREDNISOLONE SODIUM SUCC 40 MG IJ SOLR
40.0000 mg | Freq: Two times a day (BID) | INTRAMUSCULAR | Status: DC
  Administered 2020-08-02: 40 mg via INTRAVENOUS
  Filled 2020-08-02: qty 1

## 2020-08-02 MED ORDER — IPRATROPIUM-ALBUTEROL 0.5-2.5 (3) MG/3ML IN SOLN: 3.0000 mL | RESPIRATORY_TRACT | Status: DC | PRN

## 2020-08-02 MED ORDER — IPRATROPIUM-ALBUTEROL 0.5-2.5 (3) MG/3ML IN SOLN
RESPIRATORY_TRACT | Status: AC
  Filled 2020-08-02: qty 3

## 2020-08-02 MED ORDER — MORPHINE SULFATE (PF) 2 MG/ML IV SOLN
1.0000 mg | Freq: Once | INTRAVENOUS | Status: AC
  Administered 2020-08-02: 1 mg via INTRAVENOUS
  Filled 2020-08-02: qty 1

## 2020-08-02 MED ORDER — MORPHINE SULFATE (PF) 2 MG/ML IV SOLN
1.0000 mg | Freq: Once | INTRAVENOUS | Status: AC
Start: 2020-08-02 — End: 2020-08-02
  Administered 2020-08-02: 1 mg via INTRAVENOUS
  Filled 2020-08-02: qty 1

## 2020-08-02 MED ORDER — PIPERACILLIN-TAZOBACTAM 3.375 G IVPB
3.3750 g | Freq: Three times a day (TID) | INTRAVENOUS | Status: DC
  Administered 2020-08-02: 3.375 g via INTRAVENOUS
  Filled 2020-08-02: qty 50

## 2020-08-08 ENCOUNTER — Ambulatory Visit: Payer: Medicare PPO | Admitting: Family

## 2020-08-09 ENCOUNTER — Ambulatory Visit: Payer: Medicare PPO | Admitting: Family Medicine

## 2020-08-23 NOTE — Progress Notes (Signed)
Pharmacy Antibiotic Note  Roger Gutierrez is a 80 y.o. male admitted on 08/09/2020 with sepsis.  Pharmacy has been consulted for Zosyn, Vanc dosing.  Plan: Zosyn 3.375 gm IV Q8H EI ordered to start on 5/11 @ 0630.   Vancomycin 1 gm IV X 1 ordered for 5/11 @ ~ 0600. Vanomycin 750 mg IV Q24H ordered to start on 5/11 @ 0700.   AUC = 458.3 Vanc trough = 9.1   Height: 5\' 5"  (165.1 cm) Weight: 38.3 kg (84 lb 7 oz) IBW/kg (Calculated) : 61.5  Temp (24hrs), Avg:98 F (36.7 C), Min:97.7 F (36.5 C), Max:98.2 F (36.8 C)  Recent Labs  Lab 08/10/2020 1724 07/29/20 0436 07/30/20 0428 07/31/20 0556 08/01/20 0517 08/13/20 0448  WBC 11.5*  --   --   --   --  27.8*  CREATININE 0.63 0.65 0.54* 0.63 0.62  --     Estimated Creatinine Clearance: 41.2 mL/min (by C-G formula based on SCr of 0.62 mg/dL).    Allergies  Allergen Reactions  . Megace  [Megestrol] Shortness Of Breath and Palpitations    Antimicrobials this admission:   >>    >>   Dose adjustments this admission:   Microbiology results:  BCx:   UCx:    Sputum:    MRSA PCR:   Thank you for allowing pharmacy to be a part of this patient's care.  Kennie Snedden D 08-13-2020 5:42 AM

## 2020-08-23 NOTE — Progress Notes (Signed)
Cross Cover  Roger Gutierrez is heere has past history of COPD (active smoker), hypertension CAD osteoporosis, significant scoliosis who presented to the ED with weakness and shortness of breath. He was admitted for acute resp failure from heart failure exacerbation with BNP 185,  Initial troponin 28, no trend, and chest xray showed  cardiomegaly with bibasilar atelectasis left greater than right, small bilateral pleural effusions Significant decline in respiratory status overnight.    White count 27. 8 up from 11.5 checked 5 days ago ECHO EF 55-60 % with grade 1 diastolic dysfunction He has been diuresed with IV lasix therapy since 5/6, to transition to oral therapy today Worsening oral intake for 2 days per daughter report Lactic acid 4.5 Discussed with patient initially had refused bipap. As respiratory status worsened  he did agree to try BIPAP but refused transfer to ICU  Daughter informed via phone decline in status and poor prognosis. She is discussing with her brother and feels if things do not turn around quickly, plan to transition to hospice care  Severe Sepsis elevated heart failure, respiratory failure, leukocytosis with PNA as source Lactic 4.5 - trend - 30 x 38 = 1140( heart failure history, however, he does have normal EF and good right ventricular function BNP now 68 with evidence of metabolic alkalosis on chem panel indicating fluid need) Start on zosyn and vanc (mrsa swab pcr, d/c vanc if negative)  Acute on chronic respiratory failure As above BIPAP support, vanc zosyn - confirmed with patient and daughter no escalation of respiratory support  Heart failure Hold further diuresis at this time  CAD  repeat troponin, monitor  COPD  Likely exacerbation  Steroids + nebs  Benign essential hypertension Hold antihypertesive drugs  Scoliosis with chronic back pain Morphine for pain and air hunger  Nicotine addiction On patch  Care discussion Discussed with daughter over  phone decline in resp status - high mortalkity trtansition to hospice care likely

## 2020-08-23 NOTE — Progress Notes (Signed)
Pt pronounced dead 08/06/2020 at 07:21 by Margaretmary Dys, RN and Jenetta Loges, RN/ Dr . Renae Gloss and Nursing Supervisor, Annice Pih, made aware/ family at bedside with pt.

## 2020-08-23 NOTE — Death Summary Note (Signed)
DEATH SUMMARY   Patient Details  Name: Roger Gutierrez MRN: [id number] DOB: 07-21-1940  Admission/Discharge Information   Admit Date:  08/01/2020  Date of Death: Date of Death: 08/18/2020  Time of Death: Time of Death: 0721  Length of Stay: 5  Referring Physician: Malva LimesFisher, Donald E, MD   Reason(s) for Hospitalization  Admitted with congestive heart failure  Diagnoses  Preliminary cause of death:  Secondary Diagnoses (including complications and co-morbidities):  Acute diastolic congestive heart failure Severe sepsis with aspiration pneumonia and pleural effusion Acute hypoxic respiratory failure L5 compression fracture Hyperlipidemia Essential hypertension COPD Severe protein calorie malnutrition  Brief Hospital Course (including significant findings, care, treatment, and services provided and events leading to death)  Roger Gutierrez is a 80 y.o. year old male who was admitted on 07/29/2020 with acute diastolic congestive heart failure exacerbation.  Patient was diuresed.  Patient was seen by cardiology Dr. Lady GaryFath.  Patient was also seen by Dr. Rosita KeaMenz for an L5 compression fracture.  Patient was found to have severe protein calorie malnutrition and was not eating well for the past few days.  Early morning on 08/13/2020 the patient had worsening status.  Patient had a pneumonia with pleural effusion elevated lactic acid and was started on antibiotics for severe sepsis.  Patient had acute on chronic respiratory failure and required BiPAP.  The patient was converted over to comfort measures and was made a DO NOT RESUSCITATE.  The patient passed away before I saw him.    Pertinent Labs and Studies  Significant Diagnostic Studies DG Chest 2 View  Result Date: 08/16/2020 CLINICAL DATA:  Generalized weakness and lethargy. EXAM: CHEST - 2 VIEW COMPARISON:  November 10, 2012 FINDINGS: Chronic appearing increased lung markings are seen with mild to moderate severity atelectasis and/or infiltrate noted  within the left lung base. Mild right basilar atelectasis and/or early infiltrate is also seen. Very small bilateral pleural effusions are noted. No pneumothorax is identified. The cardiac silhouette is moderately enlarged. Marked severity S-shaped scoliosis of the thoracolumbar spine is seen. IMPRESSION: 1. Cardiomegaly with bibasilar atelectasis and/or infiltrate, left greater than right. 2. Very small bilateral pleural effusions. Electronically Signed   By: Aram Candelahaddeus  Houston M.D.   On: December 14, 2020 18:00   DG Lumbar Spine 2-3 Views  Result Date: 07/15/2020 CLINICAL DATA:  Pain after lifting injury 2 days ago. Pain radiates to the left side. EXAM: LUMBAR SPINE - 2-3 VIEW COMPARISON:  None. FINDINGS: No visible fracture, erosion, or bone lesion. Marked lumbar levoscoliosis. Generalized osteopenia. Prominent arterial calcification of the aorta. IMPRESSION: No acute finding, although sensitivity is degraded by osteopenia and marked levoscoliosis Electronically Signed   By: Marnee SpringJonathon  Watts M.D.   On: 07/15/2020 11:05   CT Lumbar Spine Wo Contrast  Result Date: 07/15/2020 CLINICAL DATA:  Low back pain.  Increased fracture risk. EXAM: CT LUMBAR SPINE WITHOUT CONTRAST TECHNIQUE: Multidetector CT imaging of the lumbar spine was performed without intravenous contrast administration. Multiplanar CT image reconstructions were also generated. COMPARISON:  Lumbar spine radiographs 07/15/2020. CT abdomen and pelvis 04/12/2009. FINDINGS: Segmentation: 5 lumbar type vertebrae. Alignment: Severe levoscoliosis with apex at L3. No significant listhesis in the sagittal plane. Vertebrae: Diffuse osteopenia. No fracture or destructive osseous process. Subcentimeter sclerotic focus in the L3 vertebral body, also present in 2011 and likely reflecting a bone island. Paraspinal and other soft tissues: Extensive abdominal aortic atherosclerosis without aneurysm. Asymmetric right-sided paraspinal muscle atrophy. Disc levels: Advanced  asymmetric right-sided disc space narrowing at L3-4 with  endplate and facet spurring resulting in moderate right neural foraminal stenosis. Moderate right neural foraminal stenosis at L4-5 and mild-to-moderate left neural foraminal stenosis at L5-S1 due to disc bulging and facet spurring. No evidence of significant lumbar spinal canal stenosis. IMPRESSION: 1. No evidence of acute osseous abnormality. 2. Severe lumbar levoscoliosis. 3. Moderate right neural foraminal stenosis at L3-4 and L4-5. No evidence of significant spinal stenosis. 4. Aortic Atherosclerosis (ICD10-I70.0). Electronically Signed   By: Sebastian Ache M.D.   On: 07/15/2020 12:36   US Venous Img Lower Bilateral (DVT)  Result Date: 07/29/2020 CLINICAL DATA:  80 year old male with bilateral lower extremity edema for the past week EXAM: BILATERAL LOWER EXTREMITY VENOUS DOPPLER ULTRASOUND TECHNIQUE: Gray-scale sonography with graded compression, as well as color Doppler and duplex ultrasound were performed to evaluate the lower extremity deep venous systems from the level of the common femoral vein and including the common femoral, femoral, profunda femoral, popliteal and calf veins including the posterior tibial, peroneal and gastrocnemius veins when visible. The superficial great saphenous vein was also interrogated. Spectral Doppler was utilized to evaluate flow at rest and with distal augmentation maneuvers in the common femoral, femoral and popliteal veins. COMPARISON:  None. FINDINGS: RIGHT LOWER EXTREMITY Common Femoral Vein: No evidence of thrombus. Normal compressibility, respiratory phasicity and response to augmentation. Saphenofemoral Junction: No evidence of thrombus. Normal compressibility and flow on color Doppler imaging. Profunda Femoral Vein: No evidence of thrombus. Normal compressibility and flow on color Doppler imaging. Femoral Vein: No evidence of thrombus. Normal compressibility, respiratory phasicity and response to  augmentation. Popliteal Vein: No evidence of thrombus. Normal compressibility, respiratory phasicity and response to augmentation. Calf Veins: No evidence of thrombus. Normal compressibility and flow on color Doppler imaging. Superficial Great Saphenous Vein: No evidence of thrombus. Normal compressibility. Venous Reflux:  None. Other Findings:  None. LEFT LOWER EXTREMITY Common Femoral Vein: No evidence of thrombus. Normal compressibility, respiratory phasicity and response to augmentation. Saphenofemoral Junction: No evidence of thrombus. Normal compressibility and flow on color Doppler imaging. Profunda Femoral Vein: No evidence of thrombus. Normal compressibility and flow on color Doppler imaging. Femoral Vein: No evidence of thrombus. Normal compressibility, respiratory phasicity and response to augmentation. Popliteal Vein: No evidence of thrombus. Normal compressibility, respiratory phasicity and response to augmentation. Calf Veins: No evidence of thrombus. Normal compressibility and flow on color Doppler imaging. Superficial Great Saphenous Vein: No evidence of thrombus. Normal compressibility. Venous Reflux:  None. Other Findings:  None. IMPRESSION: No evidence of deep venous thrombosis in either lower extremity. Electronically Signed   By: Malachy Moan M.D.   On: 07/29/2020 13:34   DG Chest Port 1 View  Result Date: 07/24/2020 CLINICAL DATA:  Hypoxia EXAM: PORTABLE CHEST 1 VIEW COMPARISON:  Chest CT from 4 days ago FINDINGS: Marked worsening of aeration on the right the appearance of of airspace disease and pleural fluid. Similar indistinct opacity at the left lung base. Normal heart size and aortic contours. Negative for pneumothorax. No pulmonary edema. Marked scoliosis. IMPRESSION: New extensive opacification of the right lung, likely a combination of pleural fluid and airspace disease. Given the focality, pneumonia is favored. Electronically Signed   By: Marnee Spring M.D.   On: 08/04/2020  04:03   ECHOCARDIOGRAM COMPLETE  Result Date: 07/29/2020    ECHOCARDIOGRAM REPORT   Patient Name:   Roger Gutierrez Date of Exam: 07/29/2020 Medical Rec #:  [id number]      Height:       65.0 in Accession #:  [id number]     Weight:       100.3 lb Date of Birth:  [birth year]      BSA:          1.475 m Patient Age:    77 years       BP:           133/93 mmHg Patient Gender: M              HR:           94 bpm. Exam Location:  [facility] Procedure: 2D Echo Indications:     Diastolic CHF 150.31  History:         Patient has no prior history of Echocardiogram examinations.                  CAD, COPD; Risk Factors:Hypertension.  Sonographer:     L Thornton-Maynard Referring Phys:  [id number] Rometta Emery Diagnosing Phys: Kristeen Miss MD  Sonographer Comments: Image acquisition challenging due to patient body habitus. IMPRESSIONS  1. Left ventricular ejection fraction, by estimation, is 55 to 60%. The left ventricle has normal function. The left ventricle has no regional wall motion abnormalities. Left ventricular diastolic parameters are consistent with Grade I diastolic dysfunction (impaired relaxation).  2. Right ventricular systolic function is normal. The right ventricular size is normal.  3. The mitral valve was not well visualized. No evidence of mitral valve regurgitation.  4. The aortic valve is grossly normal. Aortic valve regurgitation is not visualized. No aortic stenosis is present.  5. The echo is technically difficult and the aorta is not seen very well but it appears to be moderately - markedly dilated. Suggest CTA for better imaging of the aorta . Marland Kitchen Aortic dilatation noted. There is moderate dilatation of the aortic root, measuring 46 mm. FINDINGS  Left Ventricle: Left ventricular ejection fraction, by estimation, is 55 to 60%. The left ventricle has normal function. The left ventricle has no regional wall motion abnormalities. The left ventricular internal cavity size was normal in size. There is  no left  ventricular hypertrophy. Left ventricular diastolic parameters are consistent with Grade I diastolic dysfunction (impaired relaxation). Right Ventricle: The right ventricular size is normal. No increase in right ventricular wall thickness. Right ventricular systolic function is normal. Left Atrium: Left atrial size was normal in size. Right Atrium: Right atrial size was normal in size. Pericardium: There is no evidence of pericardial effusion. Mitral Valve: The mitral valve was not well visualized. No evidence of mitral valve regurgitation. MV peak gradient, 3.4 mmHg. The mean mitral valve gradient is 2.0 mmHg. Tricuspid Valve: The tricuspid valve is not well visualized. Tricuspid valve regurgitation is not demonstrated. Aortic Valve: The aortic valve is grossly normal. Aortic valve regurgitation is not visualized. No aortic stenosis is present. Aortic valve mean gradient measures 2.0 mmHg. Aortic valve peak gradient measures 4.0 mmHg. Aortic valve area, by VTI measures 5.61 cm. Pulmonic Valve: The pulmonic valve was not well visualized. Pulmonic valve regurgitation is not visualized. Aorta: The echo is technically difficult and the aorta is not seen very well but it appears to be moderately - markedly dilated. Suggest CTA for better imaging of the aorta. Aortic dilatation noted. There is moderate dilatation of the aortic root, measuring 46 mm. IAS/Shunts: The atrial septum is grossly normal.  LEFT VENTRICLE PLAX 2D LVIDd:         3.94 cm  Diastology LVIDs:         2.74 cm  LV e' medial:    4.35 cm/s LV PW:         0.92 cm  LV E/e' medial:  10.0 LV IVS:        1.20 cm  LV e' lateral:   5.87 cm/s LVOT diam:     2.50 cm  LV E/e' lateral: 7.4 LV SV:         90 LV SV Index:   61 LVOT Area:     4.91 cm  RIGHT VENTRICLE RV S prime:     15.80 cm/s TAPSE (M-mode): 3.0 cm LEFT ATRIUM             Index LA diam:        2.90 cm 1.97 cm/m LA Vol (A2C):   30.6 ml 20.74 ml/m LA Vol (A4C):   48.8 ml 33.08 ml/m LA Biplane Vol:  38.8 ml 26.30 ml/m  AORTIC VALVE                   PULMONIC VALVE AV Area (Vmax):    5.01 cm    PV Vmax:       0.98 m/s AV Area (Vmean):   5.13 cm    PV Peak grad:  3.8 mmHg AV Area (VTI):     5.61 cm AV Vmax:           100.00 cm/s AV Vmean:          70.400 cm/s AV VTI:            0.160 m AV Peak Grad:      4.0 mmHg AV Mean Grad:      2.0 mmHg LVOT Vmax:         102.00 cm/s LVOT Vmean:        73.600 cm/s LVOT VTI:          0.183 m LVOT/AV VTI ratio: 1.14  AORTA Ao Root diam: 4.60 cm MITRAL VALVE MV Area VTI:  5.87 cm     SHUNTS MV Peak grad: 3.4 mmHg     Systemic VTI:  0.18 m MV Mean grad: 2.0 mmHg     Systemic Diam: 2.50 cm MV Vmax:      0.93 m/s MV Vmean:     60.4 cm/s MV E velocity: 43.30 cm/s MV A velocity: 78.00 cm/s MV E/A ratio:  0.56 Kristeen Miss MD Electronically signed by Kristeen Miss MD Signature Date/Time: 07/29/2020/9:06:01 AM    Final    CT Angio Chest/Abd/Pel for Dissection W and/or W/WO  Result Date: 07/29/2020 CLINICAL DATA:  80 year old male with suspected thoracic aortic aneurysm. EXAM: CT ANGIOGRAPHY CHEST, ABDOMEN AND PELVIS TECHNIQUE: A noncontrast chest CT was initially performed. Multidetector CT imaging through the chest, abdomen and pelvis was performed using the standard protocol during bolus administration of intravenous contrast. Multiplanar reconstructed images and MIPs were obtained and reviewed to evaluate the vascular anatomy. CONTRAST:  21mL OMNIPAQUE IOHEXOL 350 MG/ML SOLN COMPARISON:  CT lumbar spine from 07/15/2020, CT abdomen pelvis from 04/12/2009 FINDINGS: CTA CHEST FINDINGS VASCULAR Preferential opacification of the thoracic aorta. No evidence of thoracic aortic aneurysm or dissection. Scattered atherosclerotic calcifications. Normal heart size. No pericardial effusion. Sinues of Valsalva: 35 mm 36 x 34 mm Sinotubular Junction: 34 mm Ascending Aorta: 38 mm Aortic Arch: 25 mm Descending aorta: 25 mm at the level of the carina Branch vessels: Conventional branching  pattern. No significant atherosclerotic changes. Coronary arteries: Normal origins and courses. Severe atherosclerotic calcifications with proximal LAD stent in place. Main  pulmonary artery: 27 mm. No evidence of central pulmonary embolism. Pulmonary veins: No anomalous pulmonary venous return. No evidence of left atrial appendage thrombus. Review of the MIP images confirms the above findings. NON VASCULAR Mediastinum/Nodes: No enlarged mediastinal, hilar, or axillary lymph nodes. Thyroid gland, trachea, and esophagus demonstrate no significant findings. Lungs/Pleura: Secretions are noted within the carina extending into the left mainstem and lobar bronchi. Severe centrilobular and paraseptal emphysema with multifocal bullous changes in the lingula and right middle lobe. No focal consolidations or suspicious pulmonary nodules. 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 Diffuse osteopenia. No acute osseous abnormality. CTA ABDOMEN AND PELVIS FINDINGS VASCULAR Aorta: Caliber and patent throughout with coarse circumferential fibrofatty and calcific atherosclerotic changes in the infrarenal portion. Celiac: Patent without evidence of aneurysm, dissection, vasculitis or significant stenosis. SMA: Patent without evidence of aneurysm, dissection, vasculitis or significant stenosis. Renals: Single bilateral renal arteries are patent without evidence of aneurysm, dissection, vasculitis, fibromuscular dysplasia or significant stenosis. IMA: Patent without evidence of aneurysm, dissection, vasculitis or significant stenosis. Inflow: Scattered, nearly circumferential calcific atherosclerotic calcifications. At least moderate focal stenosis about the proximal left external iliac artery secondary to atherosclerotic plaque. Veins: No obvious venous abnormality within the limitations of this arterial phase study. Review of the MIP images confirms  the above findings. NON-VASCULAR Hepatobiliary: No focal liver abnormality is seen. No gallstones, gallbladder wall thickening, or biliary dilatation. Pancreas: Unremarkable. No pancreatic ductal dilatation or surrounding inflammatory changes. Spleen: Normal in size without focal abnormality. Adrenals/Urinary Tract: Adrenal glands are not definitively visualized. Focal cortical thinning about the anterior aspect of the left inferior pole renal cortex. Kidneys are otherwise normal, without renal calculi, focal lesion, or hydronephrosis. Bladder is unremarkable. Stomach/Bowel: Stomach is within normal limits. Appendix is not definitively visualized. Moderate colonic stool burden. No evidence of bowel wall thickening, distention, or inflammatory changes. Lymphatic: No abdominopelvic lymphadenopathy. Reproductive: Prostate is unremarkable. Other: No abdominal wall hernia or abnormality. No abdominopelvic ascites. Musculoskeletal: Interval development of compression deformity of the L5 vertebral body with approximately 60% vertebral body height loss. No evidence of significant retropulsion. Diffuse osteopenia. Similar appearing sigmoid curvature of the thoracolumbar spine. IMPRESSION: VASCULAR 1. No evidence of thoracic or abdominal aortic aneurysm. 2.  Aortic Atherosclerosis (ICD10-I70.0). NON VASCULAR 1. Acute to subacute compression fracture of the L5 vertebral body with approximately 60% height loss. No evidence of significant retropulsion. Consider MRI lumbar spine for further characterization. 2. Moderate colonic stool burden. 3. Secretions noted in the distal trachea and left proximal bronchi. No definite evidence of pneumonia. 4. Severe emphysema (ICD10-J43.9). These results will be called to the ordering clinician or representative by the Radiologist Assistant, and communication documented in the PACS or [employer]. Marliss Coots, MD [facility]  Electronically Signed   By: Marliss Coots MD   On: 07/29/2020 14:59    Microbiology Recent Results (from the past 240 hour(s))  Resp Panel by RT-PCR (Flu A&B, Covid) Nasopharyngeal Swab     Status: None   Collection Time: 08/18/2020  8:34 PM   Specimen: Nasopharyngeal Swab; Nasopharyngeal(NP) swabs in vial transport medium  Result Value Ref Range Status   SARS Coronavirus 2 by RT PCR NEGATIVE NEGATIVE Final    Comment: (NOTE) SARS-CoV-2 target nucleic acids are NOT DETECTED.  The SARS-CoV-2 RNA is generally detectable in upper respiratory specimens during the acute phase of infection. The lowest concentration of SARS-CoV-2 viral copies this assay can detect  is 138 copies/mL. A negative result does not preclude SARS-Cov-2 infection and should not be used as the sole basis for treatment or other patient management decisions. A negative result may occur with  improper specimen collection/handling, submission of specimen other than nasopharyngeal swab, presence of viral mutation(s) within the areas targeted by this assay, and inadequate number of viral copies(<138 copies/mL). A negative result must be combined with clinical observations, patient history, and epidemiological information. The expected result is Negative.  Fact Sheet for Patients:  BloggerCourse.com  Fact Sheet for Healthcare Providers:  SeriousBroker.it  This test is no t yet approved or cleared by the [place] FDA and  has been authorized for detection and/or diagnosis of SARS-CoV-2 by FDA under an Emergency Use Authorization (EUA). This EUA will remain  in effect (meaning this test can be used) for the duration of the COVID-19 declaration under Section 564(b)(1) of the Act, 21 U.S.C.section 360bbb-3(b)(1), unless the authorization is terminated  or revoked sooner.       Influenza A by PCR NEGATIVE NEGATIVE Final   Influenza B by PCR NEGATIVE NEGATIVE Final     Comment: (NOTE) The Xpert Xpress SARS-CoV-2/FLU/RSV plus assay is intended as an aid in the diagnosis of influenza from Nasopharyngeal swab specimens and should not be used as a sole basis for treatment. Nasal washings and aspirates are unacceptable for Xpert Xpress SARS-CoV-2/FLU/RSV testing.  Fact Sheet for Patients: BloggerCourse.com  Fact Sheet for Healthcare Providers: SeriousBroker.it  This test is not yet approved or cleared by the [place] FDA and has been authorized for detection and/or diagnosis of SARS-CoV-2 by FDA under an Emergency Use Authorization (EUA). This EUA will remain in effect (meaning this test can be used) for the duration of the COVID-19 declaration under Section 564(b)(1) of the Act, 21 U.S.C. section 360bbb-3(b)(1), unless the authorization is terminated or revoked.  Performed at [facility], [address] [place], [place] [address]     Lab Basic Metabolic Panel: Recent Labs  Lab Aug 13, 2020 1724 07/29/20 0436 07/30/20 0428 07/31/20 0556 08/01/20 0517 07/25/2020 0448  NA 132* 132* 137 131* 135 135  K 4.1 3.3* 3.7 3.1* 3.1* 4.5  CL 95* 92* 94* 88* 87* 86*  CO2 26 29 31  32 35* 32  GLUCOSE 105* 78 97 117* 133* 159*  BUN 17 17 19 23  34* 79*  CREATININE 0.63 0.65 0.54* 0.63 0.62 1.86*  CALCIUM 9.1 8.5* 9.0 8.9 8.7* 9.3  MG 1.7  --   --   --   --  2.2  PHOS  --   --   --   --   --  4.7*   Liver Function Tests: Recent Labs  Lab 08/13/20 1724  AST 17  ALT 15  ALKPHOS 86  BILITOT 1.1  PROT 5.6*  ALBUMIN 2.7*   CBC: Recent Labs  Lab 08-13-2020 1724 08/13/2020 0448  WBC 11.5* 28.1*  27.8*  NEUTROABS 9.3* 26.6*  HGB 14.8 15.8  15.9  HCT 43.0 46.9  47.6  MCV 92.9 95.9  94.8  PLT 444* 689*  702*   Sepsis Labs: Recent Labs  Lab 08-13-2020 1724 08/08/2020 0448  WBC 11.5* 28.1*  27.8*  LATICACIDVEN  --  4.5*      Bonham Zingale 08/08/2020, 4:45 PM

## 2020-08-23 NOTE — Progress Notes (Signed)
   08/12/2020 0034  Assess: MEWS Score  Temp 98.2 F (36.8 C)  BP 100/70  Pulse Rate (!) 106  Resp 20  SpO2 91 %  O2 Device HFNC  O2 Flow Rate (L/min) 10 L/min  Assess: MEWS Score  MEWS Temp 0  MEWS Systolic 1  MEWS Pulse 1  MEWS RR 0  MEWS LOC 0  MEWS Score 2  MEWS Score Color Yellow  Assess: if the MEWS score is Yellow or Red  Were vital signs taken at a resting state? Yes  Focused Assessment Change from prior assessment (see assessment flowsheet)  Early Detection of Sepsis Score *See Row Information* Low  MEWS guidelines implemented *See Row Information* Yes  Treat  MEWS Interventions Consulted Respiratory Therapy;Escalated (See documentation below);Administered prn meds/treatments  Pain Scale 0-10  Pain Score 0  Take Vital Signs  Increase Vital Sign Frequency  Yellow: Q 2hr X 2 then Q 4hr X 2, if remains yellow, continue Q 4hrs  Escalate  MEWS: Escalate Yellow: discuss with charge nurse/RN and consider discussing with provider and RRT  Notify: Charge Nurse/RN  Name of Charge Nurse/RN Notified Melissa RN  Date Charge Nurse/RN Notified 08/10/2020  Time Charge Nurse/RN Notified 0034  Notify: Provider  Provider Name/Title Manuela Schwartz NP  Date Provider Notified 07/24/2020  Time Provider Notified [phone]  Notification Type Face-to-face  Notification Reason Change in status  Provider response At bedside  Date of Provider Response 08/01/2020  Time of Provider Response 0036  Document  Patient Outcome Stabilized after interventions  Progress note created (see row info) Yes

## 2020-08-23 DEATH — deceased

## 2023-04-05 IMAGING — US US EXTREM LOW VENOUS
1 series · 13 of 24 positions shown · non-contrast
Comparison: None.

CLINICAL DATA: 80-year-old male with bilateral lower extremity
edema for the past week



[Series 1: us venous img lower bilat (dvt) · portal-venous · 13 of 58 slices shown]
[im 1/58]
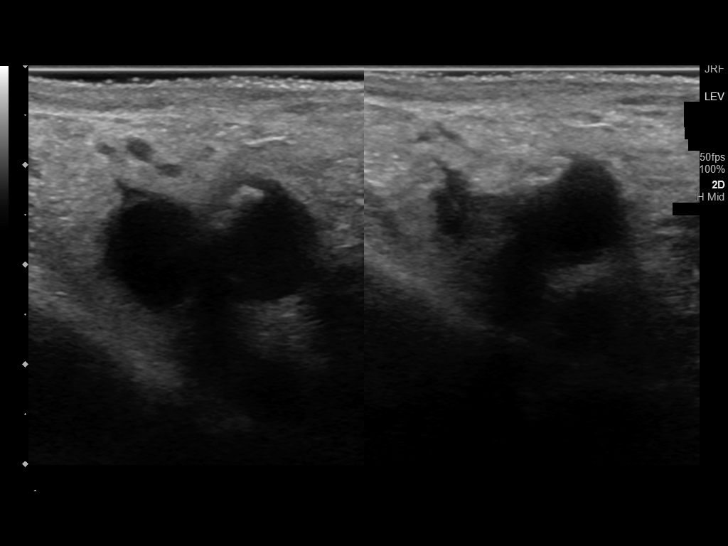
[im 5/58]
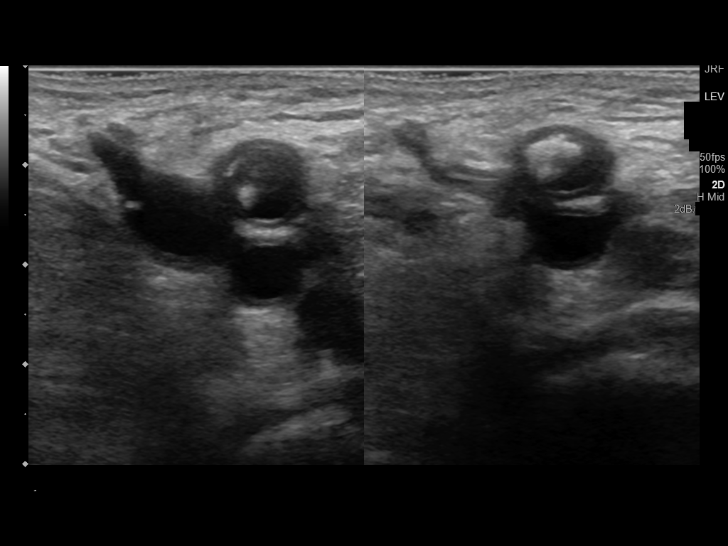
[im 10/58]
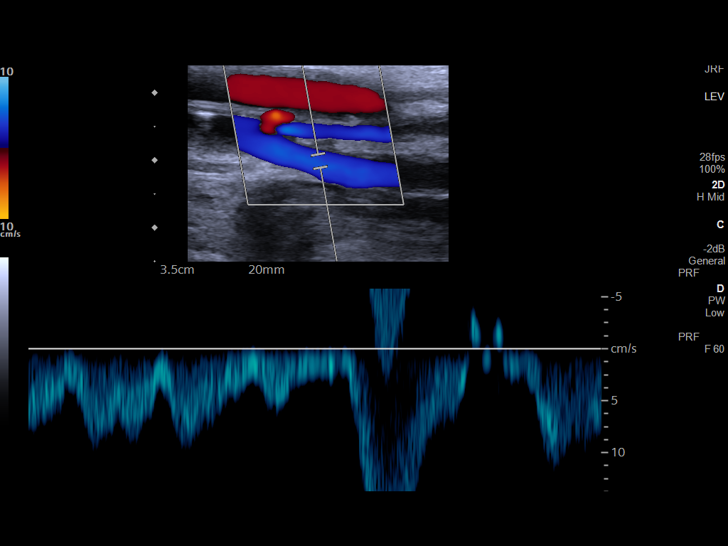
[im 15/58]
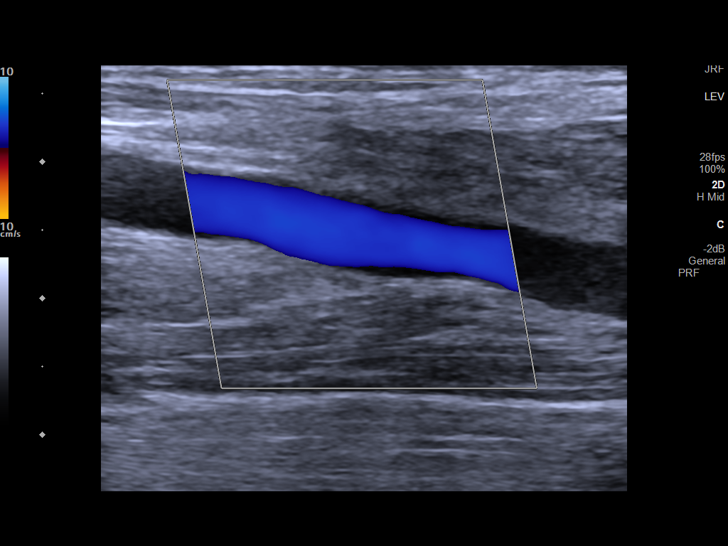
[im 20/58]
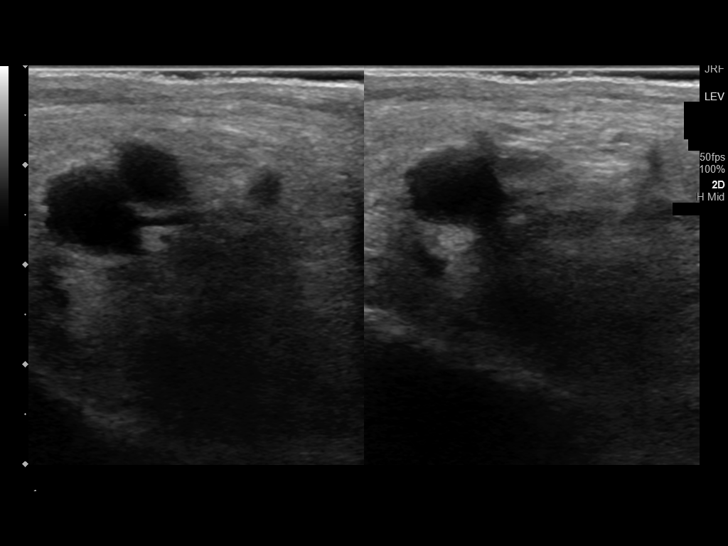
[im 25/58]
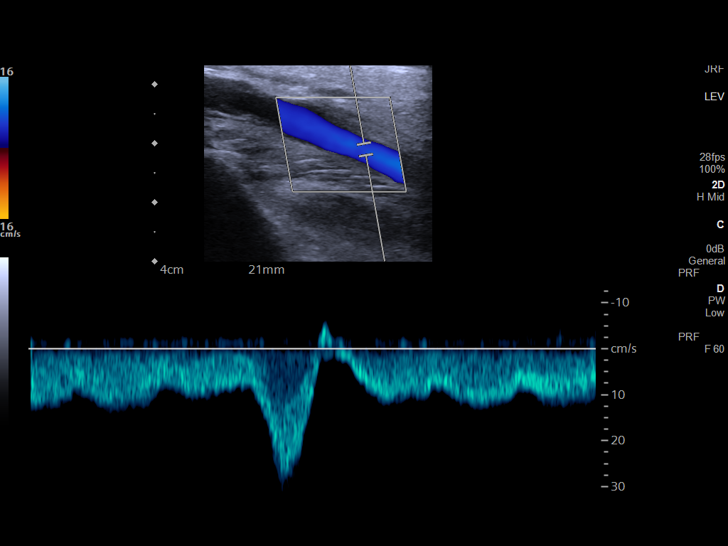
[im 30/58]
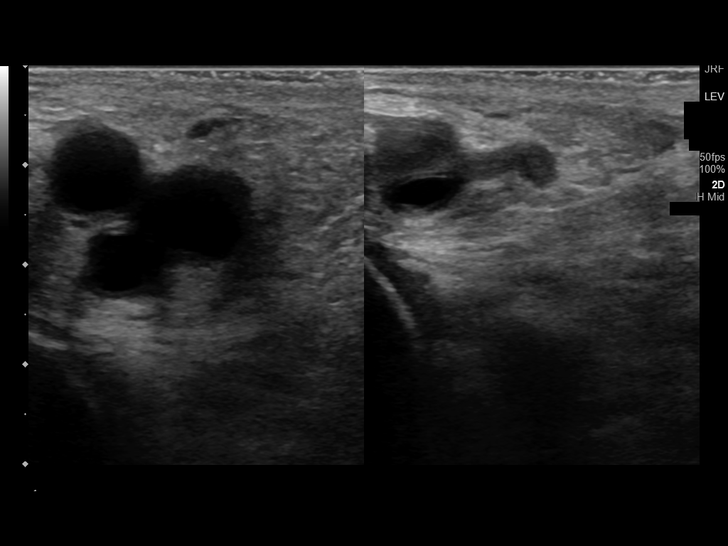
[im 33/58]
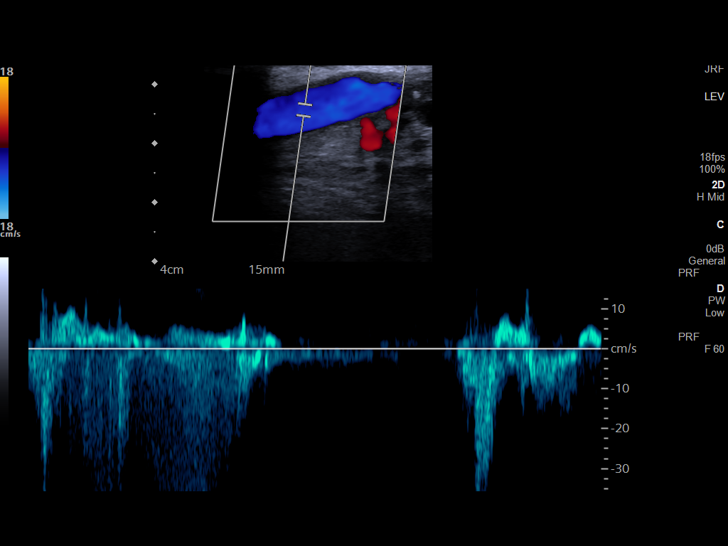
[im 38/58]
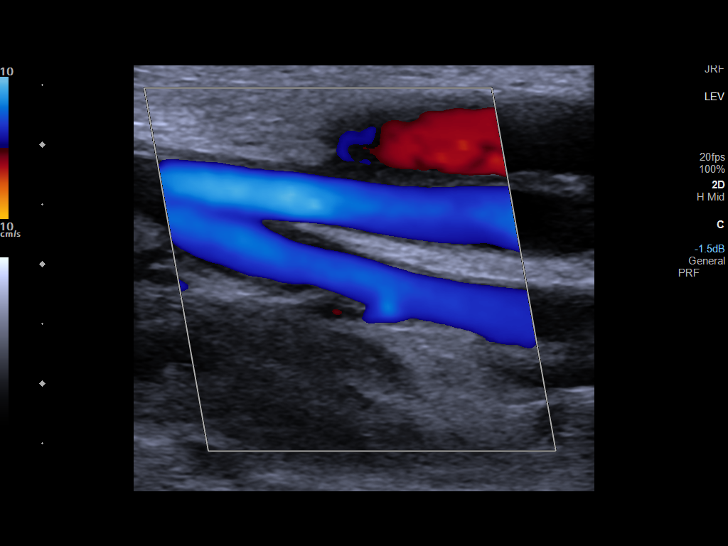
[im 43/58]
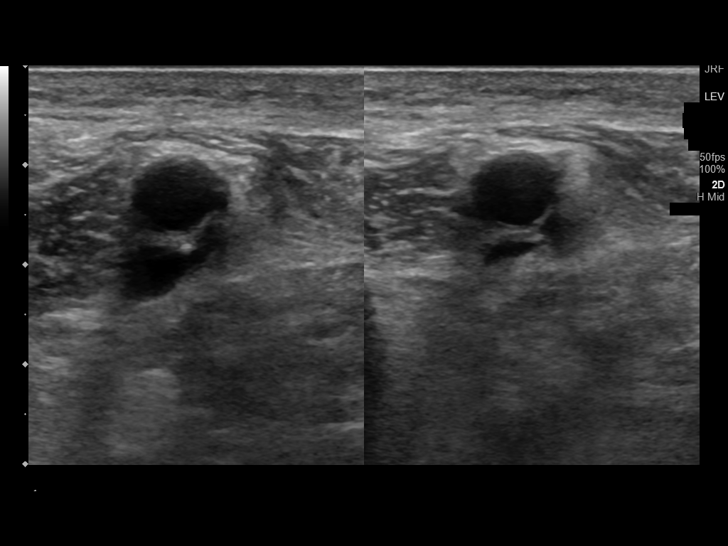
[im 48/58]
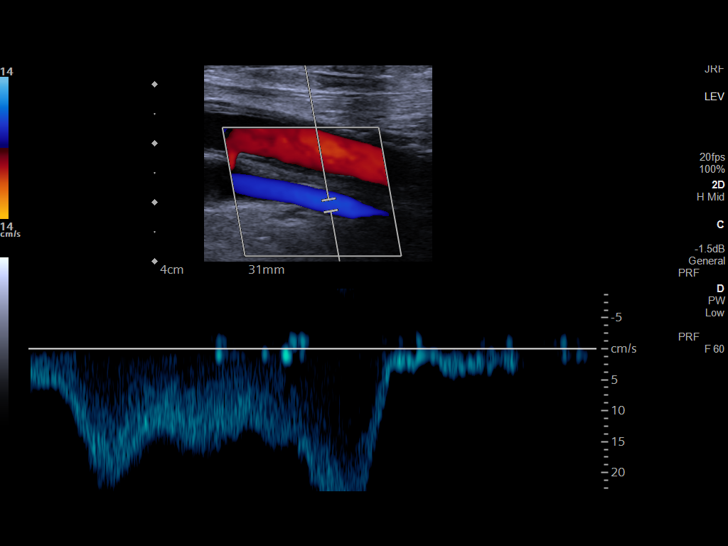
[im 53/58]
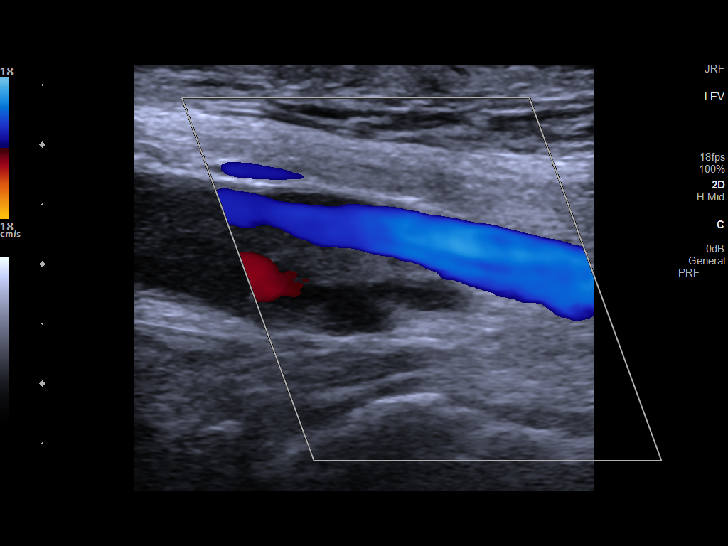
[im 58/58]
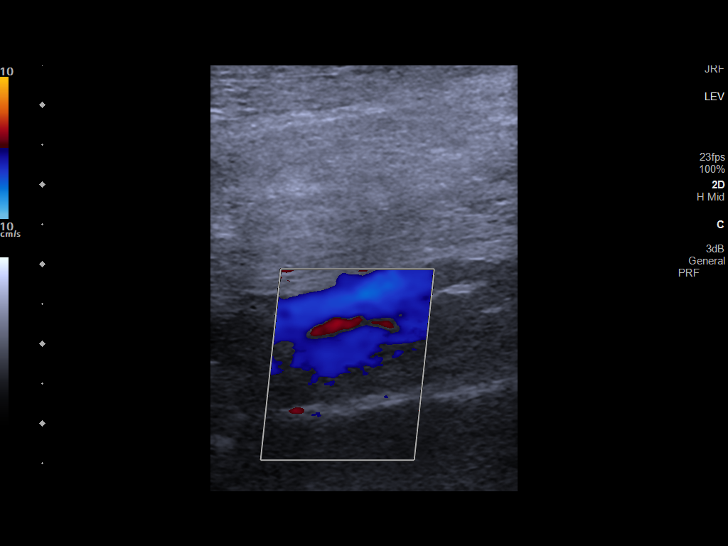

[13 of 24 positions shown; findings below may reference images not displayed]

FINDINGS: RIGHT LOWER EXTREMITY

Common Femoral Vein: No evidence of thrombus. Normal
compressibility, respiratory phasicity and response to augmentation.

Saphenofemoral Junction: No evidence of thrombus. Normal
compressibility and flow on color Doppler imaging.

Profunda Femoral Vein: No evidence of thrombus. Normal
compressibility and flow on color Doppler imaging.

Femoral Vein: No evidence of thrombus. Normal compressibility,
respiratory phasicity and response to augmentation.

Popliteal Vein: No evidence of thrombus. Normal compressibility,
respiratory phasicity and response to augmentation.

Calf Veins: No evidence of thrombus. Normal compressibility and flow
on color Doppler imaging.

Superficial Great Saphenous Vein: No evidence of thrombus. Normal
compressibility.

Venous Reflux:  None.

Other Findings:  None.

LEFT LOWER EXTREMITY

Common Femoral Vein: No evidence of thrombus. Normal
compressibility, respiratory phasicity and response to augmentation.

Saphenofemoral Junction: No evidence of thrombus. Normal
compressibility and flow on color Doppler imaging.

Profunda Femoral Vein: No evidence of thrombus. Normal
compressibility and flow on color Doppler imaging.

Femoral Vein: No evidence of thrombus. Normal compressibility,
respiratory phasicity and response to augmentation.

Popliteal Vein: No evidence of thrombus. Normal compressibility,
respiratory phasicity and response to augmentation.

Calf Veins: No evidence of thrombus. Normal compressibility and flow
on color Doppler imaging.

Superficial Great Saphenous Vein: No evidence of thrombus. Normal
compressibility.

Venous Reflux:  None.

Other Findings:  None.
IMPRESSION: No evidence of deep venous thrombosis in either lower extremity.
